# Patient Record
Sex: Female | Born: 1945 | Race: White | Hispanic: No | Marital: Married | Smoking: Former smoker
Health system: Southern US, Community
[De-identification: ages and names within clinical notes are randomized; demographics above are authoritative.]

## PROBLEM LIST (undated history)

## (undated) DIAGNOSIS — J189 Pneumonia, unspecified organism: Secondary | ICD-10-CM

## (undated) DIAGNOSIS — M1990 Unspecified osteoarthritis, unspecified site: Secondary | ICD-10-CM

## (undated) DIAGNOSIS — B029 Zoster without complications: Secondary | ICD-10-CM

## (undated) DIAGNOSIS — K219 Gastro-esophageal reflux disease without esophagitis: Secondary | ICD-10-CM

## (undated) DIAGNOSIS — Z9981 Dependence on supplemental oxygen: Secondary | ICD-10-CM

## (undated) DIAGNOSIS — J45909 Unspecified asthma, uncomplicated: Secondary | ICD-10-CM

## (undated) DIAGNOSIS — B0229 Other postherpetic nervous system involvement: Secondary | ICD-10-CM

## (undated) DIAGNOSIS — J449 Chronic obstructive pulmonary disease, unspecified: Secondary | ICD-10-CM

## (undated) DIAGNOSIS — G8929 Other chronic pain: Secondary | ICD-10-CM

## (undated) DIAGNOSIS — I10 Essential (primary) hypertension: Secondary | ICD-10-CM

## (undated) DIAGNOSIS — M545 Low back pain, unspecified: Secondary | ICD-10-CM

## (undated) DIAGNOSIS — E049 Nontoxic goiter, unspecified: Secondary | ICD-10-CM

## (undated) DIAGNOSIS — F419 Anxiety disorder, unspecified: Secondary | ICD-10-CM

## (undated) HISTORY — PX: CATARACT EXTRACTION W/ INTRAOCULAR LENS  IMPLANT, BILATERAL: SHX1307

## (undated) HISTORY — PX: TUBAL LIGATION: SHX77

## (undated) HISTORY — PX: WISDOM TOOTH EXTRACTION: SHX21

## (undated) HISTORY — DX: Chronic obstructive pulmonary disease, unspecified: J44.9

## (undated) HISTORY — PX: TONSILLECTOMY: SUR1361

## (undated) HISTORY — DX: Nontoxic goiter, unspecified: E04.9

## (undated) HISTORY — DX: Anxiety disorder, unspecified: F41.9

## (undated) HISTORY — DX: Essential (primary) hypertension: I10

---

## 2004-08-31 ENCOUNTER — Ambulatory Visit: Payer: Self-pay | Admitting: Internal Medicine

## 2006-10-11 ENCOUNTER — Ambulatory Visit: Payer: Self-pay | Admitting: Endocrinology

## 2006-10-14 ENCOUNTER — Ambulatory Visit: Payer: Self-pay | Admitting: Endocrinology

## 2006-10-29 ENCOUNTER — Ambulatory Visit: Payer: Self-pay | Admitting: Endocrinology

## 2006-11-13 ENCOUNTER — Encounter: Admission: RE | Admit: 2006-11-13 | Discharge: 2006-11-13 | Payer: Self-pay | Admitting: Endocrinology

## 2006-11-14 ENCOUNTER — Ambulatory Visit: Payer: Self-pay | Admitting: Endocrinology

## 2006-11-14 LAB — CONVERTED CEMR LAB
BUN: 16 mg/dL (ref 6–23)
CO2: 28 meq/L (ref 19–32)
Calcium: 9.7 mg/dL (ref 8.4–10.5)
Chloride: 100 meq/L (ref 96–112)
Glucose, Bld: 97 mg/dL (ref 70–99)

## 2006-11-20 ENCOUNTER — Ambulatory Visit: Payer: Self-pay | Admitting: Endocrinology

## 2006-11-25 ENCOUNTER — Other Ambulatory Visit: Admission: RE | Admit: 2006-11-25 | Discharge: 2006-11-25 | Payer: Self-pay | Admitting: Interventional Radiology

## 2006-11-25 ENCOUNTER — Encounter (INDEPENDENT_AMBULATORY_CARE_PROVIDER_SITE_OTHER): Payer: Self-pay | Admitting: Specialist

## 2006-11-25 ENCOUNTER — Encounter: Admission: RE | Admit: 2006-11-25 | Discharge: 2006-11-25 | Payer: Self-pay | Admitting: Endocrinology

## 2006-11-27 ENCOUNTER — Ambulatory Visit: Payer: Self-pay | Admitting: Endocrinology

## 2006-12-03 ENCOUNTER — Ambulatory Visit: Payer: Self-pay | Admitting: Internal Medicine

## 2006-12-05 ENCOUNTER — Ambulatory Visit: Payer: Self-pay | Admitting: Internal Medicine

## 2006-12-17 ENCOUNTER — Ambulatory Visit: Payer: Self-pay | Admitting: Internal Medicine

## 2007-05-16 ENCOUNTER — Ambulatory Visit: Payer: Self-pay | Admitting: Internal Medicine

## 2007-06-10 ENCOUNTER — Ambulatory Visit: Payer: Self-pay | Admitting: Internal Medicine

## 2007-07-04 ENCOUNTER — Ambulatory Visit: Payer: Self-pay | Admitting: Endocrinology

## 2007-07-04 LAB — CONVERTED CEMR LAB
Basophils Absolute: 0 10*3/uL (ref 0.0–0.1)
Chloride: 100 meq/L (ref 96–112)
Creatinine, Ser: 0.9 mg/dL (ref 0.4–1.2)
Eosinophils Absolute: 0.2 10*3/uL (ref 0.0–0.6)
Eosinophils Relative: 2.2 % (ref 0.0–5.0)
Glucose, Bld: 107 mg/dL — ABNORMAL HIGH (ref 70–99)
HCT: 36.6 % (ref 36.0–46.0)
Hemoglobin: 12.8 g/dL (ref 12.0–15.0)
MCHC: 35 g/dL (ref 30.0–36.0)
MCV: 92.4 fL (ref 78.0–100.0)
Monocytes Absolute: 0.9 10*3/uL — ABNORMAL HIGH (ref 0.2–0.7)
Neutrophils Relative %: 73 % (ref 43.0–77.0)
Potassium: 3.5 meq/L (ref 3.5–5.1)
RBC: 3.96 M/uL (ref 3.87–5.11)
RDW: 11.8 % (ref 11.5–14.6)
Rhuematoid fact SerPl-aCnc: <20 intl units/mL — ABNORMAL LOW (ref 0.0–20.0)
Sodium: 137 meq/L (ref 135–145)
Total CK: 171 units/L (ref 7–177)
WBC: 7.7 10*3/uL (ref 4.5–10.5)

## 2007-07-07 ENCOUNTER — Encounter: Payer: Self-pay | Admitting: Internal Medicine

## 2007-07-07 ENCOUNTER — Ambulatory Visit: Payer: Self-pay | Admitting: Internal Medicine

## 2007-07-07 ENCOUNTER — Observation Stay (HOSPITAL_COMMUNITY): Admission: AD | Admit: 2007-07-07 | Discharge: 2007-07-08 | Payer: Self-pay | Admitting: Internal Medicine

## 2007-07-07 DIAGNOSIS — I10 Essential (primary) hypertension: Secondary | ICD-10-CM | POA: Insufficient documentation

## 2007-07-07 DIAGNOSIS — J449 Chronic obstructive pulmonary disease, unspecified: Secondary | ICD-10-CM

## 2007-07-11 ENCOUNTER — Ambulatory Visit: Payer: Self-pay | Admitting: Internal Medicine

## 2007-07-11 ENCOUNTER — Encounter: Payer: Self-pay | Admitting: Internal Medicine

## 2007-07-11 DIAGNOSIS — R0989 Other specified symptoms and signs involving the circulatory and respiratory systems: Secondary | ICD-10-CM

## 2007-07-11 DIAGNOSIS — R0609 Other forms of dyspnea: Secondary | ICD-10-CM | POA: Insufficient documentation

## 2007-07-11 DIAGNOSIS — M62838 Other muscle spasm: Secondary | ICD-10-CM

## 2007-07-24 ENCOUNTER — Ambulatory Visit: Payer: Self-pay | Admitting: Internal Medicine

## 2007-07-24 ENCOUNTER — Encounter: Payer: Self-pay | Admitting: Internal Medicine

## 2007-07-24 DIAGNOSIS — L501 Idiopathic urticaria: Secondary | ICD-10-CM

## 2007-08-05 ENCOUNTER — Ambulatory Visit: Payer: Self-pay | Admitting: Internal Medicine

## 2007-08-05 LAB — CONVERTED CEMR LAB
BUN: 11 mg/dL (ref 6–23)
CO2: 27 meq/L (ref 19–32)
Chloride: 103 meq/L (ref 96–112)
Creatinine, Ser: 0.8 mg/dL (ref 0.4–1.2)
TSH: 0.4 microintl units/mL (ref 0.35–5.50)

## 2007-08-15 ENCOUNTER — Encounter: Payer: Self-pay | Admitting: Internal Medicine

## 2007-08-15 ENCOUNTER — Ambulatory Visit: Payer: Self-pay | Admitting: Internal Medicine

## 2007-11-27 ENCOUNTER — Ambulatory Visit: Payer: Self-pay | Admitting: Internal Medicine

## 2007-11-27 DIAGNOSIS — J069 Acute upper respiratory infection, unspecified: Secondary | ICD-10-CM | POA: Insufficient documentation

## 2008-01-20 ENCOUNTER — Telehealth: Payer: Self-pay | Admitting: Internal Medicine

## 2008-01-26 ENCOUNTER — Telehealth: Payer: Self-pay | Admitting: Internal Medicine

## 2008-02-18 ENCOUNTER — Ambulatory Visit: Payer: Self-pay | Admitting: Internal Medicine

## 2008-02-18 LAB — CONVERTED CEMR LAB
CO2: 27 meq/L (ref 19–32)
Chloride: 110 meq/L (ref 96–112)
Glucose, Bld: 88 mg/dL (ref 70–99)
Potassium: 3.9 meq/L (ref 3.5–5.1)
Sodium: 143 meq/L (ref 135–145)
TSH: 0.69 microintl units/mL (ref 0.35–5.50)

## 2008-02-20 ENCOUNTER — Encounter: Payer: Self-pay | Admitting: Internal Medicine

## 2008-02-23 LAB — CONVERTED CEMR LAB: Vit D, 1,25-Dihydroxy: 9 — ABNORMAL LOW (ref 30–89)

## 2008-02-26 ENCOUNTER — Ambulatory Visit: Payer: Self-pay | Admitting: Internal Medicine

## 2008-02-26 DIAGNOSIS — J309 Allergic rhinitis, unspecified: Secondary | ICD-10-CM | POA: Insufficient documentation

## 2008-05-12 ENCOUNTER — Ambulatory Visit: Payer: Self-pay | Admitting: Internal Medicine

## 2008-05-12 DIAGNOSIS — R209 Unspecified disturbances of skin sensation: Secondary | ICD-10-CM | POA: Insufficient documentation

## 2008-06-10 ENCOUNTER — Ambulatory Visit: Payer: Self-pay | Admitting: Internal Medicine

## 2008-06-10 DIAGNOSIS — E559 Vitamin D deficiency, unspecified: Secondary | ICD-10-CM

## 2008-06-14 ENCOUNTER — Telehealth: Payer: Self-pay | Admitting: Internal Medicine

## 2008-08-24 ENCOUNTER — Encounter: Payer: Self-pay | Admitting: Internal Medicine

## 2008-08-31 ENCOUNTER — Ambulatory Visit: Payer: Self-pay | Admitting: Internal Medicine

## 2008-08-31 LAB — CONVERTED CEMR LAB
CO2: 26 meq/L (ref 19–32)
Chloride: 103 meq/L (ref 96–112)
Creatinine, Ser: 1.4 mg/dL — ABNORMAL HIGH (ref 0.4–1.2)
GFR calc non Af Amer: 41 mL/min
Potassium: 4.1 meq/L (ref 3.5–5.1)
Sodium: 137 meq/L (ref 135–145)
Vit D, 1,25-Dihydroxy: 44 (ref 30–89)

## 2008-09-07 ENCOUNTER — Ambulatory Visit: Payer: Self-pay | Admitting: Internal Medicine

## 2008-09-07 DIAGNOSIS — J42 Unspecified chronic bronchitis: Secondary | ICD-10-CM | POA: Insufficient documentation

## 2008-09-07 DIAGNOSIS — B370 Candidal stomatitis: Secondary | ICD-10-CM

## 2008-09-21 ENCOUNTER — Ambulatory Visit: Payer: Self-pay | Admitting: Internal Medicine

## 2008-09-21 ENCOUNTER — Telehealth: Payer: Self-pay | Admitting: Internal Medicine

## 2008-09-29 ENCOUNTER — Telehealth: Payer: Self-pay | Admitting: Internal Medicine

## 2008-09-30 ENCOUNTER — Encounter (INDEPENDENT_AMBULATORY_CARE_PROVIDER_SITE_OTHER): Payer: Self-pay | Admitting: *Deleted

## 2008-10-04 ENCOUNTER — Encounter: Payer: Self-pay | Admitting: Internal Medicine

## 2008-10-04 ENCOUNTER — Ambulatory Visit: Payer: Self-pay | Admitting: Internal Medicine

## 2008-10-04 ENCOUNTER — Telehealth (INDEPENDENT_AMBULATORY_CARE_PROVIDER_SITE_OTHER): Payer: Self-pay | Admitting: *Deleted

## 2008-10-04 DIAGNOSIS — R05 Cough: Secondary | ICD-10-CM

## 2008-10-04 DIAGNOSIS — J209 Acute bronchitis, unspecified: Secondary | ICD-10-CM

## 2008-10-05 ENCOUNTER — Encounter: Payer: Self-pay | Admitting: Internal Medicine

## 2008-10-20 ENCOUNTER — Ambulatory Visit: Payer: Self-pay | Admitting: Internal Medicine

## 2008-10-20 DIAGNOSIS — E042 Nontoxic multinodular goiter: Secondary | ICD-10-CM

## 2008-11-08 ENCOUNTER — Encounter: Admission: RE | Admit: 2008-11-08 | Discharge: 2008-11-08 | Payer: Self-pay | Admitting: Internal Medicine

## 2008-11-11 ENCOUNTER — Telehealth: Payer: Self-pay | Admitting: Internal Medicine

## 2008-11-15 ENCOUNTER — Encounter (INDEPENDENT_AMBULATORY_CARE_PROVIDER_SITE_OTHER): Payer: Self-pay | Admitting: *Deleted

## 2008-11-22 ENCOUNTER — Ambulatory Visit: Payer: Self-pay | Admitting: Internal Medicine

## 2008-11-25 ENCOUNTER — Encounter: Payer: Self-pay | Admitting: Internal Medicine

## 2008-11-26 ENCOUNTER — Telehealth: Payer: Self-pay | Admitting: Internal Medicine

## 2008-12-02 ENCOUNTER — Ambulatory Visit: Payer: Self-pay | Admitting: Endocrinology

## 2008-12-02 DIAGNOSIS — R0602 Shortness of breath: Secondary | ICD-10-CM

## 2008-12-06 ENCOUNTER — Encounter: Payer: Self-pay | Admitting: Internal Medicine

## 2008-12-07 ENCOUNTER — Ambulatory Visit: Payer: Self-pay | Admitting: Internal Medicine

## 2008-12-10 ENCOUNTER — Telehealth: Payer: Self-pay | Admitting: Internal Medicine

## 2008-12-13 ENCOUNTER — Telehealth: Payer: Self-pay | Admitting: Internal Medicine

## 2008-12-13 ENCOUNTER — Ambulatory Visit: Payer: Self-pay | Admitting: Internal Medicine

## 2008-12-13 LAB — CONVERTED CEMR LAB
Chloride: 103 meq/L (ref 96–112)
Creatinine, Ser: 0.9 mg/dL (ref 0.4–1.2)
GFR calc non Af Amer: 68 mL/min
Sodium: 139 meq/L (ref 135–145)
TSH: 1.93 microintl units/mL (ref 0.35–5.50)

## 2008-12-17 ENCOUNTER — Encounter: Admission: RE | Admit: 2008-12-17 | Discharge: 2008-12-17 | Payer: Self-pay | Admitting: Otolaryngology

## 2008-12-22 ENCOUNTER — Encounter: Payer: Self-pay | Admitting: Internal Medicine

## 2008-12-29 ENCOUNTER — Ambulatory Visit (HOSPITAL_COMMUNITY): Admission: RE | Admit: 2008-12-29 | Discharge: 2008-12-30 | Payer: Self-pay | Admitting: Otolaryngology

## 2008-12-29 ENCOUNTER — Encounter (INDEPENDENT_AMBULATORY_CARE_PROVIDER_SITE_OTHER): Payer: Self-pay | Admitting: Otolaryngology

## 2008-12-29 HISTORY — PX: THYROIDECTOMY: SHX17

## 2009-01-12 ENCOUNTER — Ambulatory Visit: Payer: Self-pay | Admitting: Internal Medicine

## 2009-01-12 LAB — CONVERTED CEMR LAB: TSH: 1.27 microintl units/mL (ref 0.35–5.50)

## 2009-01-31 ENCOUNTER — Ambulatory Visit: Payer: Self-pay | Admitting: Cardiology

## 2009-01-31 ENCOUNTER — Inpatient Hospital Stay (HOSPITAL_COMMUNITY): Admission: EM | Admit: 2009-01-31 | Discharge: 2009-02-04 | Payer: Self-pay | Admitting: *Deleted

## 2009-01-31 ENCOUNTER — Ambulatory Visit: Payer: Self-pay | Admitting: Internal Medicine

## 2009-02-02 ENCOUNTER — Encounter: Payer: Self-pay | Admitting: Internal Medicine

## 2009-02-03 ENCOUNTER — Encounter: Payer: Self-pay | Admitting: Internal Medicine

## 2009-02-07 ENCOUNTER — Encounter: Payer: Self-pay | Admitting: Internal Medicine

## 2009-02-15 ENCOUNTER — Ambulatory Visit: Payer: Self-pay | Admitting: Internal Medicine

## 2009-02-15 ENCOUNTER — Encounter: Payer: Self-pay | Admitting: Internal Medicine

## 2009-02-17 ENCOUNTER — Telehealth (INDEPENDENT_AMBULATORY_CARE_PROVIDER_SITE_OTHER): Payer: Self-pay | Admitting: *Deleted

## 2009-02-18 ENCOUNTER — Encounter: Payer: Self-pay | Admitting: Internal Medicine

## 2009-02-21 ENCOUNTER — Telehealth (INDEPENDENT_AMBULATORY_CARE_PROVIDER_SITE_OTHER): Payer: Self-pay | Admitting: *Deleted

## 2009-03-04 ENCOUNTER — Ambulatory Visit: Payer: Self-pay | Admitting: Internal Medicine

## 2009-03-28 ENCOUNTER — Encounter: Payer: Self-pay | Admitting: Adult Health

## 2009-03-28 ENCOUNTER — Ambulatory Visit: Payer: Self-pay | Admitting: Internal Medicine

## 2009-04-25 ENCOUNTER — Ambulatory Visit: Payer: Self-pay | Admitting: Internal Medicine

## 2009-04-26 LAB — CONVERTED CEMR LAB
BUN: 14 mg/dL (ref 6–23)
Creatinine, Ser: 0.8 mg/dL (ref 0.4–1.2)
Eosinophils Relative: 8.5 % — ABNORMAL HIGH (ref 0.0–5.0)
GFR calc non Af Amer: 77.31 mL/min (ref >60)
HCT: 42.2 % (ref 36.0–46.0)
Monocytes Relative: 7.6 % (ref 3.0–12.0)
Neutrophils Relative %: 70 % (ref 43.0–77.0)
Platelets: 314 10*3/uL (ref 150.0–400.0)
Potassium: 3.5 meq/L (ref 3.5–5.1)
Pro B Natriuretic peptide (BNP): 27 pg/mL (ref 0.0–100.0)
RBC: 4.52 M/uL (ref 3.87–5.11)
TSH: 0.87 microintl units/mL (ref 0.35–5.50)
WBC: 8.6 10*3/uL (ref 4.5–10.5)

## 2009-05-10 ENCOUNTER — Ambulatory Visit: Payer: Self-pay | Admitting: Internal Medicine

## 2009-05-10 ENCOUNTER — Encounter: Payer: Self-pay | Admitting: Adult Health

## 2009-06-13 ENCOUNTER — Encounter (HOSPITAL_COMMUNITY): Admission: RE | Admit: 2009-06-13 | Discharge: 2009-09-11 | Payer: Self-pay | Admitting: Internal Medicine

## 2009-06-27 ENCOUNTER — Ambulatory Visit: Payer: Self-pay | Admitting: Internal Medicine

## 2009-09-05 ENCOUNTER — Telehealth (INDEPENDENT_AMBULATORY_CARE_PROVIDER_SITE_OTHER): Payer: Self-pay | Admitting: *Deleted

## 2009-09-10 ENCOUNTER — Ambulatory Visit: Payer: Self-pay | Admitting: Family Medicine

## 2009-09-12 ENCOUNTER — Encounter (HOSPITAL_COMMUNITY): Admission: RE | Admit: 2009-09-12 | Discharge: 2009-10-11 | Payer: Self-pay | Admitting: Internal Medicine

## 2009-09-15 ENCOUNTER — Telehealth: Payer: Self-pay | Admitting: Internal Medicine

## 2009-09-15 ENCOUNTER — Ambulatory Visit: Payer: Self-pay | Admitting: Internal Medicine

## 2009-09-15 LAB — CONVERTED CEMR LAB
Eosinophils Relative: 0.1 % (ref 0.0–5.0)
GFR calc non Af Amer: 59.68 mL/min (ref >60)
HCT: 44.4 % (ref 36.0–46.0)
Hemoglobin: 15.1 g/dL — ABNORMAL HIGH (ref 12.0–15.0)
Lymphs Abs: 0.8 10*3/uL (ref 0.7–4.0)
Monocytes Relative: 1.3 % — ABNORMAL LOW (ref 3.0–12.0)
Neutro Abs: 12.7 10*3/uL — ABNORMAL HIGH (ref 1.4–7.7)
Potassium: 4.3 meq/L (ref 3.5–5.1)
RBC: 4.56 M/uL (ref 3.87–5.11)
Sodium: 140 meq/L (ref 135–145)
TSH: 0.28 microintl units/mL — ABNORMAL LOW (ref 0.35–5.50)
WBC: 13.7 10*3/uL — ABNORMAL HIGH (ref 4.5–10.5)

## 2009-10-26 ENCOUNTER — Telehealth: Payer: Self-pay | Admitting: Family Medicine

## 2009-12-28 ENCOUNTER — Ambulatory Visit: Payer: Self-pay | Admitting: Internal Medicine

## 2009-12-28 LAB — CONVERTED CEMR LAB
Free T4: 0.7 ng/dL (ref 0.6–1.6)
T3, Free: 2.7 pg/mL (ref 2.3–4.2)

## 2010-02-08 ENCOUNTER — Ambulatory Visit: Payer: Self-pay | Admitting: Internal Medicine

## 2010-03-02 ENCOUNTER — Encounter: Payer: Self-pay | Admitting: Internal Medicine

## 2010-04-20 ENCOUNTER — Telehealth (INDEPENDENT_AMBULATORY_CARE_PROVIDER_SITE_OTHER): Payer: Self-pay | Admitting: *Deleted

## 2010-05-09 ENCOUNTER — Ambulatory Visit: Payer: Self-pay | Admitting: Internal Medicine

## 2010-05-16 ENCOUNTER — Ambulatory Visit: Payer: Self-pay | Admitting: Internal Medicine

## 2010-05-16 LAB — CONVERTED CEMR LAB
ALT: 29 units/L (ref 0–35)
AST: 22 units/L (ref 0–37)
Alkaline Phosphatase: 59 units/L (ref 39–117)
BUN: 30 mg/dL — ABNORMAL HIGH (ref 6–23)
Bilirubin, Direct: 0.1 mg/dL (ref 0.0–0.3)
Direct LDL: 108 mg/dL
Eosinophils Relative: 1.9 % (ref 0.0–5.0)
GFR calc non Af Amer: 30.22 mL/min (ref >60)
HCT: 35.8 % — ABNORMAL LOW (ref 36.0–46.0)
Leukocytes, UA: NEGATIVE
Lymphs Abs: 1.6 10*3/uL (ref 0.7–4.0)
Monocytes Relative: 8.2 % (ref 3.0–12.0)
Neutrophils Relative %: 71.3 % (ref 43.0–77.0)
Platelets: 350 10*3/uL (ref 150.0–400.0)
Potassium: 3.9 meq/L (ref 3.5–5.1)
Sodium: 137 meq/L (ref 135–145)
Specific Gravity, Urine: 1.01 (ref 1.000–1.030)
Total Bilirubin: 0.4 mg/dL (ref 0.3–1.2)
Total CHOL/HDL Ratio: 3
Urobilinogen, UA: 0.2 (ref 0.0–1.0)
VLDL: 57.2 mg/dL — ABNORMAL HIGH (ref 0.0–40.0)
WBC: 8.9 10*3/uL (ref 4.5–10.5)

## 2010-05-23 ENCOUNTER — Encounter: Payer: Self-pay | Admitting: Internal Medicine

## 2010-05-23 ENCOUNTER — Ambulatory Visit: Payer: Self-pay | Admitting: Internal Medicine

## 2010-07-07 ENCOUNTER — Ambulatory Visit: Payer: Self-pay | Admitting: Internal Medicine

## 2010-07-20 ENCOUNTER — Telehealth: Payer: Self-pay | Admitting: Internal Medicine

## 2010-08-08 ENCOUNTER — Telehealth (INDEPENDENT_AMBULATORY_CARE_PROVIDER_SITE_OTHER): Payer: Self-pay | Admitting: *Deleted

## 2010-08-25 ENCOUNTER — Telehealth (INDEPENDENT_AMBULATORY_CARE_PROVIDER_SITE_OTHER): Payer: Self-pay | Admitting: *Deleted

## 2010-10-05 ENCOUNTER — Ambulatory Visit: Payer: Self-pay | Admitting: Internal Medicine

## 2010-10-05 DIAGNOSIS — H269 Unspecified cataract: Secondary | ICD-10-CM | POA: Insufficient documentation

## 2010-10-05 DIAGNOSIS — J9611 Chronic respiratory failure with hypoxia: Secondary | ICD-10-CM

## 2010-10-30 ENCOUNTER — Encounter: Payer: Self-pay | Admitting: Internal Medicine

## 2010-10-30 ENCOUNTER — Ambulatory Visit
Admission: RE | Admit: 2010-10-30 | Discharge: 2010-10-30 | Payer: Self-pay | Source: Home / Self Care | Attending: Internal Medicine | Admitting: Internal Medicine

## 2010-11-03 IMAGING — CR DG CHEST 1V PORT
1 series · 1 of 1 positions shown · non-contrast
Comparison: 11/22/2008

CLINICAL DATA: Postop for thyroid mass.

PORTABLE CHEST - 1 VIEW

[AP]
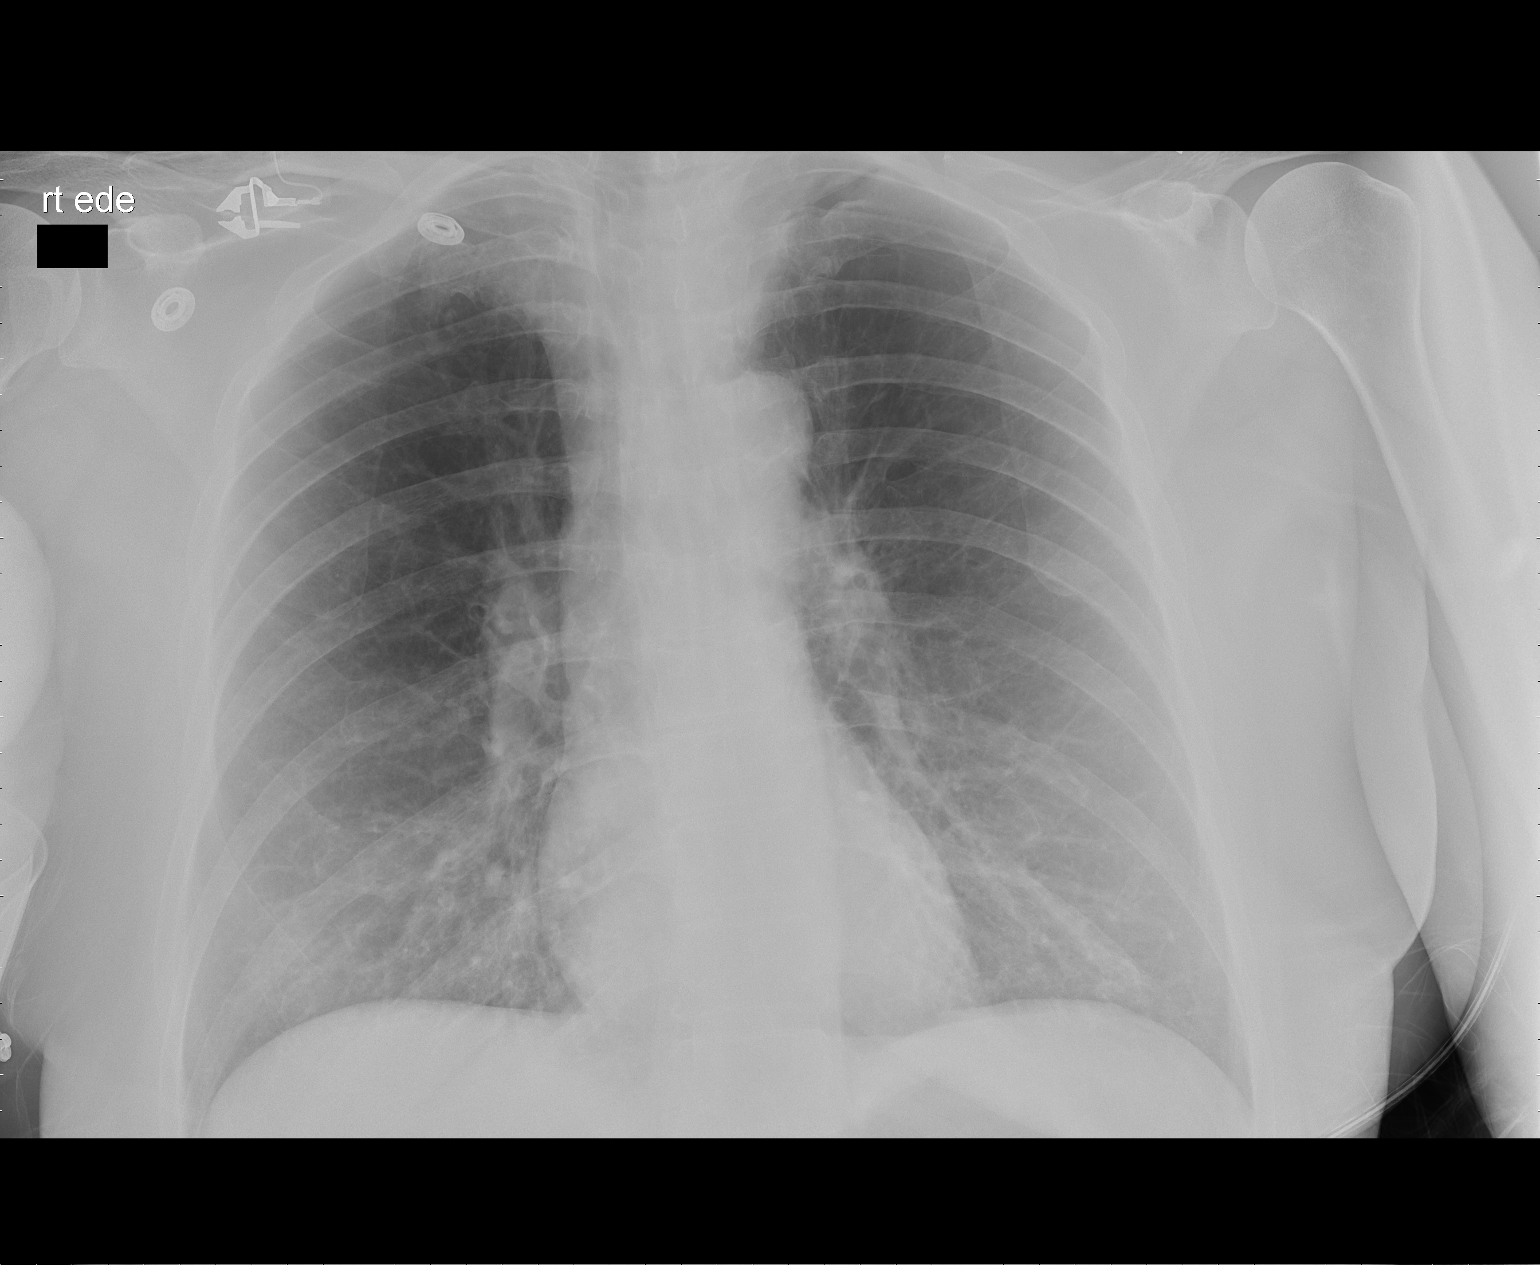

[1 of 1 positions shown; findings below may reference images not displayed]

FINDINGS: Left paratracheal fullness is less prominent than noted
on prior exam.  Less tracheal deviation to the right.
COPD/emphysema.  No acute cardiopulmonary findings.
IMPRESSION: Decrease in left paratracheal mass effect.  COPD/emphysema.  No
acute findings.

## 2010-11-09 ENCOUNTER — Ambulatory Visit
Admission: RE | Admit: 2010-11-09 | Discharge: 2010-11-09 | Payer: Self-pay | Source: Home / Self Care | Attending: Internal Medicine | Admitting: Internal Medicine

## 2010-11-14 NOTE — Progress Notes (Signed)
Summary: refill clotrimazole  Phone Note Refill Request Message from:  [facility] on August 25, 2010 3:50 PM  Refills Requested: Medication #1:  CLOTRIMAZOLE 10MG  TROCHE   Last Refilled: 06/28/2010 Pt requeted rx refill-please advise if okay to refill   Method Requested: Electronic Initial call taken by: Reynaldo Minium CMA,  August 25, 2010 3:52 PM  Follow-up for Phone Call        ok Follow-up by: Nyoka Cowden MD,  August 25, 2010 4:13 PM    New/Updated Medications: CLOTRIMAZOLE 10 MG TROC (CLOTRIMAZOLE) 1 by mouth 5 times per day x 7 days Prescriptions: CLOTRIMAZOLE 10 MG TROC (CLOTRIMAZOLE) 1 by mouth 5 times per day x 7 days  #35 x 0   Entered by:   Vernie Murders   Authorized by:   Nyoka Cowden MD   Signed by:   Vernie Murders on 08/25/2010   Method used:   [facility]        [facility]  [address] [id number]       [address]       [place], [place]  [address]       Ph: [phone] or [phone]       Fax: [phone]   RxID:   [id number]

## 2010-11-14 NOTE — Assessment & Plan Note (Signed)
Summary: Pulmonary/ ext ov with teaching add qvar 80 and pepcid at hs   Copy to:  DR. Posey Rea Primary Provider/Referring Provider:  Tresa Garter MD  CC:  Acute visit.  Pt c/o increased SOB x 2 days.  She states that she gets out of breath walking from room to room at home and just making her bed.  She also c/o dry cough getting worse this past wk..  History of Present Illness: 8  yowf  who quit smoking in December 2007, with copd with minimum asthmatic component   baseline = grocery store ok, not mall or superstore not needing handicap parking.  Feb 15, 2009 ov c/o sob onset Feb 8th ? goiter > goiter surgery  12/29/08 no better then ended back in the hosp 4/19 - 02/11/2009    better for the last week prior to ov in terms of doe on symbicort and prednsione taper, and   dry raspy cough and hoarseness come and go without pattern. Symbicort 2 puffs first thing  in am and 2 puffs again in pm about 12 hours later chase the am dose with spiriva if short of cough or coughing ok to use xopenex or neb every 4 hours Taper off prednsione as previously instructed.  May 10, 2009- Still on prednisone 20mg  . she has a ceiling of 20mg  and floor of 5 mg.   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 Had been able to participate in rehab and maintain on prednisone 10 onehalf daily and rare need for rescue.  Since worse,  increased prednsione 10 2 daily x one week but no better and therefore increased to 4 daily 11/28 and now yellow nasal drainage. rec doxy and work on [employer].  December 28, 2009 Acute visit.  Pt c/o increased SOB x 2 days.  She states that she gets out of breath walking from room to room at home and just making her bed.  She also c/o dry cough getting worse this past wk. Has found that pred taper to 10 mg per day tend to flare and now back up to 20 mg x 2 days. no purulent sputum.  Pt denies any significant sore  throat, dysphagia, itching, sneezing,  nasal congestion or excess secretions,  fever, chills, sweats, unintended wt loss, pleuritic or exertional cp, hempoptysis, change in activity tolerance  orthopnea pnd or leg swelling.  Pt also denies any obvious fluctuation in symptoms with weather or environmental change or other alleviating or aggravating factors.            Current Medications (verified): 1)  Spiriva Handihaler 18 Mcg  Caps (Tiotropium Bromide Monohydrate) .... Inhale Contents of 1 Capsule Once A Day 2)  Symbicort 160-4.5 Mcg/act Aero (Budesonide-Formoterol Fumarate) .... 2 Puffs First Thing  in Am and 2 Puffs Again in Pm About 12 Hours Later 3)  Vitamin D3 1000 Unit  Tabs (Cholecalciferol) .Marland Kitchen.. 1 By Mouth Daily 4)  Calcium Carbonate-Vitamin D 600-400 Mg-Unit  Tabs (Calcium Carbonate-Vitamin D) .... Take 1 Tablet By Mouth Two Times A Day 5)  Azor 5-40 Mg  Tabs (Amlodipine-Olmesartan) .Marland Kitchen.. 1 By Mouth Daily 6)  Prednisone 10 Mg  Tabs (Prednisone) .... Take As Directed or See Below For Tapering Instructions 7)  Omeprazole 40 Mg Cpdr (Omeprazole) .... Take One 30-60 Min Before First and Last Meals of The Day 8)  Albuterol Sulfate (2.5 Mg/35ml) 0.083%  Nebu (Albuterol Sulfate) .... One Every 4-6 Hours If Needed For An Emergency 9)  Proair Hfa 108 [phone] Base) Mcg/act  Aers (Albuterol Sulfate) .... 2 Puffs Every 4-6 Hours As Needed 10)  Oxygen .... 1.5 Liters As Needed 11)  Prednisone 20 Mg Tabs (Prednisone) .... 2 Tabs By Mouth in Am. 12)  Mucus Relief 400 Mg Tabs (Guaifenesin) .Marland Kitchen.. 1 Two Times A Day 13)  Hydrochlorothiazide 50 Mg Tabs (Hydrochlorothiazide) .... One Daily  Allergies (verified): No Known Drug Allergies  Past History:  Past Medical History: COPD    - PFT's 12/07/08  FEV1 1.26(61%) ratio 42,  5% resp to B2    - PFT's 02/03/09  FEV1 .88  (38%) ratio 40     - HFA technique 75% Mar 04, 2009 >90% April 25, 2009 > 90% December 28, 2009     - Prednsone maint April 25, 2009     -  Add on Qva 80 2 puffs first thing  in am and 2 puffs again in pm about 12 hours later  December 28, 2009  Hypertension Hives 07-2007 Allergic rhinitis    - Sinus ct 01/2009 wnl Goiter 2009 > resected 12/29/08 Health Maintenance......................................[facility] [place]     - Pneumovax September 15, 2009   Vital Signs:  Patient profile:   65 year old female Weight:      162 pounds O2 Sat:      95 % on Room air Temp:     97.9 degrees F oral Pulse rate:   99 / minute BP sitting:   118 / 64  (left arm)  Vitals Entered By: Vernie Murders (December 28, 2009 10:24 AM)  O2 Flow:  Room air  Physical Exam  Additional Exam:   ambulatory wf nad mininmally cushingnoid  wt 144 April 25, 2009 > 149 June 27, 2009  149 September 15, 2009 > 162 December 28, 2009  HEENT:edentullous, nl turbinates, Post pharynx w/ scattered white patches.  Nl external ear canals without cough reflex NECK :  without JVD/Nodes/TM/ nl carotid upstrokes bilaterally LUNGS: barrel chest, marked increased t exp CV:  RRR  no s3 or murmur or increase in P2, no edema   ABD:  soft and nontender with nl excursion in the supine position. No bruits or organomegaly, bowel sounds nl MS:  warm without deformities, calf tenderness, cyanosis or clubbing         Impression & Recommendations:  Problem # 1:  COPD (ICD-496)  Marked variability of fev1 documented previously indicating asthmatic component with variable Insp Capacity from air trapping translating to difficulty with hfa technique.  May eventually need to consider change to Brovana and Budesonide, the equivalent of Symbicort, per neb.    I spent extra time with the patient today explaining optimal mdi  technique.  This improved from  50-75% so tvar80 as add onry q  Appears prednisone dep for now as well.  The goal with a chronic steroid dependent illness is always arriving at the lowest effective dose that controls the disease/symptoms and not accepting a set  "formula" which is based on statistics that don't take into accound individual variability or the natural hx of the dz in every individual patient, which may well vary over time. For now ceiling is 20 and floor is 5mg  per day   Each maintenance medication was reviewed in detail including most importantly the difference between maintenance and as needed and under what circumstances the prns are to be used.  This was  done in the context of a medication calendar review which provided the patient with a user-friendly unambiguous mechanism for medication administration and reconciliation and provides an action plan for all active problems. It is critical that this be shown to every doctor  for modification during the office visit if necessary so the patient can use it as a working document.  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 2 puffs first thing  in am and 2 puffs again in pm about 12 hours later 2)  Pepcid Ac Maximum Strength 20 Mg Tabs (Famotidine) .... One at bedtime  Other Orders: TLB-TSH (Thyroid Stimulating Hormone) (84443-TSH) TLB-T3, Free (Triiodothyronine) (84481-T3FREE) TLB-T4 (Thyrox), Free [phone]) TLB-T3 Uptake (84479-T3UP) Est. Patient Level IV (66440)  Patient Instructions: 1)  Add pepcid 20 mg one at bedtime 2)  Work on perfecting inhaler technique:  relax and blow all the  way out then take a nice smooth deep breath back in, triggering the inhaler at same time you start breathing in  and hold for few secs 3)  Qvar 80 2 puffs first thing  in am and 2 puffs again in pm about 12 hours later  4)  Please schedule a follow-up appointment in 4- 6 weeks with pfts

## 2010-11-14 NOTE — Miscellaneous (Signed)
Summary: Orders Update pft charges  Clinical Lists Changes  Orders: Added new Service order of Carbon Monoxide diffusing w/capacity (94720) - Signed Added new Service order of Lung Volumes (94240) - Signed Added new Service order of Spirometry (Pre & Post) (94060) - Signed 

## 2010-11-14 NOTE — Progress Notes (Signed)
Summary: RX Proair  Phone Note Refill Request Call back at [phone] Message [facility]:  [facility] [address] on October 26, 2009 10:39 AM  Refills Requested: Medication #1:  PROAIR HFA 108 (90 BASE) MCG/ACT  AERS 2 puffs every 4-6 hours as needed   Last Refilled: 09/10/2009 Received faxed refill request.   Method Requested: Electronic Initial call taken by: Sydell Axon LPN,  October 26, 2009 10:40 AM  Follow-up for Phone Call        chart says this is plotnikov pt ? Follow-up by: Judith Part MD,  October 26, 2009 10:53 AM  Additional Follow-up for Phone Call Additional follow up Details #1::        Advised pharmacy. Additional Follow-up by: Lowella Petties CMA,  October 26, 2009 11:38 AM

## 2010-11-14 NOTE — Progress Notes (Signed)
Summary: Needs ov by end of month  Phone Note Call from Patient   Caller: Patient Call For: wert Summary of Call: pt's prescript are still under dr plotnikov would like dr wert to fill spiriva and azor seeing she no longer see dr Posey Rea [place] Initial call taken by: Rickard Patience,  April 20, 2010 3:18 PM  Follow-up for Phone Call        Spoke with pt.  Advised that we can refill her spiriva but her blood pressure med needs to be filled by her PCP.  She states that Dr Perley Jain is wanting her to come in for cpx for refills, looks like she last saw him Feb 2010.  She feels that she does not need to have appt with her PCP for refills "with all I have been through with Dr Sherene Sires".  I advised that MW is her pulm dr and does not follow her BP.  She then argued that we check her BP every time she has appt here and does not understand why we can't refill for her.  Please advise thanks! Follow-up by: Vernie Murders,  April 20, 2010 3:33 PM  Additional Follow-up for Phone Call Additional follow up Details #1::        best option is schedule ov with Tammy or me and give her enough rx to last until then and we can discuss it then but I can't refill this type med without addressing more than just cuff pressure (that wouldn't be quality care) - My notes say she was due to see Tammy by now anyway so that would be another option Additional Follow-up by: Nyoka Cowden MD,  April 20, 2010 4:16 PM    Additional Follow-up for Phone Call Additional follow up Details #2::    Pt already has appt with TP for 7/26. I advised that she keep this appt and this issue can be discussed at that time.  Pt verbalized understanding and states that Dr Posey Rea refilled med today so she will have enough to last until she is seen here. Rx refill for spiriva was sent. Follow-up by: Vernie Murders,  April 20, 2010 4:55 PM  Prescriptions: SPIRIVA HANDIHALER 18 MCG  CAPS (TIOTROPIUM BROMIDE MONOHYDRATE) Inhale contents of 1  capsule once a day  #30 x 5   Entered by:   Vernie Murders   Authorized by:   Nyoka Cowden MD   Signed by:   Vernie Murders on 04/20/2010   Method used:   [facility]        [facility] [id number]       [address]       [place], [place]  [address]       Ph: [phone] or [phone]       Fax: [phone]   RxID:   [id number]

## 2010-11-14 NOTE — Progress Notes (Signed)
Summary: increased SOB and cough- requests work excuse  Phone Note Call from Patient   Caller: Patient Call For: wert Summary of Call: need work note Initial call taken by: Rickard Patience,  August 08, 2010 8:14 AM  Follow-up for Phone Call        Spoke with pt.  She statest that she had to call out of work today due to having increased SOB, cough and fatigue.  Cough is dry- sometimes prod with clear sputum.  She states that her symptoms started over the w/e and so she has increased her pred to 20 mg daily and has been using her her 24/7.  She is requesting note to excuse her from work from today until 08/14/10.  I advised will need ov- she refused, stating that she is "too tired".  Would like to just have the note.  Pls advise thanks! Follow-up by: Vernie Murders,  August 08, 2010 9:22 AM  Additional Follow-up for Phone Call Additional follow up Details #1::        she has all the instructions she needs to handle a flare of her symptoms (her calendar has the action plan at the bottom which should serve as ruby slippers if she remembers my analogy)  if not effective needs ov with calendar and all active meds in hand.  ok for work note today but if not able to resume work tomorrow needs to come to office and let me troubleshoot the problem Additional Follow-up by: Nyoka Cowden MD,  August 08, 2010 9:47 AM    Additional Follow-up for Phone Call Additional follow up Details #2::    Spoke with pt and notified of the above recs per MW.  Pt does remember ruby slippers analogy and plans to follow her med cal.  She states that she does not need a work note for just being out 1 day.  She is due back to work on [employer].  I advised that if not improved by then, needs to call first thing in the am so we can get her in for appt.  Pt verbalized understanding. Follow-up by: Vernie Murders,  August 08, 2010 9:54 AM

## 2010-11-14 NOTE — Assessment & Plan Note (Signed)
Summary: Pulmomary/ ext ov with review of PFT's   Copy to:  DR. Posey Rea Primary Provider/Referring Provider:  Tresa Garter MD  CC:  4 wk followup with PFT's.  Pt states breathing is about the same- maybe some better with qvar.  She states that she is "getting over cold"- has prod cough with clear mucus.  She states that she needs to use her proair at least bid.  She is currently taking 5 mg prednisone daily.Marland Kitchen  History of Present Illness: 21  yowf  who quit smoking in December 2007, with copd with minimum asthmatic component   baseline = grocery store ok, not mall or superstore not needing handicap parking.  Feb 15, 2009 ov c/o sob onset Feb 8th ? goiter > goiter surgery  12/29/08 no better then ended back in the hosp 4/19 - 02/11/2009    better for the last week prior to ov in terms of doe on symbicort and prednsione taper, and   dry raspy cough and hoarseness come and go without pattern. Symbicort 2 puffs first thing  in am and 2 puffs again in pm about 12 hours later chase the am dose with spiriva if short of cough or coughing ok to use xopenex or neb every 4 hours Taper off prednsione as previously instructed.  May 10, 2009- Still on prednisone 20mg  . she has a ceiling of 20mg  and floor of 5 mg.   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 Had been able to participate in rehab and maintain on prednisone 10 onehalf daily and rare need for rescue.  Since worse,  increased prednsione 10 2 daily x one week but no better and therefore increased to 4 daily 11/28 and now yellow nasal drainage. rec doxy and work on [employer].  December 28, 2009 Acute visit.  Pt c/o increased SOB x 2 days.  She states that she gets out of breath walking from room to room at home and just making her bed.  She also c/o dry cough getting worse this past wk. Has found that pred taper to 10 mg per day tend to flare and now back up to 20 mg x 2  days. no purulent sputum.   February 08, 2010 4 wk followup with PFT's.  Pt states breathing is about the same- maybe some better with qvar.  She states that she is "getting over cold"- has prod cough with clear mucus.  She states that she needs to use her proair at least bid but that's better than before.  She is currently taking 5 mg prednisone daily. Pt denies any significant sore throat, dysphagia, itching, sneezing,  nasal congestion or excess secretions,  fever, chills, sweats, unintended wt los     Current Medications (verified): 1)  Spiriva Handihaler 18 Mcg  Caps (Tiotropium Bromide Monohydrate) .... Inhale Contents of 1 Capsule Once A Day 2)  Symbicort 160-4.5 Mcg/act Aero (Budesonide-Formoterol Fumarate) .... 2 Puffs First Thing  in Am and 2 Puffs Again in Pm About 12 Hours Later 3)  Vitamin D3 1000 Unit  Tabs (Cholecalciferol) .Marland Kitchen.. 1 By Mouth Daily 4)  Calcium Carbonate-Vitamin D 600-400 Mg-Unit  Tabs (Calcium Carbonate-Vitamin D) .... Take 1 Tablet By Mouth Two Times A Day 5)  Azor 5-40 Mg  Tabs (Amlodipine-Olmesartan) .Marland Kitchen.. 1 By Mouth Daily 6)  Prednisone 10 Mg  Tabs (Prednisone) .... Take As Directed or See Below For  Tapering Instructions 7)  Omeprazole 40 Mg Cpdr (Omeprazole) .... Take One 30-60 Min Before First and Last Meals of The Day 8)  Albuterol Sulfate (2.5 Mg/79ml) 0.083%  Nebu (Albuterol Sulfate) .... One Every 4-6 Hours If Needed For An Emergency 9)  Proair Hfa 108 [phone] Base) Mcg/act  Aers (Albuterol Sulfate) .... 2 Puffs Every 4-6 Hours As Needed 10)  Oxygen .... 1.5 Liters As Needed 11)  Prednisone 20 Mg Tabs (Prednisone) .... 2 Tabs By Mouth in Am. 12)  Mucus Relief 400 Mg Tabs (Guaifenesin) .Marland Kitchen.. 1 Two Times A Day 13)  Hydrochlorothiazide 50 Mg Tabs (Hydrochlorothiazide) .... One Daily 14)  Qvar 80 Mcg/act  Aers (Beclomethasone Dipropionate) .... 2 Puffs First Thing  in Am and 2 Puffs Again in Pm About 12 Hours Later 15)  Pepcid Ac Maximum Strength 20 Mg Tabs (Famotidine)  .... One At Bedtime  Allergies (verified): No Known Drug Allergies  Past History:  Past Medical History: COPD    - PFT's 12/07/08  FEV1 1.26(61%) ratio 42,  5% resp to B2    - PFT's 02/03/09  FEV1 .88  (38%) ratio 40     - PFT's 02/08/10 FEV1 1.30 (64%) ratio 45, 19% resp to B2 and DLC046% corrects to 63%    - HFA technique 75% Mar 04, 2009 >90% April 25, 2009 > 90% December 28, 2009     - 02 dep at hs since April 2010    - Prednsone maint April 25, 2009     - Add on Qva 80 2 puffs first thing  in am and 2 puffs again in pm about 12 hours later  December 28, 2009  Hypertension Hives 07-2007 Allergic rhinitis    - Sinus ct 01/2009 wnl Goiter 2009 > resected 12/29/08 Health Maintenance......................................[facility] [place]     - Pneumovax September 15, 2009   Vital Signs:  Patient profile:   65 year old female Weight:      159 pounds O2 Sat:      95 % on Room air Temp:     98.0 degrees F oral Pulse rate:   96 / minute BP sitting:   118 / 72  (left arm)  Vitals Entered By: Vernie Murders (February 08, 2010 11:40 AM)  O2 Flow:  Room air  Physical Exam  Additional Exam:   ambulatory wf nad mininmally cushingnoid  wt 144 April 25, 2009 > 149 June 27, 2009  149 September 15, 2009 > 162 December 28, 2009 > 159 February 08, 2010  HEENT:edentullous, nl turbinates, Post pharynx w/ scattered white patches.  Nl external ear canals without cough reflex NECK :  without JVD/Nodes/TM/ nl carotid upstrokes bilaterally LUNGS: barrel chest, marked increased t exp CV:  RRR  no s3 or murmur or increase in P2, no edema   ABD:  soft and nontender with nl excursion in the supine position. No bruits or organomegaly, bowel sounds nl MS:  warm without deformities, calf tenderness, cyanosis or clubbing         Impression & Recommendations:  Problem # 1:  COPD (ICD-496) Marked variability of fev1 documented previously indicating asthmatic component with variable Insp Capacity from air  trapping translating to difficulty with hfa technique> better after starting qvar 80 2 puffs first thing  in am and 2 puffs again in pm about 12 hours later   Appears prednisone dep for now as well.  The goal with a chronic steroid dependent illness is always arriving at the lowest  effective dose that controls the disease/symptoms and not accepting a set "formula" which is based on statistics that don't take into accound individual variability or the natural hx of the dz in every individual patient, which may well vary over time. For now ceiling is 20 and floor is 5mg  every other day.   Each maintenance medication was reviewed in detail including most importantly the difference between maintenance prns and under what circumstances the prns are to be used.  In addition, these two groups (for which the patient should keep up with refills) were distinguished from a third group :  meds that are used only short term with the intent to complete a course of therapy and then not refill them.  The med list was then fully reconciled and reorganized to reflect this important distinction.  Will need new med calendar to match our mar next ov  Other Orders: Est. Patient Level IV ([address])  Patient Instructions: 1)  See calendar for specific medication instructions and bring it back for each and every office visit for every healthcare provider you see.  Without it,  you may not receive the best quality medical care that we feel you deserve.  2)  Prednisone in one half every other day (even days) = floor  3)  See Tammy NP w/in 3 monthswith all your medications, even over the counter meds, separated in two separate bags, the ones you take no matter what vs the ones you stop once you feel better and take only as needed.  She will generate for you a new user friendly medication calendar that will put [place] all on the same page re: your medication use.  Prescriptions: PROAIR HFA 108 (90 BASE) MCG/ACT  AERS (ALBUTEROL SULFATE)  2 puffs every 4-6 hours as needed  #1 x 11   Entered and Authorized by:   Nyoka Cowden MD   Signed by:   Nyoka Cowden MD on 02/08/2010   Method used:   [facility]        [id number]       [address]       [place], [place]  [address]       Ph: [phone] or [phone]       Fax: [phone]   RxID:   [id number] QVAR 80 MCG/ACT  AERS (BECLOMETHASONE DIPROPIONATE) 2 puffs first thing  in am and 2 puffs again in pm about 12 hours later  #1 x 11   Entered and Authorized by:   Nyoka Cowden MD   Signed by:   Nyoka Cowden MD on 02/08/2010   Method used:   [facility]ically [facility]        [facility] [id number]       [address]       [place], [place]  [address]       Ph: [phone] or [phone]       Fax: [phone]   RxID:   [id number]

## 2010-11-14 NOTE — Assessment & Plan Note (Signed)
Summary: Pulmonary/ ext f/u with hfa teaching 50% effecive   Copy to:  DR. Posey Rea Primary Provider/Referring Provider:  Tresa Garter MD  CC:  Followup.  Pt c/o increased SOB and dry cough x 2 days.  She states that she got out of breath yesterday while making the bed and when she carried groceries inside. She is currently taking prednisone 5 mg daily.Marland Kitchen  History of Present Illness: 40  yowf  who quit smoking in December 2007, with copd with minimum asthmatic component   baseline = grocery store ok, not mall or superstore not needing handicap parking.  Feb 15, 2009 ov c/o sob onset Feb 8th ? goiter > goiter surgery  12/29/08 no better then ended back in the hosp 4/19 - 02/11/2009    better for the last week prior to ov in terms of doe on symbicort and prednsione taper, and   dry raspy cough and hoarseness come and go without pattern. Symbicort 2 puffs first thing  in am and 2 puffs again in pm about 12 hours later chase the am dose with spiriva if short of cough or coughing ok to use xopenex or neb every 4 hours Taper off prednsione as previously instructed.  May 10, 2009- Still on prednisone 20mg  . she has a ceiling of 20mg  and floor of 5 mg.   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 Had been able to participate in rehab and maintain on prednisone 10 onehalf daily and rare need for rescue.  Since worse,  increased prednsione 10 2 daily x one week but no better and therefore increased to 4 daily 11/28 and now yellow nasal drainage. rec doxy and work on [employer].     July 26, 2011ov/ med review.  She remains on prednisone 5mg  once daily flares with taper lower.  July 07, 2010  Followup.  Pt c/o increased SOB and dry cough x 2 days.  She states that she got out of breath yesterday while making the bed and when she carried groceries inside. She is currently taking prednisone 5 mg daily. Has not started the  prednisone ceiling yet as per med calendar, still getting relief with saba.  Pt denies any significant sore throat, dysphagia, itching, sneezing,  nasal congestion or excess secretions,  fever, chills, sweats, unintended wt loss, pleuritic or exertional cp, hempoptysis,    orthopnea pnd or leg swelling   Current Medications (verified): 1)  Spiriva Handihaler 18 Mcg  Caps (Tiotropium Bromide Monohydrate) .... Inhale Contents of 1 Capsule Once A Day 2)  Symbicort 160-4.5 Mcg/act Aero (Budesonide-Formoterol Fumarate) .... 2 Puffs First Thing  in Am and 2 Puffs Again in Pm About 12 Hours Later 3)  Hydrochlorothiazide 50 Mg Tabs (Hydrochlorothiazide) .... One Daily 4)  Vitamin D3 1000 Unit  Tabs (Cholecalciferol) .Marland Kitchen.. 1 By Mouth Daily 5)  Calcium Carbonate-Vitamin D 600-400 Mg-Unit  Tabs (Calcium Carbonate-Vitamin D) .... Take 1 Tablet By Mouth Two Times A Day 6)  Azor 5-40 Mg  Tabs (Amlodipine-Olmesartan) .Marland Kitchen.. 1 By Mouth Daily 7)  Prednisone 10 Mg  Tabs (Prednisone) .... Take As Directed or See Below For Tapering Instructions 8)  Omeprazole 40 Mg Cpdr (Omeprazole) .... Take One 30-60 Min Before First and Last Meals of The Day 9)  Oxygen .... 1.5 Liters As Needed 10)  Pepcid Ac Maximum Strength 20 Mg Tabs (Famotidine) .... One At Bedtime 11)  Qvar  80 Mcg/act  Aers (Beclomethasone Dipropionate) .... 2 Puffs First Thing  in Am and 2 Puffs Again in Pm About 12 Hours Later 12)  Albuterol Sulfate (2.5 Mg/84ml) 0.083%  Nebu (Albuterol Sulfate) .... One Every 4-6 Hours If Needed For An Emergency 13)  Proair Hfa 108 [phone] Base) Mcg/act  Aers (Albuterol Sulfate) .... 2 Puffs Every 4-6 Hours As Needed 14)  Mucus Relief 400 Mg Tabs (Guaifenesin) .Marland Kitchen.. 1 Two Times A Day 15)  Zyrtec Allergy 10 Mg Caps (Cetirizine Hcl) .Marland Kitchen.. 1 At Bedtime As Needed  Allergies (verified): No Known Drug Allergies  Past History:  Past Medical History: COPD    - PFT's 12/07/08  FEV1 1.26(61%) ratio 42,  5% resp to B2    - PFT's 02/03/09   FEV1 .88  (38%) ratio 40     - PFT's 02/08/10 FEV1 1.30 (64%) ratio 45, 19% resp to B2 and DLC046% corrects to 63%    - HFA technique 75% Mar 04, 2009 > 50% p coaching July 07, 2010     - 02 dep at hs since April 2010    - Prednsone maint April 25, 2009     - Add on Qva 80 2 puffs first thing  in am and 2 puffs again in pm about 12 hours later  December 28, 2009  Hypertension Hives 07-2007 Allergic rhinitis    - Sinus ct 01/2009 wnl Goiter 2009 > resected 12/29/08 Health Maintenance......................................[facility] [place]     - Pneumovax September 15, 2009  Complex med regimen--Meds reviewed with pt education and computerized med calendar May 09, 2010  Vital Signs:  Patient profile:   65 year old female Weight:      154.50 pounds O2 Sat:      96 % on Room air Temp:     98.1 degrees F oral Pulse rate:   102 / minute BP sitting:   122 / 80  (left arm)  Vitals Entered By: Vernie Murders (July 07, 2010 9:17 AM)  O2 Flow:  Room air  Physical Exam  Additional Exam:   ambulatory wf nad mininmally cushingnoid  wt 144 April 25, 2009 >  162 December 28, 2009 > 159 February 08, 2010 >>156 May 09, 2010 > 154 July 07, 2010  HEENT:edentullous, nl turbinates, Post pharynx w/ scattered white patches.  Nl external ear canals without cough reflex NECK :  without JVD/Nodes/TM/ nl carotid upstrokes bilaterally LUNGS: barrel chest, marked increased t exp CV:  RRR  no s3 or murmur or increase in P2, no edema   ABD:  soft and nontender with nl excursion in the supine position. No bruits or organomegaly, bowel sounds nl MS:  warm without deformities, calf tenderness, cyanosis or clubbing         Impression & Recommendations:  Problem # 1:  COPD (ICD-496)    DDX of  difficult airways managment all start with A and  include Adherence, Ace Inhibitors, Acid Reflux, Active Sinus Disease, Alpha 1 Antitripsin deficiency, Anxiety masquerading as Airways dz,  ABPA,  allergy(esp in  young), Aspiration (esp in elderly), Adverse effects of DPI,  Active smokers, plus one B  = Beta blocker use..   Adherence seems better but still not understanding the action plans at the bottom of the calendar.  I emphasized to the patient that just like Dorothy in [employer], she is already wearing "ruby slippers" she can click anytime she wants:  the answer to all her recurrent symptoms  is  already  literally at her fingertips:   all she has to do is refer to the medicine calendar we provided her  and her problems  are each addressed in a user-friendly format, reading from left to right for each symptoms she's likely to develop between office visits.    .I spent extra time with the patient today explaining optimal mdi  technique.  This improved from  25-50% but no better, needs work  Orders: Est. Patient Level IV (16109)  Patient Instructions: 1)  Work on inhaler technique:  relax and blow all the way out then take a nice smooth deep breath back in, triggering the inhaler at same time you start breathing in hold a few seconds then rinse and gargle 2)  New floor for Prednisone is 10 mg one half daily 3)  See calendar for specific medication instructions and bring it back for each and every office visit for every healthcare provider you see.  Without it,  you may not receive the best quality medical care that we feel you deserve. (remember Dorothy and the [employer]) 4)  Return to office in 3 months, sooner if needed  Prescriptions: SYMBICORT 160-4.5 MCG/ACT AERO (BUDESONIDE-FORMOTEROL FUMARATE) 2 puffs first thing  in am and 2 puffs again in pm about 12 hours later  #1 x 11   Entered and Authorized by:   Nyoka Cowden MD   Signed by:   Nyoka Cowden MD on 07/07/2010   Method used:   [id number]       [address]       [place], [place]  [address]       Ph: [phone] or [phone]       Fax: [phone]   RxID:    [id number]

## 2010-11-14 NOTE — Assessment & Plan Note (Signed)
Summary: CPX/ NWS  #   Vital Signs:  Patient profile:   65 year old female Height:      62 inches Weight:      153 pounds BMI:     28.09 O2 Sat:      90 % on Room air Temp:     98.4 degrees F oral Pulse rate:   99 / minute Pulse rhythm:   regular Resp:     20 per minute BP sitting:   120 / 70  (left arm) Cuff size:   regular  Vitals Entered By: Lanier Prude, CMA(AAMA) (May 23, 2010 10:30 AM)  O2 Flow:  Room air  Primary Care Provider:  Tresa Garter MD   History of Present Illness: The patient presents for a preventive health examination  C/o wt gain on Prednisone - 20 lbs C/o SOB She is on 5 mg of Prednisone now  Current Medications (verified): 1)  Spiriva Handihaler 18 Mcg  Caps (Tiotropium Bromide Monohydrate) .... Inhale Contents of 1 Capsule Once A Day 2)  Symbicort 160-4.5 Mcg/act Aero (Budesonide-Formoterol Fumarate) .... 2 Puffs First Thing  in Am and 2 Puffs Again in Pm About 12 Hours Later 3)  Hydrochlorothiazide 50 Mg Tabs (Hydrochlorothiazide) .... One Daily 4)  Vitamin D3 1000 Unit  Tabs (Cholecalciferol) .Marland Kitchen.. 1 By Mouth Daily 5)  Calcium Carbonate-Vitamin D 600-400 Mg-Unit  Tabs (Calcium Carbonate-Vitamin D) .... Take 1 Tablet By Mouth Two Times A Day 6)  Azor 5-40 Mg  Tabs (Amlodipine-Olmesartan) .Marland Kitchen.. 1 By Mouth Daily 7)  Prednisone 10 Mg  Tabs (Prednisone) .... Take As Directed or See Below For Tapering Instructions 8)  Omeprazole 40 Mg Cpdr (Omeprazole) .... Take One 30-60 Min Before First and Last Meals of The Day 9)  Oxygen .... 1.5 Liters As Needed 10)  Pepcid Ac Maximum Strength 20 Mg Tabs (Famotidine) .... One At Bedtime 11)  Qvar 80 Mcg/act  Aers (Beclomethasone Dipropionate) .... 2 Puffs First Thing  in Am and 2 Puffs Again in Pm About 12 Hours Later 12)  Albuterol Sulfate (2.5 Mg/80ml) 0.083%  Nebu (Albuterol Sulfate) .... One Every 4-6 Hours If Needed For An Emergency 13)  Proair Hfa 108 [phone] Base) Mcg/act  Aers (Albuterol Sulfate) .... 2  Puffs Every 4-6 Hours As Needed 14)  Mucus Relief 400 Mg Tabs (Guaifenesin) .Marland Kitchen.. 1 Two Times A Day 15)  Zyrtec Allergy 10 Mg Caps (Cetirizine Hcl) .Marland Kitchen.. 1 At Bedtime As Needed  Allergies (verified): No Known Drug Allergies  Past History:  Past Medical History: Last updated: 05/09/2010 COPD    - PFT's 12/07/08  FEV1 1.26(61%) ratio 42,  5% resp to B2    - PFT's 02/03/09  FEV1 .88  (38%) ratio 40     - PFT's 02/08/10 FEV1 1.30 (64%) ratio 45, 19% resp to B2 and DLC046% corrects to 63%    - HFA technique 75% Mar 04, 2009 >90% April 25, 2009 > 90% December 28, 2009     - 02 dep at hs since April 2010    - Prednsone maint April 25, 2009     - Add on Qva 80 2 puffs first thing  in am and 2 puffs again in pm about 12 hours later  December 28, 2009  Hypertension Hives 07-2007 Allergic rhinitis    - Sinus ct 01/2009 wnl Goiter 2009 > resected 12/29/08 Health Maintenance......................................[facility] [place]     - Pneumovax September 15, 2009  Complex med regimen--Meds reviewed with  pt education and computerized med calendar May 09, 2010  Past Surgical History: Last updated: 02/15/2009 Tubal ligation Tonsillectomy wisdom teeth extracted Thyroidectomy  12/29/08  Family History: Last updated: 02/15/2009 Family History Hypertension neg resp dz  Social History: Married x 25 years. One child - son, 28. 2 grandchildren works at [employer] as a Conservation officer, nature 3 d/wk Former Smoker 2007, husband is smoking in the house  Review of Systems       The patient complains of weight gain, dyspnea on exertion, and prolonged cough.  The patient denies anorexia, fever, weight loss, vision loss, decreased hearing, hoarseness, chest pain, syncope, peripheral edema, headaches, hemoptysis, abdominal pain, melena, hematochezia, severe indigestion/heartburn, hematuria, incontinence, genital sores, muscle weakness, suspicious skin lesions, transient blindness, difficulty walking, depression, unusual weight  change, abnormal bleeding, enlarged lymph nodes, angioedema, and breast masses.    Physical Exam  General:  Well-developed,well-nourished,in no acute distress; alert,appropriate and cooperative throughout examination, mildly hoarse, pulse ov 98% WITH NO EXERTION.overweight-appearing.   Head:  Normocephalic and atraumatic without cushingoid face. No apparent alopecia or balding. Eyes:  No corneal or conjunctival inflammation noted. EOMI. Perrla. Funduscopic exam benign, without hemorrhages, exudates or papilledema. Vision grossly normal. Ears:  External ear exam shows no significant lesions or deformities.  Otoscopic examination reveals clear canals, tympanic membranes are intact bilaterally without bulging, retraction, inflammation or discharge. Hearing is grossly normal bilaterally. Nose:  External nasal examination shows no deformity or inflammation. Nasal mucosa are pink and moist without lesions or exudates. Mouth:  Oral mucosa and oropharynx without lesions or exudates.  Teeth in good repair. Neck:  No deformities, masses, or tenderness noted. Chest Wall:  No deformities, masses, or tenderness noted. Lungs:  Normal respiratory effort, chest expands symmetrically. Lungs are clear to auscultation, no crackles or wheezes. Heart:  Normal rate and regular rhythm. S1 and S2 normal without gallop, murmur, click, rub or other extra sounds. Abdomen:  Bowel sounds positive,abdomen soft and non-tender without masses, organomegaly or hernias noted. Msk:  No deformity or scoliosis noted of thoracic or lumbar spine.   Pulses:  R and L carotid,radial,femoral,dorsalis pedis and posterior tibial pulses are full and equal bilaterally Extremities:  No clubbing, cyanosis, edema, or deformity noted with normal full range of motion of all joints.   Neurologic:  No cranial nerve deficits noted. Station and gait are normal. Plantar reflexes are down-going bilaterally. DTRs are symmetrical throughout. Sensory, motor  and coordinative functions appear intact. Skin:  Intact without suspicious lesions or rashes Cervical Nodes:  No lymphadenopathy noted Inguinal Nodes:  No significant adenopathy Psych:  Cognition and judgment appear intact. Alert and cooperative with normal attention span and concentration. No apparent delusions, illusions, hallucinations   Impression & Recommendations:  Problem # 1:  Preventive Health Care (ICD-V70.0) Assessment New The labs were reviewed with the patient.  Health and age related issues were discussed. Available screening tests and vaccinations were discussed as well. Healthy life style including good diet and exercise was discussed.  GYN q 12 months  Mammo, colon, BDS discussed  Problem # 2:  COPD (ICD-496) Assessment: Improved  Her updated medication list for this problem includes:    Spiriva Handihaler 18 Mcg Caps (Tiotropium bromide monohydrate) ..... Inhale contents of 1 capsule once a day    Symbicort 160-4.5 Mcg/act Aero (Budesonide-formoterol fumarate) .Marland Kitchen... 2 puffs first thing  in am and 2 puffs again in pm about 12 hours later    Qvar 80 Mcg/act Aers (Beclomethasone dipropionate) .Marland Kitchen... 2 puffs first thing  in am and 2 puffs again in pm about 12 hours later    Albuterol Sulfate (2.5 Mg/80ml) 0.083% Nebu (Albuterol sulfate) ..... One every 4-6 hours if needed for an emergency    Proair Hfa 108 (90 Base) Mcg/act Aers (Albuterol sulfate) .Marland Kitchen... 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 Inhale contents of 1 capsule once a day 2)  Symbicort 160-4.5 Mcg/act Aero (Budesonide-formoterol fumarate) .... 2 puffs first thing  in am and 2 puffs again in pm about 12 hours later 3)  Hydrochlorothiazide 50 Mg Tabs (Hydrochlorothiazide) .... One daily 4)  Vitamin D3 1000 Unit Tabs (Cholecalciferol) .Marland Kitchen.. 1 by mouth  daily 5)  Calcium Carbonate-vitamin D 600-400 Mg-unit Tabs (Calcium carbonate-vitamin d) .... Take 1 tablet by mouth two times a day 6)  Azor 5-40 Mg Tabs (Amlodipine-olmesartan) .Marland Kitchen.. 1 by mouth daily 7)  Prednisone 10 Mg Tabs (Prednisone) .... Take as directed or see below for tapering instructions 8)  Omeprazole 40 Mg Cpdr (Omeprazole) .... Take one 30-60 min before first and last meals of the day 9)  Oxygen  .... 1.5 liters as needed 10)  Pepcid Ac Maximum Strength 20 Mg Tabs (Famotidine) .... One at bedtime 11)  Qvar 80 Mcg/act Aers (Beclomethasone dipropionate) .... 2 puffs first thing  in am and 2 puffs again in pm about 12 hours later 12)  Albuterol Sulfate (2.5 Mg/25ml) 0.083% Nebu (Albuterol sulfate) .... One every 4-6 hours if needed for an emergency 13)  Proair Hfa 108 (90 Base) Mcg/act Aers (Albuterol sulfate) .... 2 puffs every 4-6 hours as needed 14)  Mucus Relief 400 Mg Tabs (Guaifenesin) .Marland Kitchen.. 1 two times a day 15)  Zyrtec Allergy 10 Mg Caps (Cetirizine hcl) .Marland Kitchen.. 1 at bedtime as needed  Other Orders: EKG w/ Interpretation (93000)  Patient Instructions: 1)  Please schedule a follow-up appointment in 1 year well w/labs. Prescriptions: AZOR 5-40 MG  TABS (AMLODIPINE-OLMESARTAN) 1 by mouth daily  #90 x 3   Entered and Authorized by:   Tresa Garter MD   Signed by:   Tresa Garter MD on 05/23/2010   Method used:   Print then Give to Patient   RxID:   [id number] AZOR 5-40 MG  TABS (AMLODIPINE-OLMESARTAN) 1 by mouth daily  #30 Tablet x 12   Entered and Authorized by:   Tresa Garter MD   Signed by:   Tresa Garter MD on 05/23/2010   Method used:   [facility]        [id number]       [address]       [place], [place]  [address]       Ph: [phone] or [phone]       Fax: [phone]   RxID:   [id number]

## 2010-11-14 NOTE — Progress Notes (Signed)
Summary: Needs appt  ---- Converted from flag ---- ---- 04/20/2010 3:03 PM, Lanier Prude, CMA(AAMA) wrote:   ---- 04/20/2010 3:03 PM, Ivar Bury wrote: Per pt:  Do not desire appt at this time.  She sees Dr Sherene Sires and Rubye Oaks.--phone  ---- 04/20/2010 11:40 AM, Lanier Prude, CMA(AAMA) wrote: Please schedule pt for OV.   Thanks ------------------------------

## 2010-11-14 NOTE — Letter (Signed)
Summary: Prescriber Response Form/[facility]  Prescriber Response Form/[facility]   Imported By: Sherian Rein 03/06/2010 14:14:50  _____________________________________________________________________  External Attachment:    Type:   Image     Comment:   External Document

## 2010-11-14 NOTE — Assessment & Plan Note (Signed)
Summary: NP follow up - med calendar   Copy to:  DR. Posey Rea Primary Provider/Referring Provider:  Tresa Garter MD  CC:  est med calendar - pt brought all meds with her today.  states having increased SOB w/ activity d/t the heat and humidity..  History of Present Illness: 65  yowf  who quit smoking in December 2007, with copd with minimum asthmatic component   baseline = grocery store ok, not mall or superstore not needing handicap parking.  Feb 15, 2009 ov c/o sob onset Feb 8th ? goiter > goiter surgery  12/29/08 no better then ended back in the hosp 4/19 - 02/11/2009    better for the last week prior to ov in terms of doe on symbicort and prednsione taper, and   dry raspy cough and hoarseness come and go without pattern. Symbicort 2 puffs first thing  in am and 2 puffs again in pm about 12 hours later chase the am dose with spiriva if short of cough or coughing ok to use xopenex or neb every 4 hours Taper off prednsione as previously instructed.  May 10, 2009- Still on prednisone 20mg  . she has a ceiling of 20mg  and floor of 5 mg.   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 Had been able to participate in rehab and maintain on prednisone 10 onehalf daily and rare need for rescue.  Since worse,  increased prednsione 10 2 daily x one week but no better and therefore increased to 4 daily 11/28 and now yellow nasal drainage. rec doxy and work on [employer].  December 28, 2009 Acute visit.  Pt c/o increased SOB x 2 days.  She states that she gets out of breath walking from room to room at home and just making her bed.  She also c/o dry cough getting worse this past wk. Has found that pred taper to 10 mg per day tend to flare and now back up to 20 mg x 2 days. no purulent sputum.  pril 27, 2011 4 wk followup with PFT's.  Pt states breathing is about the same- maybe some better with qvar.  She states that she is  "getting over cold"- has prod cough with clear mucus.  She states that she needs to use her proair at least bid but that's better than before.  She is currently taking 5 mg prednisone daily.   May 09, 2010--Presents for follow up and med review.  Can not tolerate the high heat and humidity. Has to stay in the air conditioning. No flare of symptoms unless she gets overheated. We reviewed her meds and updated her med calendar. She remains on prednisone 5mg  once daily. Denies chest pain,  orthopnea, hemoptysis, fever, n/v/d, edema, headache.   Medications Prior to Update: 1)  Spiriva Handihaler 18 Mcg  Caps (Tiotropium Bromide Monohydrate) .... Inhale Contents of 1 Capsule Once A Day 2)  Symbicort 160-4.5 Mcg/act Aero (Budesonide-Formoterol Fumarate) .... 2 Puffs First Thing  in Am and 2 Puffs Again in Pm About 12 Hours Later 3)  Vitamin D3 1000 Unit  Tabs (Cholecalciferol) .Marland Kitchen.. 1 By Mouth Daily 4)  Calcium Carbonate-Vitamin D 600-400 Mg-Unit  Tabs (Calcium Carbonate-Vitamin D) .... Take 1 Tablet By Mouth Two Times A Day 5)  Azor 5-40 Mg  Tabs (Amlodipine-Olmesartan) .Marland Kitchen.. 1 By Mouth Daily 6)  Prednisone 10 Mg  Tabs (Prednisone) .... Take As Directed  or See Below For Tapering Instructions 7)  Omeprazole 40 Mg Cpdr (Omeprazole) .... Take One 30-60 Min Before First and Last Meals of The Day 8)  Oxygen .... 1.5 Liters As Needed 9)  Hydrochlorothiazide 50 Mg Tabs (Hydrochlorothiazide) .... One Daily 10)  Pepcid Ac Maximum Strength 20 Mg Tabs (Famotidine) .... One At Bedtime 11)  Albuterol Sulfate (2.5 Mg/46ml) 0.083%  Nebu (Albuterol Sulfate) .... One Every 4-6 Hours If Needed For An Emergency 12)  Qvar 80 Mcg/act  Aers (Beclomethasone Dipropionate) .... 2 Puffs First Thing  in Am and 2 Puffs Again in Pm About 12 Hours Later 13)  Proair Hfa 108 (90 Base) Mcg/act  Aers (Albuterol Sulfate) .... 2 Puffs Every 4-6 Hours As Needed 14)  Mucus Relief 400 Mg Tabs (Guaifenesin) .Marland Kitchen.. 1 Two Times A Day  Allergies  (verified): No Known Drug Allergies  Past History:  Past Surgical History: Last updated: 02/15/2009 Tubal ligation Tonsillectomy wisdom teeth extracted Thyroidectomy  12/29/08  Family History: Last updated: 02/15/2009 Family History Hypertension neg resp dz  Social History: Last updated: 05/12/2008 Married x 25 years. One child - son, 49. 2 grandchildren works at [employer] as a Conservation officer, nature Former Smoker 2007, husband is smoking in the house  Risk Factors: Alcohol Use: <1 (07/07/2007) Exercise: no (10/04/2008)  Risk Factors: Smoking Status: quit (05/12/2008) Passive Smoke Exposure: no (10/04/2008)  Past Medical History: COPD    - PFT's 12/07/08  FEV1 1.26(61%) ratio 42,  5% resp to B2    - PFT's 02/03/09  FEV1 .88  (38%) ratio 40     - PFT's 02/08/10 FEV1 1.30 (64%) ratio 45, 19% resp to B2 and DLC046% corrects to 63%    - HFA technique 75% Mar 04, 2009 >90% April 25, 2009 > 90% December 28, 2009     - 02 dep at hs since April 2010    - Prednsone maint April 25, 2009     - Add on Qva 80 2 puffs first thing  in am and 2 puffs again in pm about 12 hours later  December 28, 2009  Hypertension Hives 07-2007 Allergic rhinitis    - Sinus ct 01/2009 wnl Goiter 2009 > resected 12/29/08 Health Maintenance......................................[facility] [place]     - Pneumovax September 15, 2009  Complex med regimen--Meds reviewed with pt education and computerized med calendar May 09, 2010  Vital Signs:  Patient profile:   65 year old female Height:      62 inches Weight:      156 pounds BMI:     28.64 O2 Sat:      96 % on Room air Temp:     99.7 degrees F oral Pulse rate:   90 / minute BP sitting:   122 / 70  (right arm) Cuff size:   regular  Vitals Entered By: Boone Master CNA/MA (May 09, 2010 9:58 AM)  O2 Flow:  Room air CC: est med calendar - pt brought all meds with her today.  states having increased SOB w/ activity d/t the heat and humidity. Is Patient Diabetic?  No Comments Medications reviewed with patient Daytime contact number verified with patient. Boone Master CNA/MA  May 09, 2010 9:58 AM    Physical Exam  Additional Exam:   ambulatory wf nad mininmally cushingnoid  wt 144 April 25, 2009 > 149 June 27, 2009  149 September 15, 2009 > 162 December 28, 2009 > 159 February 08, 2010 >>156 May 09, 2010  HEENT:edentullous, nl turbinates, Post  pharynx w/ scattered white patches.  Nl external ear canals without cough reflex NECK :  without JVD/Nodes/TM/ nl carotid upstrokes bilaterally LUNGS: barrel chest, marked increased t exp CV:  RRR  no s3 or murmur or increase in P2, no edema   ABD:  soft and nontender with nl excursion in the supine position. No bruits or organomegaly, bowel sounds nl MS:  warm without deformities, calf tenderness, cyanosis or clubbing         Impression & Recommendations:  Problem # 1:  COPD (ICD-496)  compensated on present regimen.  she remains stable w/ additon of QVAR, have been able to decrease steroids to 5mg  once daily  will aim to further titrate down w/ hopes to taper off at some point if stable on return will evaluate.  REC:  Follow med calendar closely and birng to each visit.  Taper prednisone as recommended on calendar.  follow up with Dr. Valerie Salts in few weeks.  follow up Dr. Sherene Sires in 2-3 months   Orders: Est. Patient Level IV (54270)  Problem # 2:  CANDIDIASIS, ORAL (ICD-112.0)  Mycelex troches 5 x day for 7 days Brush/rinse and gargle after inhaler use.   Orders: Est. Patient Level IV (62376)  Medications Added to Medication List This Visit: 1)  Mycelex 10 Mg Troc (Clotrimazole) .Marland Kitchen.. 1 fives times a day for 7 days for thrush  Complete Medication List: 1)  Spiriva Handihaler 18 Mcg Caps (Tiotropium bromide monohydrate) .... Inhale contents of 1 capsule once a day 2)  Symbicort 160-4.5 Mcg/act Aero (Budesonide-formoterol fumarate) .... 2 puffs first thing  in am and 2 puffs again in pm about  12 hours later 3)  Vitamin D3 1000 Unit Tabs (Cholecalciferol) .Marland Kitchen.. 1 by mouth daily 4)  Calcium Carbonate-vitamin D 600-400 Mg-unit Tabs (Calcium carbonate-vitamin d) .... Take 1 tablet by mouth two times a day 5)  Azor 5-40 Mg Tabs (Amlodipine-olmesartan) .Marland Kitchen.. 1 by mouth daily 6)  Prednisone 10 Mg Tabs (Prednisone) .... Take as directed or see below for tapering instructions 7)  Omeprazole 40 Mg Cpdr (Omeprazole) .... Take one 30-60 min before first and last meals of the day 8)  Oxygen  .... 1.5 liters as needed 9)  Hydrochlorothiazide 50 Mg Tabs (Hydrochlorothiazide) .... One daily 10)  Pepcid Ac Maximum Strength 20 Mg Tabs (Famotidine) .... One at bedtime 11)  Albuterol Sulfate (2.5 Mg/53ml) 0.083% Nebu (Albuterol sulfate) .... One every 4-6 hours if needed for an emergency 12)  Qvar 80 Mcg/act Aers (Beclomethasone dipropionate) .... 2 puffs first thing  in am and 2 puffs again in pm about 12 hours later 13)  Proair Hfa 108 (90 Base) Mcg/act Aers (Albuterol sulfate) .... 2 puffs every 4-6 hours as needed 14)  Mucus Relief 400 Mg Tabs (Guaifenesin) .Marland Kitchen.. 1 two times a day 15)  Mycelex 10 Mg Troc (Clotrimazole) .Marland Kitchen.. 1 fives times a day for 7 days for thrush  Patient Instructions: 1)  Follow med calendar closely and birng to each visit.  2)  Taper prednisone as recommended on calendar.  3)  follow up with Dr. Valerie Salts in few weeks.  4)  follow up Dr. Sherene Sires in 2-3 months  5)  Mycelex troches 5 x day for 7 days 6)  Brush/rinse and gargle after inhaler use.  Prescriptions: MYCELEX 10 MG TROC (CLOTRIMAZOLE) 1 fives times a day for 7 days for thrush  #35 x 0   Entered and Authorized by:   Rubye Oaks NP   Signed by:  Nishtha Raider NP on 05/09/2010   Method used:   Electronically to        [facility]  [address] [id number] (ret[facility])       [address]       [place], [place]  [address]       Ph: [phone] or [phone]       Fax: [phone]   RxID:   [id number] AZOR  5-40 MG  TABS (AMLODIPINE-OLMESARTAN) 1 by mouth daily  #30 Tablet x 1   Entered and Authorized by:   Rubye Oaks NP   Signed by:   Cariann Kinnamon NP on 05/09/2010   Method used:   [facility]ronically to        [facility]  [id number] ([facility]       [address]       [place], [place]  [address]       Ph: [phone] or [phone]       Fax: [phone]   RxID:   [id number]    Immunization History:  Influenza Immunization History:    Influenza:  historical (07/15/2009)   Appended Document: med calendar updated Medications Added ZYRTEC ALLERGY 10 MG CAPS (CETIRIZINE HCL) 1 at bedtime as needed          Clinical Lists Changes  Medications: Removed medication of MYCELEX 10 MG TROC (CLOTRIMAZOLE) 1 fives times a day for 7 days for thrush Added new medication of ZYRTEC ALLERGY 10 MG CAPS (CETIRIZINE HCL) 1 at bedtime as needed

## 2010-11-16 NOTE — Miscellaneous (Signed)
Summary: BONE DENSITY  Clinical Lists Changes  Orders: Added new Test order of T-Bone Densitometry (77080) - Signed Added new Test order of T-Lumbar Vertebral Assessment (77082) - Signed 

## 2010-11-16 NOTE — Assessment & Plan Note (Signed)
Summary: Pulmonary/ ext ov with walking sats ok x 2 laps, try valium   Copy to:  DR. Posey Rea Primary Provider/Referring Provider:  Tresa Garter MD  CC:  Dyspnea- worse.  History of Present Illness: 65  yowf  who quit smoking in December 2007, with copd with minimum asthmatic component   baseline = grocery store ok, not mall or superstore not needing handicap parking.  Feb 15, 2009 ov c/o sob onset Feb 8th ? goiter > goiter surgery  12/29/08 no better then ended back in the hosp 4/19 - 02/11/2009    better for the last week prior to ov in terms of doe on symbicort and prednsione taper, and   dry raspy cough and hoarseness come and go without pattern. Symbicort 2 puffs first thing  in am and 2 puffs again in pm about 12 hours later chase the am dose with spiriva if short of cough or coughing ok to use xopenex or neb every 4 hours Taper off prednsione as previously instructed.  May 10, 2009- Still on prednisone 20mg  . she has a ceiling of 20mg  and floor of 5 mg.   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 Had been able to participate in rehab and maintain on prednisone 10 onehalf daily and rare need for rescue.  Since worse,  increased prednsione 10 2 daily x one week but no better and therefore increased to 4 daily 11/28 and now yellow nasal drainage. rec doxy and work on [employer].     July 26, 2011ov/ med review.  She remains on prednisone 5mg  once daily flares with taper lower.  July 07, 2010  ov cc  SOB and dry cough x 2 days.  She states that she got out of breath yesterday while making the bed and when she carried groceries inside on prednisone 5 mg daily rec ceiling of 20 mg per day and work on technique with hfa  October 05, 2010 ov co worse doe despite pred 20 and no longer benefit from saba, no assoc cough or noct/early am symptoms. Pt denies any significant sore throat, dysphagia, itching,  sneezing,  nasal congestion or excess secretions,  fever, chills, sweats, unintended wt loss, pleuritic or exertional cp, hempoptysis, change in activity tolerance  orthopnea pnd or leg swelling,  obvious fluctuation in symptoms with weather or environment/activity     Current Medications (verified): 1)  Spiriva Handihaler 18 Mcg  Caps (Tiotropium Bromide Monohydrate) .... Inhale Contents of 1 Capsule Once A Day 2)  Symbicort 160-4.5 Mcg/act Aero (Budesonide-Formoterol Fumarate) .... 2 Puffs First Thing  in Am and 2 Puffs Again in Pm About 12 Hours Later 3)  Hydrochlorothiazide 50 Mg Tabs (Hydrochlorothiazide) .... One Daily 4)  Vitamin D3 1000 Unit  Tabs (Cholecalciferol) .Marland Kitchen.. 1 By Mouth Daily 5)  Calcium Carbonate-Vitamin D 600-400 Mg-Unit  Tabs (Calcium Carbonate-Vitamin D) .... Take 1 Tablet By Mouth Two Times A Day 6)  Azor 5-40 Mg  Tabs (Amlodipine-Olmesartan) .Marland Kitchen.. 1 By Mouth Daily 7)  Prednisone 10 Mg  Tabs (Prednisone) .... Take As Directed or See Below For Tapering Instructions 8)  Omeprazole 40 Mg Cpdr (Omeprazole) .... Take One 30-60 Min Before First and Last Meals of The Day 9)  Oxygen .... 1.5 Liters At Bedtime 10)  Pepcid Ac Maximum Strength 20 Mg Tabs (Famotidine) .... One At Bedtime 11)  Qvar 80 Mcg/act  Aers (Beclomethasone Dipropionate) .Marland KitchenMarland KitchenMarland Kitchen  2 Puffs First Thing  in Am and 2 Puffs Again in Pm About 12 Hours Later 12)  Albuterol Sulfate (2.5 Mg/19ml) 0.083%  Nebu (Albuterol Sulfate) .... One Every 4-6 Hours If Needed For An Emergency 13)  Proair Hfa 108 [phone] Base) Mcg/act  Aers (Albuterol Sulfate) .... 2 Puffs Every 4-6 Hours As Needed 14)  Mucus Relief 400 Mg Tabs (Guaifenesin) .Marland Kitchen.. 1 Two Times A Day 15)  Zyrtec Allergy 10 Mg Caps (Cetirizine Hcl) .Marland Kitchen.. 1 At Bedtime As Needed  Allergies (verified): No Known Drug Allergies  Past History:  Past Medical History: COPD    - PFT's 12/07/08  FEV1 1.26(61%) ratio 42,  5% resp to B2    - PFT's 02/03/09  FEV1 .88  (38%) ratio 40     -  PFT's 02/08/10 FEV1 1.30 (64%) ratio 45, 19% resp to B2 and DLC046% corrects to 63%    - HFA technique 75% Mar 04, 2009 > 50% p coaching July 07, 2010 >  75% October 05, 2010     - 02 dep at hs since April 2010, no desat walking October 05, 2010     - Prednsone maint April 25, 2009     - Add on Qva 80 2 puffs first thing  in am and 2 puffs again in pm about 12 hours later  December 28, 2009  Hypertension Hives 07-2007 Allergic rhinitis    - Sinus ct 01/2009 wnl Goiter 2009 > resected 12/29/08 Health Maintenance......................................[facility] [place]     - Pneumovax September 15, 2009      - DEXA ordered October 05, 2010  Complex med regimen--Meds reviewed with pt education and computerized med calendar May 09, 2010  Vital Signs:  Patient profile:   65 year old female Weight:      153 pounds O2 Sat:      94 % on Room air Temp:     98.6 degrees F oral Pulse rate:   103 / minute BP sitting:   150 / 86  (left arm)  Vitals Entered By: Vernie Murders (October 05, 2010 9:11 AM)  O2 Flow:  Room air  Serial Vital Signs/Assessments:  Comments: Ambulatory Pulse Oximetry  Resting; HR__83___    02 Sat__97%RA___  Lap1 (185 feet)   HR__110___   02 Sat__93%RA___ Lap2 (185 feet)   HR__114___   02 Sat__90%RA___    Lap3 (185 feet)   HR_____   02 Sat_____  ___Test Completed without Difficulty _x__Test Stopped due to: pt c/o SOB. Zackery Barefoot CMA  October 05, 2010 9:55 AM    By: Zackery Barefoot CMA    Physical Exam  Additional Exam:   ambulatory wf nad moderatly cushingnoid  wt 144 April 25, 2009 >  162 December 28, 2009 > 159 February 08, 2010 >>156 May 09, 2010 > 154 July 07, 2010 > 153 October 05, 2010  HEENT:edentullous, nl turbinates, Post pharynx w/ scattered white patches.  Nl external ear canals without cough reflex NECK :  without JVD/Nodes/TM/ nl carotid upstrokes bilaterally LUNGS: barrel chest, marked increased t exp CV:  RRR  no s3 or murmur or  increase in P2, no edema   ABD:  soft and nontender with nl excursion in the supine position. No bruits or organomegaly, bowel sounds nl MS:  warm without deformities, calf tenderness, cyanosis or clubbing         Impression & Recommendations:  Problem # 1:  COPD (ICD-496) DDX of  difficult airways managment all start with A  and  include Adherence, Ace Inhibitors, Acid Reflux, Active Sinus Disease, Alpha 1 Antitripsin deficiency, Anxiety masquerading as Airways dz,  ABPA,  allergy(esp in young), Aspiration (esp in elderly), Adverse effects of DPI,  Active smokers, plus one B  = Beta blocker use..   Adherence seems better  and now l  understanding the action plans at the bottom of the calendar.  I  .I spent extra time with the patient today explaining optimal mdi  technique.  This improved from  50-75% p coaching  ? Anxiety:  suggested by no better on prednisone, no better on SABA > try valium    Problem # 2:  RESPIRATORY FAILURE, CHRONIC (ICD-518.83)  ok sats walking RA ok so this is not the main problem  Orders: Est. Patient Level IV (16109)  Problem # 3:  THRUSH (ICD-112.0) Clotrimazole troch as needed  Problem # 4:  VITAMIN D DEFICIENCY (ICD-268.9) She is at high risk osteo > needs bone scan and biphosphonates ? reclast since gerd such a major problem in this type of pt  Medications Added to Medication List This Visit: 1)  Oxygen  .... 1.5 liters at bedtime 2)  Valium 5 Mg Tabs (Diazepam) .... One every 6 hours if needed 3)  Clotrimazole 10 Mg Lozg (Clotrimazole)  Other Orders: Misc. Referral (Misc. Ref)  Patient Instructions: 1)  For stress try Valium 5 mg every 6 hours if needed 2)  See calendar for specific medication instructions and bring it back for each and every office visit for every healthcare provider you see.  Without it,  you may not receive the best quality medical care that we feel you deserve.  3)  See Patient Care Coordinator before leaving for bone  density 4)  Please schedule a follow-up appointment in 6 weeks, sooner if needed  Prescriptions: CLOTRIMAZOLE 10 MG LOZG (CLOTRIMAZOLE)   #16 x 11   Entered and Authorized by:   Nyoka Cowden MD   Signed by:   Nyoka Cowden MD on 10/05/2010   Method used:   Print then Give to Patient   RxID:   [id number] VALIUM 5 MG TABS (DIAZEPAM) one every 6 hours if needed  #90 x 0   Entered and Authorized by:   Nyoka Cowden MD   Signed by:   Nyoka Cowden MD on 10/05/2010   Method used:   Print then Give to Patient   RxID:   [id number]

## 2010-11-16 NOTE — Assessment & Plan Note (Signed)
Summary: Pulmonary/ ext ov with hfa 90% try change to dulera   Copy to:  DR. Posey Rea Primary Provider/Referring Provider:  Tresa Garter MD  CC:  Dyspnea- the same.  History of Present Illness: 65  yowf  who quit smoking in December 2007, with copd with minimum asthmatic component   baseline = grocery store ok, not mall or superstore not needing handicap parking.  Feb 15, 2009 ov c/o sob onset Feb 8th ? goiter > goiter surgery  12/29/08 no better then ended back in the hosp 4/19 - 02/11/2009    better for the last week prior to ov in terms of doe on symbicort and prednsione taper, and   dry raspy cough and hoarseness come and go without pattern. Symbicort 2 puffs first thing  in am and 2 puffs again in pm about 12 hours later chase the am dose with spiriva if short of cough or coughing ok to use xopenex or neb every 4 hours Taper off prednsione as previously instructed.  May 10, 2009- Still on prednisone 20mg  . she has a ceiling of 20mg  and floor of 5 mg.   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 Had been able to participate in rehab and maintain on prednisone 10 onehalf daily and rare need for rescue.  Since worse,  increased prednsione 10 2 daily x one week but no better and therefore increased to 4 daily 11/28 and now yellow nasal drainage. rec doxy and work on [employer].   July 26, 2011ov/ med review.  She remains on prednisone 5mg  once daily flares with taper lower.  July 07, 2010  ov cc  SOB and dry cough x 2 days.  She states that she got out of breath yesterday while making the bed and when she carried groceries inside on prednisone 5 mg daily rec ceiling of 20 mg per day and work on technique with hfa  October 05, 2010 ov co worse doe despite pred 20 and no longer benefit from saba, no assoc cough or noct/early am symptoms.  .rec For stress try Valium 5 mg every 6 hours if needed See  calendar/ get bone density since can't get off prednsione  see page 2 November 09, 2010 ov sob no better, no cough.  Pt denies any significant sore throat, dysphagia, itching, sneezing,  nasal congestion or excess secretions,  fever, chills, sweats, unintended wt loss, pleuritic or exertional cp, hempoptysis, change in activity tolerance  orthopnea pnd or leg swelling Pt also denies any obvious fluctuation in symptoms with weather or environmental change or other alleviating or aggravating factors.       Current Medications (verified): 1)  Spiriva Handihaler 18 Mcg  Caps (Tiotropium Bromide Monohydrate) .... Inhale Contents of 1 Capsule Once A Day 2)  Symbicort 160-4.5 Mcg/act Aero (Budesonide-Formoterol Fumarate) .... 2 Puffs First Thing  in Am and 2 Puffs Again in Pm About 12 Hours Later 3)  Hydrochlorothiazide 50 Mg Tabs (Hydrochlorothiazide) .... One Daily 4)  Vitamin D3 1000 Unit  Tabs (Cholecalciferol) .Marland Kitchen.. 1 By Mouth Daily 5)  Calcium Carbonate-Vitamin D 600-400 Mg-Unit  Tabs (Calcium Carbonate-Vitamin D) .... Take 1 Tablet By Mouth Two Times A Day 6)  Azor 5-40 Mg  Tabs (Amlodipine-Olmesartan) .Marland Kitchen.. 1 By Mouth Daily 7)  Prednisone 10 Mg  Tabs (Prednisone) .... Take As Directed or See Below For Tapering Instructions 8)  Omeprazole 40 Mg  Cpdr (Omeprazole) .... Take One 30-60 Min Before First and Last Meals of The Day 9)  Oxygen .... 1.5 Liters At Bedtime 10)  Pepcid Ac Maximum Strength 20 Mg Tabs (Famotidine) .... One At Bedtime 11)  Qvar 80 Mcg/act  Aers (Beclomethasone Dipropionate) .... 2 Puffs First Thing  in Am and 2 Puffs Again in Pm About 12 Hours Later 12)  Albuterol Sulfate (2.5 Mg/87ml) 0.083%  Nebu (Albuterol Sulfate) .... One Every 4-6 Hours If Needed For An Emergency 13)  Proair Hfa 108 [phone] Base) Mcg/act  Aers (Albuterol Sulfate) .... 2 Puffs Every 4-6 Hours As Needed 14)  Mucus Relief 400 Mg Tabs (Guaifenesin) .Marland Kitchen.. 1 Two Times A Day 15)  Zyrtec Allergy 10 Mg Caps (Cetirizine  Hcl) .Marland Kitchen.. 1 At Bedtime As Needed 16)  Valium 5 Mg Tabs (Diazepam) .... One Every 6 Hours If Needed  Allergies (verified): No Known Drug Allergies  Past History:  Past Medical History: COPD    - PFT's 12/07/08  FEV1 1.26(61%) ratio 42,  5% resp to B2    - PFT's 02/03/09  FEV1 .88  (38%) ratio 40     - PFT's 02/08/10 FEV1 1.30 (64%) ratio 45, 19% resp to B2 and DLC046% corrects to 63%    - HFA technique 75% Mar 04, 2009 >  75% October 05, 2010> 90% November 11, 2010      - 02 dep at hs since April 2010, no desat walking October 05, 2010     - Prednsone maint April 25, 2009     - Add on Qva 80   December 28, 2009 > d/c November 09, 2010 as not effective Hypertension Hives 07-2007 Allergic rhinitis    - Sinus ct 01/2009 wnl Goiter 2009 > resected 12/29/08 Health Maintenance......................................[facility] [place]     - Pneumovax September 15, 2009      - DEXA ordered October 05, 2010  Complex med regimen--Meds reviewed with pt education and computerized med calendar May 09, 2010  Vital Signs:  Patient profile:   65 year old female Weight:      154.25 pounds O2 Sat:      96 % on Room air Temp:     97.8 degrees F oral Pulse rate:   108 / minute BP sitting:   104 / 60  (left arm)  Vitals Entered By: Vernie Murders (November 09, 2010 10:25 AM)  O2 Flow:  Room air  Physical Exam  Additional Exam:   ambulatory wf nad moderatly cushingnoid  wt 144 April 25, 2009 >  162 December 28, 2009 > 159 February 08, 2010 >>156 May 09, 2010 > 154 November 09, 2010   HEENT:edentullous, nl turbinates, Post pharynx w/ minimal white patches.  Nl external ear canals without cough reflex NECK :  without JVD/Nodes/TM/ nl carotid upstrokes bilaterally LUNGS: barrel chest, marked increased t exp CV:  RRR  no s3 or murmur or increase in P2, no edema   ABD:  soft and nontender with nl excursion in the supine position. No bruits or organomegaly, bowel sounds nl MS:  warm without deformities, calf  tenderness, cyanosis or clubbing         Impression & Recommendations:  Problem # 1:  COPD (ICD-496)   DDX of  difficult airways managment all start with A and  include Adherence, Ace Inhibitors, Acid Reflux, Active Sinus Disease, Alpha 1 Antitripsin deficiency, Anxiety masquerading as Airways dz,  ABPA,  allergy(esp in young), Aspiration (esp in elderly), Adverse effects of  DPI,  Active smokers, plus two Bs  = Bronchiectasis and Beta blocker use..and one C= CHF   Adherence better I spent extra time with the patient today explaining optimal mdi  technique.  This improved from  75-90%   ? adverse effect of inhalers > try off symbicort and qvar and replace with trial of dulera then return for pft's    Medications Added to Medication List This Visit: 1)  Dulera 200-5 Mcg/act Aero (Mometasone furo-formoterol fum) .... 2 puffs first thing  in am and 2 puffs again in pm about 12 hours later  Other Orders: Est. Patient Level III (45409)  Patient Instructions: 1)  Please schedule a follow-up appointment in 6 weeks, sooner if needed with PFT's

## 2010-12-21 ENCOUNTER — Ambulatory Visit (INDEPENDENT_AMBULATORY_CARE_PROVIDER_SITE_OTHER): Payer: BC Managed Care – PPO | Admitting: Internal Medicine

## 2010-12-21 ENCOUNTER — Encounter: Payer: Self-pay | Admitting: Internal Medicine

## 2010-12-21 DIAGNOSIS — J449 Chronic obstructive pulmonary disease, unspecified: Secondary | ICD-10-CM

## 2010-12-26 NOTE — Assessment & Plan Note (Signed)
Summary: Pulmonary/ f/u copd better on dulera 200, try pred down to 5mg    Copy to:  DR. Posey Rea Primary Provider/Referring Provider:  Tresa Garter MD  CC:  Dyspnea- much improved.  History of Present Illness: 65  yowf  who quit smoking in December 2007, with copd with minimum asthmatic component   baseline = grocery store ok,  tends to avoid  mall or superstores and does use [facility] parking  Feb 15, 2009 ov c/o sob onset Feb 8th ? goiter > goiter surgery  12/29/08 no better then ended back in the hosp 4/19 - 02/11/2009    better for the last week prior to ov in terms of doe on symbicort and prednsione taper, and   dry raspy cough and hoarseness come and go without pattern. Symbicort 2 puffs first thing  in am and 2 puffs again in pm about 12 hours later chase the am dose with spiriva if short of cough or coughing ok to use xopenex or neb every 4 hours Taper off prednsione as previously instructed.  May 10, 2009- Still on prednisone 20mg  . she has a ceiling of 20mg  and floor of 5 mg.   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 Had been able to participate in rehab and maintain on prednisone 10 onehalf daily and rare need for rescue.  Since worse,  increased prednsione 10 2 daily x one week but no better and therefore increased to 4 daily 11/28 and now yellow nasal drainage. rec doxy and work on [employer].   July 26, 2011ov/ med review.  She remains on prednisone 5mg  once daily flares with taper lower.  July 07, 2010  ov cc  SOB and dry cough x 2 days.  She states that she got out of breath yesterday while making the bed and when she carried groceries inside on prednisone 5 mg daily rec ceiling of 20 mg per day and work on technique with hfa  October 05, 2010 ov co worse doe despite pred 20 and no longer benefit from saba, no assoc cough or noct/early am symptoms.  .rec For stress try Valium 5 mg every 6 hours  if needed See calendar/ get bone density since can't get off prednsione  see page 2 November 09, 2010 ov sob no better, no cough.  rec start dulera and taper prednisone to a floor of 5 mg and as needed valium  December 21, 2010 ov better , tapered to 10 mg per day. no cough, breathing better with less mouth irritation on dulera. using valium no more than one daily.    Pt denies any significant sore throat, dysphagia, itching, sneezing,  nasal congestion or excess secretions,  fever, chills, sweats, unintended wt loss, pleuritic or exertional cp, hempoptysis, change in activity tolerance  orthopnea pnd or leg swelling Pt also denies any obvious fluctuation in symptoms with weather or environmental change or other alleviating or aggravating factors.       Current Medications (verified): 1)  Spiriva Handihaler 18 Mcg  Caps (Tiotropium Bromide Monohydrate) .... Inhale Contents of 1 Capsule Once A Day 2)  Hydrochlorothiazide 50 Mg Tabs (Hydrochlorothiazide) .... One Daily 3)  Vitamin D3 1000 Unit  Tabs (Cholecalciferol) .Marland Kitchen.. 1 By Mouth Daily 4)  Calcium Carbonate-Vitamin D 600-400 Mg-Unit  Tabs (Calcium Carbonate-Vitamin D) .... Take 1 Tablet By Mouth Two Times A Day 5)  Azor 5-40 Mg  Tabs (Amlodipine-Olmesartan) .Marland Kitchen.. 1 By Mouth Daily 6)  Prednisone 10 Mg  Tabs (Prednisone) .... Take As Directed or See Below For Tapering Instructions 7)  Omeprazole 40 Mg Cpdr (Omeprazole) .... Take One 30-60 Min Before First and Last Meals of The Day 8)  Oxygen .... 1.5 Liters At Bedtime 9)  Pepcid Ac Maximum Strength 20 Mg Tabs (Famotidine) .... One At Bedtime 10)  Dulera 200-5 Mcg/act Aero (Mometasone Furo-Formoterol Fum) .... 2 Puffs First Thing  in Am and 2 Puffs Again in Pm About 12 Hours Later 11)  Albuterol Sulfate (2.5 Mg/93ml) 0.083%  Nebu (Albuterol Sulfate) .... One Every 4-6 Hours If Needed For An Emergency 12)  Proair Hfa 108 [phone] Base) Mcg/act  Aers (Albuterol Sulfate) .... 2 Puffs Every 4-6 Hours As  Needed 13)  Mucus Relief 400 Mg Tabs (Guaifenesin) .Marland Kitchen.. 1 Two Times A Day 14)  Zyrtec Allergy 10 Mg Caps (Cetirizine Hcl) .Marland Kitchen.. 1 At Bedtime As Needed 15)  Valium 5 Mg Tabs (Diazepam) .... One Every 6 Hours If Needed  Allergies (verified): No Known Drug Allergies  Past History:  Past Medical History: COPD    - PFT's 12/07/08  FEV1 1.26(61%) ratio 42,  5% resp to B2    - PFT's 02/03/09  FEV1 .88  (38%) ratio 40     - PFT's 02/08/10 FEV1 1.30 (64%) ratio 45, 19% resp to B2 and DLC046% corrects to 63%    - HFA technique 75% Mar 04, 2009 >  75% October 05, 2010> 90% November 09, 2010      - 02 dep at hs since April 2010, no desat walking October 05, 2010     - Prednsone maint April 25, 2009     - Add on Qvar  80   December 28, 2009 > d/c November 09, 2010 as not effective    - Dulera 200 started 11/09/10 >> much better December 21, 2010  Hypertension Hives 07-2007 Allergic rhinitis    - Sinus ct 01/2009 wnl Goiter 2009 > resected 12/29/08 Health Maintenance......................................[facility] [place]     - Pneumovax September 15, 2009      - DEXA 10/30/2009  T spine  -1.7,  L Fem -1.9,  R -2.1 cw ostopenia Complex med regimen--Meds reviewed with pt education and computerized med calendar May 09, 2010  Vital Signs:  Patient profile:   65 year old female Weight:      153.25 pounds O2 Sat:      93 % on Room air Temp:     98.8 degrees F oral Pulse rate:   96 / minute BP sitting:   104 / 56  (left arm)  Vitals Entered By: Vernie Murders (December 21, 2010 9:16 AM)  O2 Flow:  Room air  Physical Exam  Additional Exam:   ambulatory wf nad moderatly cushingnoid  wt 144 April 25, 2009 >  156 May 09, 2010 > 154 November 09, 2010  > 153 December 21, 2010  HEENT:edentullous, nl turbinates, Post pharynx w/ minimal white patches.  Nl external ear canals without cough reflex NECK :  without JVD/Nodes/TM/ nl carotid upstrokes bilaterally LUNGS: barrel chest, marked increased t exp CV:  RRR  no s3  or murmur or increase in P2, no edema   ABD:  soft and nontender with nl excursion in the supine position. No bruits or organomegaly, bowel sounds nl MS:  warm without deformities, calf tenderness, cyanosis or clubbing  Impression & Recommendations:  Problem # 1:  COPD (ICD-496) DDX of  difficult airways managment all start with A and  include Adherence, Ace Inhibitors, Acid Reflux, Active Sinus Disease, Alpha 1 Antitripsin deficiency, Anxiety masquerading as Airways dz,  ABPA,  allergy(esp in young), Aspiration (esp in elderly), Adverse effects of DPI,  Active smokers, plus two Bs  = Bronchiectasis and Beta blocker use..and one C= CHF   Adherence better with use of med calendar   ? adverse effect of inhalers > off symbicort and qvar and replace with trial of dulera >> better so no change rx, return for pfts  I had an extended discussion with the patient today lasting 15 to 20 minutes of a 25 minute visit on the following issues:  The goal with a chronic steroid dependent illness is always arriving at the lowest effective dose that controls the disease/symptoms and not accepting a set "formula" which is based on statistics that don't take into accound individual variability or the natural hx of the dz in every individual patient, which may well vary over time.   For now try floor of 5 mg per day  Other Orders: Est. Patient Level IV (16109)  Patient Instructions: 1)  Try to reduce prednisone to 10 mg one half daily as your floor with option to back to 20 mg per day if worsen 2)  See calendar for specific medication instructions and bring it back for each and every office visit for every healthcare provider you see.  Without it,  you may not receive the best quality medical care that we feel you deserve.  3)  Return to office in 3 months, sooner if needed with pfts Prescriptions: VALIUM 5 MG TABS (DIAZEPAM) one every 6 hours if needed  #120 x 0   Entered and Authorized by:   Nyoka Cowden MD   Signed by:   Nyoka Cowden MD on 12/21/2010   Method used:   Print then Give to Patient   RxID:   [id number] 200-5 MCG/ACT AERO (MOMETASONE FURO-FORMOTEROL FUM) 2 puffs first thing  in am and 2 puffs again in pm about 12 hours later  #1 x 11   Entered and Authorized by:   Nyoka Cowden MD   Signed by:   Nyoka Cowden MD on 12/21/2010   Method used:   Print then Give to Patient   RxID:   [id number]

## 2011-01-24 LAB — CBC
MCHC: 34.8 g/dL (ref 30.0–36.0)
MCHC: 34.8 g/dL (ref 30.0–36.0)
MCV: 94.9 fL (ref 78.0–100.0)
Platelets: 267 10*3/uL (ref 150–400)
Platelets: 280 10*3/uL (ref 150–400)
RBC: 4.12 MIL/uL (ref 3.87–5.11)
WBC: 12.1 10*3/uL — ABNORMAL HIGH (ref 4.0–10.5)
WBC: 15.6 10*3/uL — ABNORMAL HIGH (ref 4.0–10.5)

## 2011-01-24 LAB — DIFFERENTIAL
Basophils Relative: 0 % (ref 0–1)
Lymphs Abs: 0.9 10*3/uL (ref 0.7–4.0)
Monocytes Relative: 8 % (ref 3–12)
Neutro Abs: 9.9 10*3/uL — ABNORMAL HIGH (ref 1.7–7.7)
Neutrophils Relative %: 81 % — ABNORMAL HIGH (ref 43–77)

## 2011-01-24 LAB — BLOOD GAS, ARTERIAL
Bicarbonate: 18.5 mEq/L — ABNORMAL LOW (ref 20.0–24.0)
Drawn by: 308601
O2 Content: 4 L/min
O2 Saturation: 97.7 %
Patient temperature: 98.6
pH, Arterial: 7.35 (ref 7.350–7.400)

## 2011-01-24 LAB — URINE CULTURE: Colony Count: >=100000

## 2011-01-24 LAB — POCT CARDIAC MARKERS: Troponin i, poc: <0.05 ng/mL (ref 0.00–0.09)

## 2011-01-24 LAB — CARDIAC PANEL(CRET KIN+CKTOT+MB+TROPI)
CK, MB: 8.6 ng/mL — ABNORMAL HIGH (ref 0.3–4.0)
Total CK: 213 U/L — ABNORMAL HIGH (ref 7–177)
Total CK: 230 U/L — ABNORMAL HIGH (ref 7–177)
Total CK: 240 U/L — ABNORMAL HIGH (ref 7–177)
Troponin I: 0.02 ng/mL (ref 0.00–0.06)

## 2011-01-24 LAB — URINALYSIS, ROUTINE W REFLEX MICROSCOPIC
Nitrite: POSITIVE — AB
Protein, ur: 30 mg/dL — AB
Specific Gravity, Urine: 1.011 (ref 1.005–1.030)
Urobilinogen, UA: 0.2 mg/dL (ref 0.0–1.0)

## 2011-01-24 LAB — BASIC METABOLIC PANEL
BUN: 11 mg/dL (ref 6–23)
BUN: 9 mg/dL (ref 6–23)
Calcium: 8.8 mg/dL (ref 8.4–10.5)
Calcium: 9.2 mg/dL (ref 8.4–10.5)
Chloride: 111 mEq/L (ref 96–112)
Creatinine, Ser: 0.68 mg/dL (ref 0.4–1.2)
Creatinine, Ser: 0.73 mg/dL (ref 0.4–1.2)
GFR calc Af Amer: >60 mL/min (ref >60)

## 2011-01-24 LAB — D-DIMER, QUANTITATIVE: D-Dimer, Quant: 0.6 ug/mL-FEU — ABNORMAL HIGH (ref 0.00–0.48)

## 2011-01-24 LAB — BRAIN NATRIURETIC PEPTIDE
Pro B Natriuretic peptide (BNP): 128 pg/mL — ABNORMAL HIGH (ref 0.0–100.0)
Pro B Natriuretic peptide (BNP): 220 pg/mL — ABNORMAL HIGH (ref 0.0–100.0)

## 2011-01-24 LAB — URINE MICROSCOPIC-ADD ON

## 2011-01-25 LAB — CALCIUM
Calcium: 8.5 mg/dL (ref 8.4–10.5)
Calcium: 9.1 mg/dL (ref 8.4–10.5)

## 2011-01-25 LAB — BASIC METABOLIC PANEL
CO2: 26 mEq/L (ref 19–32)
Calcium: 9.9 mg/dL (ref 8.4–10.5)
Creatinine, Ser: 1.03 mg/dL (ref 0.4–1.2)
Glucose, Bld: 97 mg/dL (ref 70–99)

## 2011-01-25 LAB — CBC
MCHC: 35.3 g/dL (ref 30.0–36.0)
Platelets: 315 10*3/uL (ref 150–400)
RDW: 13.3 % (ref 11.5–15.5)

## 2011-02-27 NOTE — Op Note (Signed)
[name], [name]              ACCOUNT NO.:  [id number]   MEDICAL RECORD NO.:  [id number]          PATIENT TYPE:  INP   LOCATION:  5128                         FACILITY:  [facility]   PHYSICIAN:  Jefry H. Pollyann Kennedy, MD     DATE OF BIRTH:  1946/05/26   DATE OF PROCEDURE:  DATE OF DISCHARGE:                               OPERATIVE REPORT   PREOPERATIVE DIAGNOSES:  Substernal goiter with respiratory obstruction.   POSTOPERATIVE DIAGNOSIS:  Substernal goiter with respiratory  obstruction.   PROCEDURE:  Thyroidectomy.   SURGEON:  Jefry H. Pollyann Kennedy, MD   REFERRING PHYSICIAN:  Georgina Quint. Plotnikov, MD   COMPLICATIONS:  Nil.   FINDINGS:  Large substernal goiter on the left, appear to be separate  from the remainder of the thyroid gland, what appeared to be the left  lobe was removed separately, but sent as one specimen with the goiter.  There was no palpable gland on the right side.   HISTORY:  A 65 year old with a history of a slowly enlarging substernal  goiter on the left side.  She has been having some shortness of breath  since this has enlarged.  Risks, benefits, alternatives, and  complications of the procedure were explained to the patient who seemed  to understand and agreed for surgery.   OPERATIVE PROCEDURE:  The patient was taken to the operating room and  placed on the operating table in supine position.  Following induction  of general endotracheal anesthesia, the neck was prepped and draped in  the standard fashion.  An incidental large skin tag on the left side of  the neck was removed using electrocautery and sent separately for  pathologic evaluation.  There is no suturing necessary.  A low-collar  incision was outlined with a marking pen and created with  electrocautery.  Dissection continued through the platysmal level.  A  subplatysmal flap was developed superiorly to the thyroid notch and  inferiorly to the clavicle.  Self-retaining thyroid retractor was used  throughout the case.  The midline fascia was divided exposing what  appeared to be the left lobe of the thyroid.  The trachea was  significantly deviated to the right side.  The left lobe was dissected  out in its entirety, and did not appear to be attached to the deeper  mass or to the right side of the thyroid.  There was no parathyroid  identified.  The recurrent nerve was identified and preserved.  The  superior and inferior vasculature was ligated between clamps and  divided.  The specimen was delivered from the wound.  Further dissection  through deeper plane was then accomplished.  The goiter was exposed.  Careful dissection using blunt finger dissection predominantly was used  to deliver the substernal goiter.  It extended down to the level of  aortic arch laterally just medial to the carotid artery and medially  wrapped partially around the trachea from about 12 o'clock anteriorly to  about 4 o'clock posteriorly, looking from top to bottom.  The fiber  spans and vascular attachments were divided using electrocautery and  ligatures as necessary.  The entire gland was delivered and sent for  pathologic evaluation.  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  The wound was irrigated with  saline and suctioned.  Bipolar cautery was used for completion of  hemostasis.  The wound was then closed in layers using 3-0 chromic on  the midline fascia and the platysmal layer, and 5-0 nylon running on the  skin.  A 10-French round drain was left in the wound, exited through the  right side, secured in place with a silk suture.  The patient was awake  and extubated and transferred to recovery room in stable condition.      Jefry H. Pollyann Kennedy, MD  Electronically Signed     JHR/MEDQ  D:  12/29/2008  T:  12/30/2008  Job:  [id number]   cc:   Georgina Quint. Plotnikov, MD

## 2011-02-27 NOTE — Discharge Summary (Signed)
[name], [name]              ACCOUNT NO.:  [id number]   MEDICAL RECORD NO.:  [id number]          PATIENT TYPE:  INP   LOCATION:  1504                         FACILITY:  [facility]   PHYSICIAN:  Rosalyn Gess. Norins, MD  DATE OF BIRTH:  1946-07-21   DATE OF ADMISSION:  01/31/2009  DATE OF DISCHARGE:  02/04/2009                               DISCHARGE SUMMARY   ADMITTING DIAGNOSES:  Asthmatic exacerbation of chronic obstructive  pulmonary disease.   DISCHARGE DIAGNOSES:  1. Exacerbation of chronic obstructive pulmonary disease.  2. Chronic dyspnea on exertion of unknown etiology.   CONSULTANTS:  Kalman Shan, M.D. for pulmonary critical care.   PROCEDURES:  1. Chest x-ray on the day of admission, which showed no acute      findings.  2. Computerized tomographic angiogram performed the day of admission      to rule out pulmonary embolus, which showed chronic obstructive      pulmonary disease with hyperinflation, but no infiltrates, edema or      effusions.  Suboptimal examination of the pulmonary arteries due to      respiratory motion, but no gross central pulmonary emboli were      identified.  There was asymmetric enlargement of the  left lobe of      the thyroid consistent with goiter.  3. Chest x-ray, February 01, 2009, with chronic obstructive pulmonary      disease and with no acute abnormalities or changes since the      previous study.  4. Computerized tomography of the sinuses February 01, 2009, which was      negative for any signs of chronic sinusitis.  5. Pulmonary function tests performed February 03, 2009, which revealed      no extrinsic obstruction; however, which revealed the patient to      have airway obstruction; final report is pending from a pulmonary.      This was reviewed by Dr. Marchelle Gearing prior the patient being      discharged.   HISTORY OF THE PRESENT ILLNESS:  The patient is a 65 year old woman with  a history of COPD with an asthmatic component who  presented to the  emergency department for severe shortness of breath.  She reports she is  having a severe asthma attack and COPD exacerbation similar to an  episode in February.  The patient reports she is also been having a  problem with chronic shortness of breath and wheezing for the past  several months, particularly worse since a trip to [place] in January,  which has her limited her activities of daily living.   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  She  underwent resection in March 2010 in an effort to relieve any of  her  asthmatic extrinsic compression symptoms.  This failed to relieve her  symptoms, but she recovered nicely from surgery.  She actually was seen  during this hospitalization by Dr. Pollyann Kennedy.   Within the last 24 hours prior to admission the patient reports her  shortness of breath has become worse with associated orthopnea,  wheezing  and cough productive of white sputum.  She has been using her albuterol  inhaler around-the-clock without improvement.  In the emergency  department she was in extremis, but did not require intubation.  The  patient was given antibiotics in the emergency department along with  Solu-Medrol and nebulizer treatments; and, is subsequently admitted.   PAST MEDICAL HISTORY:  Please see the admission note for the past  medical history.   FAMILY HISTORY:  Please see the admission note for the family history.   SOCIAL HISTORY:  Please see the admission note for the social history.   PHYSICAL EXAMINATION:  Please see the admission note for the physical  examination.   HOSPITAL COURSE:  1. Pulmonary: The patient was treated with IV steroids as well as      being started on antibiotics for possible infection, although her      white count was essentially unremarkable; and, there was no      evidence of pneumonia or infection on chest x-ray.  Of note, the      patient had had pulmonary function studies  approximately a month      prior to this admission that were unchanged from 2008.  The patient      was to see Dr. Francella Solian for follow up.   The patient was continued on IV steroids and antibiotics.  She was seen  in consultation by the pulmonary service.  CT was performed to rule out  sinusitis as an exacerbating factor of her asthmatic attack, which was  negative.  Because of her problem with chronic dyspnea the patient did  have an evaluation for cardiac disease with cardiac enzymes that were  negative as well as a 2-D echo that showed normal cardiac function and  normal valve function with no wall motion abnormalities.  The patient  continued to improve and she was able to be changed to by mouth  antibiotics on February 03, 2009.  Also she had been changed to oral  prednisone on February 02, 2009.  The patient has had oxygen saturations  checked while she has been ambulating, which revealed a drop to 87% on  room air.  So that she would benefit from short-term oxygen therapy at  home this will be arranged.   With the patient being stable on oral medications, with no acute  respiratory distress and with no need for further diagnostic evaluation  she is felt to be ready for discharge home.  She is to continue oral  antibiotics, a prednisone taper and oxygen support until she sees Dr.  Sherene Sires in follow up.   1. UTI.  The patient did have a urinary tract infection on admission.      This was adequately covered by the antibiotics she received for her      respiratory function and will be covered with Ceftin.   Discharge Examination:  VITAL SIGNS:  Temperature 98.6, blood pressure 145/95, heart rate 80,  respirations 20, and oxygen saturation 95% on 1.5 meters per nasal  cannula.  GENERAL APPEARANCE:  This is a pleasant woman sitting upright in bed in  no acute distress.  HEENT:  The head, eyes, ears, nose, and throat exam is unremarkable.  NECK:  The neck is supple.  CHEST:  Patient is  moving air better than she has been, but she does  have end-expiratory wheezing throughout.  She does have a cough  associated with deep inspiration.  HEART:  Cardiovascular reveals 2+ radial  pulse.  Her precordium is  quiet.  She has a regular rate and rhythm.  ABDOMEN:  The abdomen is soft.  EXTREMITIES:  The extremities are without edema.  No further examination  conducted.   Final Laboratory Data:  Cardiac panel on April 21 and 22, 2010 revealed CK of 240 with a  troponin of 0.05; CK of 213 with troponin of 0.02; and, CK of 230 with a  troponin 0.02.  Beta-natriuretic peptide from February 02, 2009 was just  above the normal range at 128.  Urine culture came back at 100,000  colonies of E coli that was pansensitive.  Final BMET from February 01, 2009 was with a sodium of 140, potassium of 3.9, chloride of 111, CO2 of  23, BUN of 11, creatinine of 0.73, and glucose of 162 (steroid effect).  Final CBC on February 01, 2009 was with a white count of 15,600, hemoglobin  of 12.6 grams and platelet count of 280,000.  D-dimer from January 31, 2009 was 0.6.   DISCHARGE MEDICATIONS:  1. The patient will resume her home medications including albuterol      metered dose inhaler to be used as a rescue medication only.  She      was carefully instructed that if she needs to use her rescue      medication daily, specifically if she needs to use it more than      three times a day she will need to call for medical attention.  2. The patient will continue on Spiriva inhaler 18 mcg daily.  3. The patient will continue Azor 5/40 once daily.  4. Spironolactone 25 mg daily.  5. Vitamin D 1,000 units daily.   The medications added will be:  1. Symbicort 164.5 two puffs twice a day.  2. Ceftin 250 mg twice a day for 5 days.  3. Prednisone at 40 mg daily x5 days and then 20 mg daily until she      sees Dr. Sherene Sires.   DISPOSITION:  The patient is discharged home.  She will be on home O2.  She will see Dr. Sherene Sires  for pulmonary follow up Feb 15, 2009 at 1:45 P.M.  The patient was carefully instructed that should she have progressive or  deteriorating symptoms that she is to call for immediate attention.   DISCHARGE CONDITION:  The patient's condition at the time of discharge  dictation is improved and stable.      Rosalyn Gess Norins, MD  Electronically Signed     MEN/MEDQ  D:  02/04/2009  T:  02/04/2009  Job:  [id number]   cc:   Charlaine Dalton. Sherene Sires, MD, FCCP  [address]  [place] [address]   Georgina Quint. Plotnikov, MD  [address]  [place]  [place] [address]

## 2011-02-27 NOTE — Assessment & Plan Note (Signed)
[facility]                             PULMONARY OFFICE NOTE   [name], [name]                       MRN:          [id number]  DATE:06/10/2007                            DOB:          1945-11-15    HISTORY:  A 65 year old white female who quit smoking in December 2007,  with chronic obstructive pulmonary disease with minimum asthmatic  component apparent when I saw her on December 03, 2006, and explained to  her the importance of maintaining Spiriva and keeping track of the need  for albuterol, which might indicate an asthmatic component.  Although  she understood the instructions to continue to use Spiriva maintenance,  she misunderstood how to use albuterol and has been having an increasing  need for albuterol ever since the weather got hot, up to four times  daily and has actually seen our nurse practitioner for a refill.  (I had  given her two albuterol refills total, to last for one year, to try to  comply with the rule of beta two's, but also apparently forgot about  that issue.)   Her only other medication is Micardis 80/25 mg once daily.   She denies any exertional chest pain.  She does have an occasional  scratchy throat but no purulent secretions, orthopnea, PND, fevers,  chills, sweats or leg swelling.   Presently she is short of breath with anything more than very slow ADLs,  better with albuterol.   On physical examination, which was done within 2 hours of her last  albuterol dose, she had stable vital signs except for a blood pressure  164/100.  HEENT:  Reveals mild nonspecific turbinate edema. Oropharynx is clear.  NECK:  Supple without cervical adenopathy or tenderness.  Trachea is  midline.  LUNGS:  Fields reveal a trace end expiratory wheeze bilaterally.  There  is marked increased expiratory time with high resonant percussion.  HEART:  A regular rhythm without murmur, gallop or rub.  ABDOMEN:  Soft, benign.  EXTREMITIES:  Warm without calf tenderness, cyanosis or clubbing.   IMPRESSION:  1. Chronic obstructive pulmonary disease by previous pulmonary      function tests that did not show significant asthmatic component.      However, note that she has had marked increase in albuterol use in      the warm weather and most likely does now have an asthmatic      component that is not going to burn itself out, status post      smoking cessation six months ago.  I therefore recommended trying      her on Symbicort 160/4.5 two puffs b.i.d., but if she finds that      this is ineffective, then we need to look closer at the issue of      other mechanisms for dyspnea, such as occult reflux or sinus      disease.  If on the other hand, the Symbicort works to optimize her      daily function and minimize her need for albuterol, then we can      simply  refill that here yearly or through Dr. George Hugh office,      with the option of stepping down to 80/4.5 2 bid if doing great      after 3 months.  2. Poorly-controlled hypertension.  This sometimes does correlate with      poor control of asthma.  I doubt there is heart failure present;      note the absence of nocturnal exacerbations of any of her      respiratory symptoms.   A pulmonary followup can be again p.r.n. as long as the patient observes  the rule of 2's for albuterol use and is not limited in terms of daily  activities, whether in cold weather or in hot.     Charlaine Dalton. Sherene Sires, MD, [facility]  Electronically Signed    MBW/MedQ  DD: 06/10/2007  DT: 06/11/2007  Job #: [id number]   cc:   Gregary Signs A. Everardo All, MD

## 2011-02-27 NOTE — H&P (Signed)
[name], [name]              ACCOUNT NO.:  [id number]   MEDICAL RECORD NO.:  [id number]          PATIENT TYPE:  INP   LOCATION:  1232                         FACILITY:  [facility]   PHYSICIAN:  Darryl D. Prime, MD    DATE OF BIRTH:  05/15/1946   DATE OF ADMISSION:  01/31/2009  DATE OF DISCHARGE:                              HISTORY & PHYSICAL   PRIMARY CARE PHYSICIAN:  Georgina Quint. Plotnikov, M.D.   PULMONOLOGIST:  Charlaine Dalton. Sherene Sires, MD, FCCP.   CHIEF COMPLAINT:  Shortness of breath.   HISTORY OF PRESENT ILLNESS:  [name] is a 65 year old female with a  history of severe COPD with an asthmatic component who presents with  shortness of breath.  Patient notes a severe asthma attack, COPD  exacerbation, in February, and she notes she has not been the same  since.  She has a history of chronic shortness of breath and wheezing,  even before that, with significant limitations of her activities of  daily living.  She apparently has a history of a left thyroid mass that  is benign, which was removed in March, 2010 in an effort to possibly  relieve her asthma symptoms, per patient.  She notes these symptoms have  not been relieved.  Patient notes over the last week significant  shortness of breath that has begun being worse.  Within the last 24  hours, her shortness of breath worsened even further with associated  orthopnea, wheezing, cough with white productive sputum, which is  increased from her baseline in amount, to where she called EMS.  Patient  notes she has been taking nebulizers around the clock with no  improvement of symptoms.  She notes her symptoms particularly worsen  during the hot weather, and it is now warming up.   Patient denies any fever.  She notably keeps a low grade fever.  She is  up to date with her vaccines, including H1N1.  She denies any sick  contacts.   In the emergency room, she was in extremis, almost requiring an  intubation.  Patient was brought in by  EMS, who have given her Solu-  Medrol and nebulizers en route.  Patient was also given Ativan,  magnesium, albuterol, and erythromycin in the emergency department with  significant relief of her symptoms.  Admission was requested.   MEDICAL HISTORY/PAST SURGICAL HISTORY:  History as above.  She also has  a history of hypertension and a thyroid tumor, as above.  She notes it  was a goiter.   ALLERGIES:  No known drug allergies.   MEDICATIONS:  1. Spironolactone 25 mg daily.  2. Albuterol inhaler as needed, 2 puffs every 6 hours.  3. Albuterol/Atrovent nebulizers q.6h.  4. Asmanex daily.  5. Amlodipine/olmesartan combination.  6. Azor 5/40 mg daily.  7. Spiriva 18 mcg inhaler daily.  8. Vitamin D 1000 units daily.   SOCIAL HISTORY:  She quit tobacco in 2007.  Smoked for 40 years.  Prior  to that 1 pack per day.  No alcohol or illicit drug use.  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   FAMILY HISTORY:  Negative for asthma or atopy.   REVIEW OF SYSTEMS:  A 14-point review of systems is negative, as stated  above.   PHYSICAL EXAMINATION:  Blood pressure on admission was 205/85.  It did  decrease to 130 systolic, now back up to 173/62.  Temperature 98.7 with  a pulse of 117, respiratory rate of 14.  Sats are 94% on 4 liters nasal  cannula.  GENERAL:  Patient appears to be in mild respiratory distress.  HEENT:  Normocephalic and atraumatic.  Pupils are equal, round and  reactive to light.  Extraocular movements are intact.  Oropharynx is  dry.  NECK:  Supple with no lymphadenopathy.  LUNGS:  Significant wheezing with poor inspiratory expansion.  The  wheezing is diffuse.  She still has a prolonged expiratory phase.  CARDIOVASCULAR:  Regular rhythm with a fast rate with no murmurs.  ABDOMEN:  Soft, nontender, nondistended with no hepatosplenomegaly.  EXTREMITIES:  No clubbing, cyanosis or edema.  She is  sitting upright  with the use of her accessory muscles.  NEUROLOGIC:  She is alert and oriented x4 with cranial nerves II-XII  grossly intact.  Strength and sensation grossly intact.  SKIN:  No rashes.   Urinalysis reveals WBCs 21 to 50, RBCs 3 to 6, bacteria few, moderate  blood, positive nitrite, moderate leukocyte esterase.  ABG showed a pH  of 7.35, CO2 35, O2 of 98 on 4 liters nasal cannula.  Sats 97%.  Bicarb  was 18.   EKG showed sinus tachycardia.  Initially, it was 131 beats per minute  with artifact.   Cardiac markers at 0256 were negative.  Sodium was 137 with a potassium  of 3.3, chloride 107, bicarb 19, BUN 9, creatinine 0.68, glucose 133,  calcium 8.8.  White count 12.1 with a hemoglobin of 13.6, hematocrit  39.1, platelets 261, segs 81.   Chest x-ray is unremarkable.  There is no acute cardiopulmonary disease.  No change from March, 2010 except for decreased shadows in the area of  the neck.  There is improvement of the left-to-right tracheal deviation,  which she had in March, 2010.   ASSESSMENT/PLAN:  This is a patient with a history of severe obstructive  disease who now presents significantly decompensated.  At this time, we  will admit her and check a D-dimer to rule out associated or precipitant  pulmonary thromboembolism.  We will get a urine culture and treat her  with Rocephin for the urinary tract infection.  For her chronic  obstructive pulmonary disease exacerbation, in addition to the Rocephin,  she will receive azithromycin.  She has already received the first dose  in the ER here.  We will repeat her potassium, place her on nebulizers,  oxygen,  steroids, and get a sputum culture.  Will get pulmonary to see her, as  her symptoms are significant, and they have been following her.  GI and  DVT prophylaxis will be ordered.  We will continue her home  antihypertensive medications.      Darryl D. Prime, MD  Electronically Signed     DDP/MEDQ  D:   01/31/2009  T:  01/31/2009  Job:  [id number]

## 2011-02-27 NOTE — Assessment & Plan Note (Signed)
 [facility]                             PULMONARY OFFICE NOTE   [name], [name]                       MRN:          [id number]  DATE:05/16/2007                            DOB:          1945/12/09    HISTORY OF PRESENT ILLNESS:  The patient is a 65 year old white female  patient of Dr. Thurston Hole who has a known history of COPD who quit smoking  in December of 2007.  She presents today with a 1-week history of  increased minimally productive cough and wheezing and shortness of  breath with activity.  The patient denies any purulent sputum, fever,  chest pain, orthopnea, PND or leg swelling.   The patient is maintained is Spiriva daily.   PAST MEDICAL HISTORY:  Reviewed.   CURRENT MEDICATIONS:  Reviewed.   PHYSICAL EXAMINATION:  The patient is a pleasant female in no acute  distress.  She is afebrile with stable vital signs.  HEENT:  Unremarkable.  NECK:  Supple without cervical adenopathy, no JVD.  Lung sounds reveal a few expiratory wheezes bilaterally.  CARDIAC:  Regular rate.  ABDOMEN:  Soft and nontender.  EXTREMITIES:  Warm without any edema.   IMPRESSION AND PLAN:  Acute chronic obstructive pulmonary disease  exacerbation.  Patient is to get a prednisone pack over the next week,  add in Mucinex DM twice a day.  The patient will continue on Spiriva  daily, return back here in 2 weeks or sooner if needed with Dr. Sherene [name].      Rubye Oaks, NP  Electronically Signed      [name]. Laura Sires, MD, [facility]  Electronically Signed   TP/MedQ  DD: 05/16/2007  DT: 05/17/2007  Job #: [id number]

## 2011-02-27 NOTE — Discharge Summary (Signed)
[name], [name]              ACCOUNT NO.:  [id number]   MEDICAL RECORD NO.:  [id number]          PATIENT TYPE:  INP   LOCATION:  1341                         FACILITY:  [facility]   PHYSICIAN:  Rosalyn Gess. Norins, MD  DATE OF BIRTH:  [birth year]   DATE OF ADMISSION:  07/07/2007  DATE OF DISCHARGE:  07/08/2007                               DISCHARGE SUMMARY   TWENTY-FOUR HOUR EVALUATION:   ADMISSION DIAGNOSES:  1. Hypoxemia.  2. Possible bronchitis.  3. Chronic obstructive pulmonary disease.  4. Hypertension.   DISCHARGE DIAGNOSES:  1. Bronchitis with hypoxemia.  2. Hypertension, stable off medication.   CONSULTANTS:  None.   PROCEDURES:  PA and lateral chest x-ray at admission,m which showed no  active lung disease.  Superior mediastinal mass evaluated by ultrasound  of the head and neck showing a large left lobe of the thyroid.   HISTORY OF PRESENT ILLNESS:  The patient is a 65 year old woman I have  seen in the office because of cramps and back pain.  She attributed this  to Micardis when she saw Dr. Everardo All on September 19.  She had been on  medication for 6 months.  She was changed to Benicar 40/25 mg.  She was  seen back in follow-up on the day of admission.  She continued to have  back, shoulder and neck discomfort.  Of note, the patient reports that  her back pain and discomfort had been a short-term issue for  approximately a week or so.  Also during that time she had had a mild  cough, mild increased shortness of breath.   In the office the patient was noted to have an O2 saturation of 83% on  room air with her baseline being 94-96%..  She had a heart rate of 101.  Because of her symptoms of hypoxemia, she was admitted for oxygen  support.   HOSPITAL COURSE:  The patient had chest x-ray at admission, which showed  no pneumonia.  Question of possible bronchitis by clinical exam.  The  patient was started on Avelox p.o.  The patient was put on oxygen at 2 L  per  nasal cannula, on which she had an O2 saturation of 100%.  The  patient was ambulated off oxygen on the night of September 23 at  approximately midnight and had an oxygen saturation of 96% after  walking.  O2 saturation at rest was 97%.  The patient was noted on the  morning of discharge, 0500 hours, to have an O2 saturation of 86% off of  oxygen.   DISCHARGE EXAMINATION:  Temperature was 98, blood pressure 91/64, heart  rate 74, respirations 18, O2 saturation was 98% on 2 L.  GENERAL APPEARANCE:  A well-nourished woman.  She is in no acute  distress.  She has no increased work of breathing.  She is warm to the  touch.  CHEST:  The patient is moving air well with no rales, wheezes or  rhonchi.  CARDIOVASCULAR:  2+ radial pulse, no JVD or carotid bruits.   ASSESSMENT/PLAN:  1. Pulmonary:  Patient with chronic obstructive pulmonary disease  on      bronchodilators and inhaled steroids, who presented with hypoxemia,      possibly related to bronchitis.  Her oxygen saturation last p.m.      was satisfactory, slightly low this morning.  The patient is in no      distress with no increased work of breathing.  Plan:  The patient      to be discharged home.  She is to continue on Avelox.  She is to      follow up with Dr. Posey Rea on the 26th.  2. Hypertension:  The patient's blood pressure remains low.  I doubt      that she had a reaction to Micardis or Benicar.  At this time with      her blood pressure being low, she will be discharged without blood      pressure medication, and we will defer to follow-up with Dr.      Posey Rea on the 26th as noted.   In summary, the patient was admitted with hypoxemia and bronchitis, who  seems to be stable at the time of discharge.      Rosalyn Gess Norins, MD  Electronically Signed     MEN/MEDQ  D:  07/08/2007  T:  07/08/2007  Job:  [id number]   cc:   Georgina Quint. Plotnikov, MD  [address]  [place]  [place] [address]

## 2011-03-01 ENCOUNTER — Other Ambulatory Visit: Payer: Self-pay | Admitting: Internal Medicine

## 2011-03-02 NOTE — Assessment & Plan Note (Signed)
[name]                             PULMONARY OFFICE NOTE   [name], [name]                       MRN:          [id number]  DATE:12/03/2006                            DOB:          September 22, 1946    REASON FOR CONSULTATION:  Is asthma.   HISTORY:  This is a 65 year old white female, status post smoking  cessation the day after Christmas in 2007, who complains of chronic  dyspnea doing anything more than very slow ADLs, with really very little  variability for obvious triggering factors except it is worse when  she gets real hot, better when it is cold.  For instance, she has  trouble getting the trash to the street, doing heavy housework or  bringing in the groceries from the car.  She does feel a little bit  better after she uses her albuterol and also is better since she stopped  her cigarette smoking.  She has minimal cough productive of thick white  mucus that is better also since she quit smoking with no exertional  pleuritic chest pain, orthopnea, PND or leg swelling, exercise or reflux  symptoms.   PAST MEDICAL HISTORY:  Is significant for hypertension.  Significant for  thyroid tumors, with all of her biopsies negative to date, but needing  left thyroidectomy per records.   ALLERGIES:  None known.   MEDICATIONS:  Micardis 80/25 one daily.   SOCIAL HISTORY:  She quit smoking October 09, 2006.  Has worked as a  Conservation officer, [employer] with no unusual travel, pet or hobby exposure.   FAMILY HISTORY:  Is recorded in detail on the worksheet.  Significant  for the absence of atopy or asthma.   REVIEW OF SYSTEMS:  Taken in detail on the worksheet, significant for  the absence of additional complaints.   PHYSICAL EXAMINATION:  She is a pleasant, ambulatory, elderly, thin  white female in no acute distress.  Stable vital signs.  HEENT:  Is unremarkable.  Pharynx clear.  Dentition is intact.  Nasal  turbinates normal.  Ear canals clear bilaterally.  NECK:  Was supple without cervical adenopathy or tenderness.  Trachea is  midline, no thyromegaly.  LUNGS:  Fields reveal diminished breath sounds with positive mid  inspiratory Hoover's sign but there is no expiratory wheeze.  There is regular rhythm without murmur, gallop or rub.  ABDOMEN:  Soft, benign.  EXTREMITIES:  Warm without calf tenderness, cyanosis, clubbing or edema.   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  I believe it is  moderate in severity but would not preclude thyroid surgery.  I would  recommend, however, preoperative pulmonary function tests to be  scheduled at the next available setting and recommend that she start  Spiriva 1 capsule daily now, continue to use the albuterol on an as  needed basis.   I explained to her that maintaining smoking abstinence was more  important then anything that I or any inhaler could do for her and I  believe she is committed long-term to maintaining abstinence.     Casimiro Needle  Denice Paradise, MD, [facility]  Electronically Signed    MBW/MedQ  DD: 12/04/2006  DT: 12/04/2006  Job #: [id number]   cc:   Gregary Signs A. Everardo All, MD

## 2011-03-02 NOTE — Assessment & Plan Note (Signed)
[facility]                             PULMONARY OFFICE NOTE   ARZELLA, REHMANN                       MRN:          [id number]  DATE:12/17/2006                            DOB:          [birth year]    HISTORY:  A 65 year old white female seen on February 19 with complaints  of dyspnea just bringing the groceries in to the house, started  empirically on Spiriva one daily with marked improvement.  Note however  that she also quit smoking effective October 09, 2006, with an FE1 of  68% predicted documented on December 05, 2006 and no significant  improvement after bronchodilators.  She tells me that she is better  than ever with no significant limitation and needing albuterol not more  than once or twice a week.   PHYSICAL EXAMINATION:  On physical examination she is a pleasant  ambulatory white female in no acute distress, stable vital signs.  HEENT:  Unremarkable.  Pharynx clear.  LUNG FIELDS:  Clear is clear of wheezing but does have mild hyper  resonance percussion.  Hoover sign is positive at the end of  inspiration.  HEART:  Regular rate and rhythm without murmurs, rubs or gallops.  ABDOMEN:  Soft, benign.  EXTREMITIES:  Warm, without calf tenderness, cyanosis, clubbing or  edema.  HEME SATURATION:  96% on room air.   I reviewed the findings of PFTs with her in detail.  I also reviewed MDI  technique which improved from 25% baseline up to 75%.   IMPRESSION:  This patient has moderate COPD which probably has  benefitted from stopping smoking more than it has from Spiriva.  If she  has reached the level of activity that she desires at this point, she  can certainly try off the Spiriva but should not use it p.r.n.  For this  purpose, I have reserved the use of albuterol and asked her to observe  if she does decide to stop the Spiriva whether she notices an increase  in limiting symptoms of dyspnea and/or increased need for albuterol.  In  either case, she would need to start back on Spiriva maintenance.   The most important aspect of her care is maintaining smoking abstinence,  not choice of position or inhalers however.   Pulmonary followup in this setting can be p.r.n.     Charlaine Dalton. Sherene Sires, MD, [facility]  Electronically Signed    MBW/MedQ  DD: 12/17/2006  DT: 12/17/2006  Job #: [id number]   cc:   Georgina Quint. Plotnikov, MD

## 2011-03-16 ENCOUNTER — Telehealth: Payer: Self-pay | Admitting: Internal Medicine

## 2011-03-16 MED ORDER — ALBUTEROL SULFATE HFA 108 (90 BASE) MCG/ACT IN AERS: 2.0000 | INHALATION_SPRAY | Freq: Four times a day (QID) | RESPIRATORY_TRACT | Status: DC | PRN

## 2011-03-16 NOTE — Telephone Encounter (Signed)
proair has been sent to the pharmacy for the pt---with no refills--pt needs ov for further refills.  No answer at home number and not able to leave a message.

## 2011-03-19 ENCOUNTER — Telehealth: Payer: Self-pay | Admitting: Internal Medicine

## 2011-03-19 NOTE — Telephone Encounter (Signed)
Pt has appt as stated below. Nothing further needed.Carron Curie, CMA

## 2011-03-19 NOTE — Telephone Encounter (Signed)
Duplicate phone message taken.  Pls see phone message dated 03/16/11 for additional information.

## 2011-03-19 NOTE — Telephone Encounter (Signed)
[id number]

## 2011-03-19 NOTE — Telephone Encounter (Signed)
Patient called back stated that she was returning a call. She has an appointment for a PFT and ROV with Dr Sherene Sires 04/30/11. She has also decreased her prednisone for 1 a day to 1/2 a day every other day. She can be reached [phone]. [name]

## 2011-03-23 ENCOUNTER — Other Ambulatory Visit: Payer: Self-pay | Admitting: Internal Medicine

## 2011-03-23 MED ORDER — DIAZEPAM 5 MG PO TABS: 5.0000 mg | ORAL_TABLET | Freq: Four times a day (QID) | ORAL | Status: AC | PRN

## 2011-03-28 ENCOUNTER — Telehealth: Payer: Self-pay | Admitting: Internal Medicine

## 2011-03-28 NOTE — Telephone Encounter (Signed)
Called and spoke with pt.  Pt c/o n/v/d x 1 week.  MW is pt's pulmonary dr.  Davina Poke pt to call her PCP, Dr. Posey Rea for recs.

## 2011-04-02 ENCOUNTER — Encounter: Payer: Self-pay | Admitting: Internal Medicine

## 2011-04-02 ENCOUNTER — Ambulatory Visit (INDEPENDENT_AMBULATORY_CARE_PROVIDER_SITE_OTHER): Payer: BC Managed Care – PPO | Admitting: Internal Medicine

## 2011-04-02 ENCOUNTER — Telehealth: Payer: Self-pay

## 2011-04-02 VITALS — BP 90/60 | HR 88 | Temp 99.1°F | Resp 16 | Ht 61.5 in | Wt 136.0 lb

## 2011-04-02 DIAGNOSIS — J069 Acute upper respiratory infection, unspecified: Secondary | ICD-10-CM

## 2011-04-02 DIAGNOSIS — R197 Diarrhea, unspecified: Secondary | ICD-10-CM

## 2011-04-02 DIAGNOSIS — A09 Infectious gastroenteritis and colitis, unspecified: Secondary | ICD-10-CM

## 2011-04-02 DIAGNOSIS — R112 Nausea with vomiting, unspecified: Secondary | ICD-10-CM

## 2011-04-02 DIAGNOSIS — J449 Chronic obstructive pulmonary disease, unspecified: Secondary | ICD-10-CM

## 2011-04-02 MED ORDER — METHYLPREDNISOLONE ACETATE 80 MG/ML IJ SUSP
80.0000 mg | Freq: Once | INTRAMUSCULAR | Status: AC
  Administered 2011-04-02: 80 mg via INTRAMUSCULAR

## 2011-04-02 MED ORDER — DIPHENOXYLATE-ATROPINE 2.5-0.025 MG PO TABS: 1.0000 | ORAL_TABLET | Freq: Four times a day (QID) | ORAL | Status: DC | PRN

## 2011-04-02 MED ORDER — PROMETHAZINE HCL 12.5 MG PO TABS: 12.5000 mg | ORAL_TABLET | Freq: Four times a day (QID) | ORAL | Status: AC | PRN

## 2011-04-02 NOTE — Telephone Encounter (Signed)
[facility] Triage Call Report Triage Record Num: [id number] Operator: Martie Lee Long Patient Name: [name] Call Date & Time: 03/31/2011 8:16:42AM Patient Phone: [phone] PCP: Sonda Primes Patient Gender: Female PCP Fax : [phone] Patient DOB: 1945-12-11 Practice Name: [facility] Reason for Call: Reiko/Patient calling about vomiting diarrhea and a headache. Pt started with these sx's 1 week ago. Afebrile. Pt report that the diarrhea has slowed down, she has had none so far today. Pt c/o of being nauseated. All emergent sx r/o per Diarrhea Protocol. Homecare advice given. Protocol(s) Used: Diarrhea or Other Change in Bowel Habits Recommended Outcome per Protocol: Provide Home/Self Care Reason for Outcome: Sudden onset of diarrhea, usually with abdominal pain, nausea and sometimes vomiting, occurring within 36 hours after eating unpasteurized, raw or undercooked foods OR drinking unpurified or nonchlorinated water Care Advice: ~ HEALTH PROMOTION / MAINTENANCE ~ SYMPTOM / CONDITION MANAGEMENT Suspected Food-Borne Illness Care: Drink 2-3 quarts (2-3 liters) of low sugar content fluids, including nonprescription oral hydration solution, per day unless directed otherwise by provider. - If accompanied by vomiting, take the fluids in frequent small sips or suck on ice chips. - Do not eat solid food until vomiting subsides. - Eat easily digested foods (such as bananas, rice, applesauce, toast, cooked cereals, soup, crackers, baked or boiled potato, or baked chicken or [place] without skin). - Do not eat high fiber, high fat, high sugar content foods, or highly seasoned foods. - Do not drink caffeinated or alcoholic beverages. - Avoid milk and milk products while having symptoms. - As symptoms improve, gradually add back to diet. - Do not use antidiarrheal medications because they may slow elimination of bacteria causing the symptoms. - Application of A&D ointment or witch  hazel medicated pads may help anal irritation. - Keep activity at a minimum. - Consult your provider for advice regarding continuing prescription medication. ~ 03/31/2011 8:33:56AM Page 1 of 1 [email or web address]

## 2011-04-02 NOTE — Progress Notes (Signed)
  Subjective:    Patient ID: [name], female    DOB: 19-Dec-1945, 65 y.o.   MRN: [id number]  Emesis  Associated symptoms include diarrhea.  Diarrhea  Associated symptoms include vomiting.   C/o diarrhea x 1 week No abx recently   Review of Systems  HENT: Negative for sinus pressure.   Respiratory: Negative for choking.   Cardiovascular: Negative for leg swelling.  Gastrointestinal: Positive for nausea, vomiting and diarrhea. Negative for blood in stool, abdominal distention, anal bleeding and rectal pain.  Genitourinary: Negative for hematuria.  Musculoskeletal: Negative for joint swelling.  Psychiatric/Behavioral: The patient is nervous/anxious.        Objective:   Physical Exam  Constitutional: She appears well-developed and well-nourished. No distress.       Cushingoid face NAD  HENT:  Head: Normocephalic.  Right Ear: External ear normal.  Left Ear: External ear normal.  Nose: Nose normal.  Mouth/Throat: Oropharynx is clear and moist.  Eyes: Conjunctivae are normal. Pupils are equal, round, and reactive to light. Right eye exhibits no discharge. Left eye exhibits no discharge.  Neck: Normal range of motion. Neck supple. No JVD present. No tracheal deviation present. No thyromegaly present.  Cardiovascular: Normal rate, regular rhythm and normal heart sounds.   Pulmonary/Chest: No stridor. No respiratory distress. She has no wheezes.  Abdominal: Soft. Bowel sounds are normal. She exhibits no distension and no mass. There is no tenderness. There is no rebound and no guarding.  Musculoskeletal: She exhibits no edema and no tenderness.  Lymphadenopathy:    She has no cervical adenopathy.  Neurological: She displays normal reflexes. No cranial nerve deficit. She exhibits normal muscle tone. Coordination normal.  Skin: No rash noted. No erythema.  Psychiatric: She has a normal mood and affect. Her behavior is normal. Judgment and thought content normal.           Assessment & Plan:

## 2011-04-02 NOTE — Patient Instructions (Addendum)
Call if not better, please! Hold BP med x 2 d

## 2011-04-03 ENCOUNTER — Encounter: Payer: Self-pay | Admitting: Internal Medicine

## 2011-04-03 DIAGNOSIS — A09 Infectious gastroenteritis and colitis, unspecified: Secondary | ICD-10-CM | POA: Insufficient documentation

## 2011-04-03 DIAGNOSIS — R112 Nausea with vomiting, unspecified: Secondary | ICD-10-CM | POA: Insufficient documentation

## 2011-04-03 NOTE — Assessment & Plan Note (Signed)
See Meds Depo IM

## 2011-04-03 NOTE — Assessment & Plan Note (Signed)
See meds Stool for C diff

## 2011-04-03 NOTE — Assessment & Plan Note (Signed)
Prometh prn

## 2011-04-14 ENCOUNTER — Other Ambulatory Visit: Payer: Self-pay | Admitting: Internal Medicine

## 2011-04-19 ENCOUNTER — Other Ambulatory Visit: Payer: Self-pay | Admitting: Internal Medicine

## 2011-04-30 ENCOUNTER — Ambulatory Visit (INDEPENDENT_AMBULATORY_CARE_PROVIDER_SITE_OTHER): Payer: BC Managed Care – PPO | Admitting: Internal Medicine

## 2011-04-30 ENCOUNTER — Encounter: Payer: Self-pay | Admitting: Internal Medicine

## 2011-04-30 VITALS — BP 106/66 | HR 99 | Temp 98.8°F | Ht 62.0 in | Wt 135.0 lb

## 2011-04-30 DIAGNOSIS — J9610 Chronic respiratory failure, unspecified whether with hypoxia or hypercapnia: Secondary | ICD-10-CM

## 2011-04-30 DIAGNOSIS — J449 Chronic obstructive pulmonary disease, unspecified: Secondary | ICD-10-CM

## 2011-04-30 DIAGNOSIS — R05 Cough: Secondary | ICD-10-CM

## 2011-04-30 LAB — PULMONARY FUNCTION TEST

## 2011-04-30 MED ORDER — ALBUTEROL SULFATE HFA 108 (90 BASE) MCG/ACT IN AERS: 2.0000 | INHALATION_SPRAY | RESPIRATORY_TRACT | Status: DC | PRN

## 2011-04-30 NOTE — Progress Notes (Signed)
Subjective:     Patient ID: [name], female   DOB: 08-13-1946, 65 y.o.   MRN: [id number]  HPI  59 yowf who quit smoking in December 2007, with copd with minimum asthmatic component  baseline = grocery store ok, tends to avoid mall or superstores and does use [facility] parking   Feb 15, 2009 ov c/o sob onset Feb 8th ? goiter > goiter surgery 12/29/08 no better then ended back in the hosp 4/19 - 02/11/2009 better for the last week prior to ov in terms of doe on symbicort and prednsione taper, and dry raspy cough and hoarseness come and go without pattern. Symbicort 2 puffs first thing in am and 2 puffs again in pm about 12 hours later chase the am dose with spiriva  if short of cough or coughing ok to use xopenex or neb every 4 hours  Taper off prednsione as previously instructed.   May 10, 2009- Still on prednisone 20mg  . she has a ceiling of 20mg  and floor of 5 mg.   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 Had been able to participate in rehab and maintain on prednisone 10 onehalf daily and rare need for rescue. Since worse, increased prednsione 10 2 daily x one week but no better and therefore increased to 4 daily 11/28 and now yellow nasal drainage. rec doxy and work on [employer].   July 26, 2011ov/ med review. She remains on prednisone 5mg  once daily flares with taper lower.   July 07, 2010 ov cc SOB and dry cough x 2 days. She states that she got out of breath yesterday while making the bed and when she carried groceries inside on prednisone 5 mg daily rec ceiling of 20 mg per day and work on technique with hfa   October 05, 2010 ov co worse doe despite pred 20 and no longer benefit from saba, no assoc cough or noct/early am symptoms. .rec For stress try Valium 5 mg every 6 hours if needed  See calendar/ get bone density since can't get off prednsione    November 09, 2010 ov sob no better, no cough. rec start  dulera and taper prednisone to a floor of 5 mg and as needed valium   December 21, 2010 ov better , tapered to 10 mg per day. no cough, breathing better with less mouth irritation on dulera. using valium no more than one daily. 1) Try to reduce prednisone to 10 mg one half daily as your floor with option to back to 20 mg per day if worsen    04/30/2011 ov/Landis Dowdy cc sob better on dulera 200,  Able to maintain 19 mg qod.  Using proair avg of 5-6 x per day though, not at hs or early am.  No cough   . Pt denies any significant sore throat, dysphagia, itching, sneezing,  nasal congestion or excess/ purulent secretions,  fever, chills, sweats, unintended wt loss, pleuritic or exertional cp, hempoptysis, orthopnea pnd or leg swelling.    Also denies any obvious fluctuation of symptoms with weather or environmental changes or other aggravating or alleviating factors.       Past Medical History:  COPD  - PFT's 12/07/08 FEV1 1.26(61%) ratio 42, 5% resp to B2  - PFT's 02/03/09 FEV1 .88 (38%) ratio 40  - PFT's 02/08/10 FEV1 1.30 (64%) ratio 45, 19% resp to B2 and DLC046% corrects to 63%  -  PFT's 04/30/2011        1.28(64% ratio 46, no better b2  dlco  50%  - HFA technique 75% Mar 04, 2009 > 75% October 05, 2010> 90% November 09, 2010  - 02 dep at hs since April 2010, no desat walking October 05, 2010  - Prednsone maint April 25, 2009  - Add on Qvar 80 December 28, 2009 > d/c November 09, 2010 as not effective  - Dulera 200 started 11/09/10 >> much better December 21, 2010  Hypertension  Hives 07-2007  Allergic rhinitis  - Sinus ct 01/2009 wnl  Goiter 2009 > resected 12/29/08  Health Maintenance......................................[facility] [place]  - Pneumovax September 15, 2009  - 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, L Fem -1.9, 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 Nl external ear canals without cough reflex  NECK : without JVD/Nodes/TM/ nl carotid upstrokes bilaterally  LUNGS: barrel chest, marked increased t exp  CV: RRR no s3 or murmur or increase in P2, no edema  ABD: soft and nontender with nl excursion in the supine position. No bruits or organomegaly, bowel sounds nl  MS: warm without deformities, calf tenderness, cyanosis or clubbing    Assessment:         Plan:

## 2011-04-30 NOTE — Progress Notes (Signed)
PFT done today. 

## 2011-04-30 NOTE — Patient Instructions (Addendum)
See calendar for specific medication instructions and bring it back for each and every office visit for every healthcare provider you see.  Without it,  you may not receive the best quality medical care that we feel you deserve.   See Tammy NP w/in 3 months with all your medications, even over the counter meds, separated in two separate bags, the ones you take no matter what vs the ones you stop once you feel better and take only as needed when you feel you need them.   Tammy  will generate for you a new user friendly medication calendar that will put [place] all on the same page re: your medication use.     Without this process, it simply isn't possible to assure that we are providing  your outpatient care  with  the attention to detail we feel you deserve.   If we cannot assure that you're getting that kind of care,  then we cannot manage your problem effectively from this clinic.  Once you have seen Tammy and we are sure that we're all on the same page with your medication use she will arrange follow up with me.    Floor for prednisone is now  10 mg pill taken one half every other day  Tammy will also discuss bone density issues with you

## 2011-04-30 NOTE — Assessment & Plan Note (Signed)
The goal with a chronic steroid dependent illness is always arriving at the lowest effective dose that controls the disease/symptoms and not accepting a set "formula" which is based on statistics or guidelines that don't always take into account patient  variability or the natural hx of the dz in every individual patient, which may well vary over time.  For now therefore I recommend the patient maintain  Floor of 10 mg one half daily every other day    Each maintenance medication was reviewed in detail including most importantly the difference between maintenance and as needed and under what circumstances the prns are to be used.  Please see instructions for details which were reviewed in writing and the patient given a copy.

## 2011-05-01 ENCOUNTER — Encounter: Payer: Self-pay | Admitting: Internal Medicine

## 2011-05-07 ENCOUNTER — Other Ambulatory Visit: Payer: Self-pay | Admitting: Internal Medicine

## 2011-05-08 ENCOUNTER — Other Ambulatory Visit: Payer: Self-pay | Admitting: Internal Medicine

## 2011-05-28 ENCOUNTER — Other Ambulatory Visit: Payer: Self-pay | Admitting: Internal Medicine

## 2011-05-31 ENCOUNTER — Other Ambulatory Visit: Payer: Self-pay | Admitting: Internal Medicine

## 2011-06-07 ENCOUNTER — Telehealth: Payer: Self-pay | Admitting: *Deleted

## 2011-06-07 NOTE — Telephone Encounter (Signed)
Pt left VM req saving card for azor.

## 2011-06-14 MED ORDER — AMLODIPINE-OLMESARTAN 5-40 MG PO TABS: 1.0000 | ORAL_TABLET | Freq: Every day | ORAL | Status: DC

## 2011-06-14 NOTE — Telephone Encounter (Signed)
Samples & card avail now, Patient informed

## 2011-07-02 ENCOUNTER — Other Ambulatory Visit: Payer: Self-pay | Admitting: *Deleted

## 2011-07-02 MED ORDER — DIAZEPAM 5 MG PO TABS: 5.0000 mg | ORAL_TABLET | Freq: Four times a day (QID) | ORAL | Status: DC | PRN

## 2011-07-04 ENCOUNTER — Other Ambulatory Visit: Payer: Self-pay | Admitting: Internal Medicine

## 2011-07-06 ENCOUNTER — Other Ambulatory Visit: Payer: Self-pay | Admitting: Internal Medicine

## 2011-08-01 ENCOUNTER — Ambulatory Visit (INDEPENDENT_AMBULATORY_CARE_PROVIDER_SITE_OTHER): Payer: BC Managed Care – PPO | Admitting: Adult Health

## 2011-08-01 ENCOUNTER — Encounter: Payer: Self-pay | Admitting: Adult Health

## 2011-08-01 DIAGNOSIS — J449 Chronic obstructive pulmonary disease, unspecified: Secondary | ICD-10-CM

## 2011-08-01 NOTE — Assessment & Plan Note (Addendum)
Compensated on present regimen. Pt has slowly been tapering down on steroids Has been on chronic oral steroids since 2010. Currently on 5mg  every other day  She doing well on her present regimen. Will try to slowly taper to off over next month.  Patient's medications were reviewed today and patient education was given. Computerized medication calendar was adjusted/completed  Plan;  Follow med calendar closely and bring to each visit.  May try to taper off prednisone over next month, begin to take 5mg  1/2  Every other day for 2 weeks  Then 1/2 twice weekly for 2 weeks and then stop.  follow up Dr. Sherene Sires  In 2 months and As needed

## 2011-08-01 NOTE — Progress Notes (Signed)
Subjective:     Patient ID: [name], female   DOB: 04-Jan-1946, 65 y.o.   MRN: [id number]  HPI  69 yowf who quit smoking in December 2007, with copd with minimum asthmatic component  baseline = grocery store ok, tends to avoid mall or superstores and does use [facility] parking   Feb 15, 2009 ov c/o sob onset Feb 8th ? goiter > goiter surgery 12/29/08 no better then ended back in the hosp 4/19 - 02/11/2009 better for the last week prior to ov in terms of doe on symbicort and prednsione taper, and dry raspy cough and hoarseness come and go without pattern. Symbicort 2 puffs first thing in am and 2 puffs again in pm about 12 hours later chase the am dose with spiriva  if short of cough or coughing ok to use xopenex or neb every 4 hours  Taper off prednsione as previously instructed.   May 10, 2009- Still on prednisone 20mg  . she has a ceiling of 20mg  and floor of 5 mg.   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 Had been able to participate in rehab and maintain on prednisone 10 onehalf daily and rare need for rescue. Since worse, increased prednsione 10 2 daily x one week but no better and therefore increased to 4 daily 11/28 and now yellow nasal drainage. rec doxy and work on [employer].   July 26, 2011ov/ med review. She remains on prednisone 5mg  once daily flares with taper lower.   July 07, 2010 ov cc SOB and dry cough x 2 days. She states that she got out of breath yesterday while making the bed and when she carried groceries inside on prednisone 5 mg daily rec ceiling of 20 mg per day and work on technique with hfa   October 05, 2010 ov co worse doe despite pred 20 and no longer benefit from saba, no assoc cough or noct/early am symptoms. .rec For stress try Valium 5 mg every 6 hours if needed  See calendar/ get bone density since can't get off prednsione    November 09, 2010 ov sob no better, no cough. rec start  dulera and taper prednisone to a floor of 5 mg and as needed valium   December 21, 2010 ov better , tapered to 10 mg per day. no cough, breathing better with less mouth irritation on dulera. using valium no more than one daily. 1) Try to reduce prednisone to 10 mg one half daily as your floor with option to back to 20 mg per day if worsen    04/30/2011 ov/Wert cc sob better on dulera 200,  Able to maintain 10mg  qod.  Using proair avg of 5-6 x per day though, not at hs or early am.  No cough  08/01/2011 Follow up and med review.  [name]. She says she is doing well with no flare in cough or wheezing. Dyspnea is at baseline w/ no increased SABA use. Last OV with no sign. decline in FEV1.  We reviewed all her meds and updated her med calendar with pt education.  She has decreased her prednisone to 5mg   Every other day. Wants to see if she can taper off, feels so better on lower doses and has been able to loose weight on lower steroid doses.  No chest pain, vomitting or edema.  Past Medical History:  COPD  - PFT's 12/07/08 FEV1 1.26(61%) ratio 42, 5% resp to B2  - PFT's 02/03/09 FEV1 .88 (38%) ratio 40  - PFT's 02/08/10 FEV1 1.30 (64%) ratio 45, 19% resp to B2 and DLC046% corrects to 63%  - PFT's 04/30/2011        1.28(64% ratio 46, no better b2  dlco  50%  - HFA technique 75% Mar 04, 2009 > 75% October 05, 2010> 90% November 09, 2010  - 02 dep at hs since April 2010, no desat walking October 05, 2010  - Prednsone maint April 25, 2009  - Add on Qvar 80 December 28, 2009 > d/c November 09, 2010 as not effective  - Dulera 200 started 11/09/10 >> much better December 21, 2010  -trial of slow taper off steroids 08/01/2011 >> Hypertension  Hives 07-2007  Allergic rhinitis  - Sinus ct 01/2009 wnl  Goiter 2009 > resected 12/29/08  Health Maintenance......................................[facility] [place]  - Pneumovax September 15, 2009  - 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, L Fem  -1.9, R -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  HEENT:   No headaches,  Difficulty swallowing,  Tooth/dental problems, or  Sore throat,                No sneezing, itching, ear ache, nasal congestion, post nasal drip,   CV:  No chest pain,  Orthopnea, PND, swelling in lower extremities, anasarca, dizziness, palpitations, syncope.   GI  No heartburn, indigestion, abdominal pain, nausea, vomiting, diarrhea, change in bowel habits, loss of appetite, bloody stools.   Resp:  No coughing up of blood.  No change in color of mucus.  No wheezing.  No chest wall deformity  Skin: no rash or lesions.  GU: no dysuria, change in color of urine, no urgency or frequency.  No flank pain, no hematuria   MS:  No joint pain or swelling.  No decreased range of motion.  No back pain.  Psych:  No change in mood or affect. No depression or anxiety.  No memory loss.         Objective:   Physical Exam  ambulatory wf nad mild  cushingnoid  wt 144 April 25, 2009 > 156 May 09, 2010 > 154 November 09, 2010 > 153 December 21, 2010 > 135 04/30/2011 >>131 08/01/2011  HEENT:edentullous, nl turbinates, Post pharynx w/ minimal white patches. Nl external ear canals without cough reflex  NECK : without JVD/Nodes/TM/ nl carotid upstrokes bilaterally  LUNGS: barrel chest, marked increased t exp  CV: RRR no s3 or murmur or increase in P2, no edema  ABD: soft and nontender with nl excursion in the supine position. No bruits or organomegaly, bowel sounds nl  MS: warm without deformities, calf tenderness, cyanosis or clubbing    Assessment:         Plan:

## 2011-08-01 NOTE — Patient Instructions (Addendum)
Follow med calendar closely and bring to each visit.  May try to taper off prednisone over next month, begin to take 5mg  1/2  Every other day for 2 weeks  Then 1/2 twice weekly for 2 weeks and then stop.  follow up Dr. Sherene Sires  In 2 months and As needed

## 2011-08-14 ENCOUNTER — Telehealth: Payer: Self-pay | Admitting: Internal Medicine

## 2011-08-14 MED ORDER — CLOTRIMAZOLE 10 MG MT TROC: 10.0000 mg | Freq: Four times a day (QID) | OROMUCOSAL | Status: AC

## 2011-08-14 NOTE — Telephone Encounter (Signed)
Clotrimazole troche 10 mg one qid prn #12 and then ov 2 weeks to regroup with meds

## 2011-08-14 NOTE — Telephone Encounter (Signed)
Pt is c/o having white patches in her mouth, and sore throat and is requesting an rx for thrush. Please advise.Carron Curie, CMA

## 2011-08-14 NOTE — Telephone Encounter (Signed)
Spoke with patient-aware that MW approved for rx called to [facility] [address] and has appt scheduled on 08-29-11 at 1045 to see MW with all meds to regroup.

## 2011-08-22 NOTE — Progress Notes (Signed)
Addended by: Abigail Miyamoto D on: 08/22/2011 10:33 AM   Modules accepted: Orders

## 2011-08-29 ENCOUNTER — Ambulatory Visit: Payer: BC Managed Care – PPO | Admitting: Internal Medicine

## 2011-09-21 ENCOUNTER — Ambulatory Visit (INDEPENDENT_AMBULATORY_CARE_PROVIDER_SITE_OTHER): Payer: BC Managed Care – PPO | Admitting: Internal Medicine

## 2011-09-21 ENCOUNTER — Encounter: Payer: Self-pay | Admitting: Internal Medicine

## 2011-09-21 VITALS — BP 60/42 | HR 92 | Temp 99.1°F | Resp 16 | Wt 125.0 lb

## 2011-09-21 DIAGNOSIS — A09 Infectious gastroenteritis and colitis, unspecified: Secondary | ICD-10-CM

## 2011-09-21 DIAGNOSIS — J209 Acute bronchitis, unspecified: Secondary | ICD-10-CM

## 2011-09-21 DIAGNOSIS — I959 Hypotension, unspecified: Secondary | ICD-10-CM | POA: Insufficient documentation

## 2011-09-21 DIAGNOSIS — B370 Candidal stomatitis: Secondary | ICD-10-CM

## 2011-09-21 DIAGNOSIS — J449 Chronic obstructive pulmonary disease, unspecified: Secondary | ICD-10-CM

## 2011-09-21 DIAGNOSIS — J440 Chronic obstructive pulmonary disease with acute lower respiratory infection: Secondary | ICD-10-CM

## 2011-09-21 MED ORDER — TRAMADOL HCL 50 MG PO TABS: 50.0000 mg | ORAL_TABLET | Freq: Two times a day (BID) | ORAL | Status: AC | PRN

## 2011-09-21 MED ORDER — DIPHENOXYLATE-ATROPINE 2.5-0.025 MG PO TABS: 1.0000 | ORAL_TABLET | Freq: Four times a day (QID) | ORAL | Status: DC | PRN

## 2011-09-21 MED ORDER — FLUCONAZOLE 100 MG PO TABS: ORAL_TABLET | ORAL | Status: AC

## 2011-09-21 MED ORDER — PROMETHAZINE HCL 12.5 MG PO TABS: 12.5000 mg | ORAL_TABLET | Freq: Four times a day (QID) | ORAL | Status: AC | PRN

## 2011-09-21 NOTE — Assessment & Plan Note (Signed)
IM depo 120 mg PO rehydr at home

## 2011-09-21 NOTE — Assessment & Plan Note (Signed)
Diflucan

## 2011-09-21 NOTE — Assessment & Plan Note (Signed)
See Meds - viral illness

## 2011-09-21 NOTE — Patient Instructions (Signed)
Hold your BP meds x 5-7 days Gatorade sips Go to ER if ill

## 2011-09-21 NOTE — Progress Notes (Signed)
  Subjective:    Patient ID: [name], female    DOB: Dec 11, 1945, 65 y.o.   MRN: [id number]  HPI   HPI  C/o URI sx's x  3 days. C/o n/v/d and ST, cough, weakness. Not better with OTC medicines. Actually, the patient is getting worse. The patient is very weak  Review of Systems  Constitutional: Positive for fever, chills and fatigue.  HENT: Positive for congestion, rhinorrhea, sneezing and postnasal drip.   Eyes: Positive for photophobia and pain. Negative for discharge and visual disturbance.  Respiratory: Positive for cough and wheezing.   Positive for chest pain.  Gastrointestinal: Negative for vomiting, abdominal pain, diarrhea and abdominal distention.  Genitourinary: Negative for dysuria and difficulty urinating.  Skin: Negative for rash.  Neurological: Positive for dizziness, weakness and light-headedness.      Review of Systems  Gastrointestinal: Positive for nausea, vomiting, abdominal pain and diarrhea.       Objective:   Physical Exam  Constitutional: She appears well-developed and well-nourished. No distress.       weak  HENT:  Head: Normocephalic.  Right Ear: External ear normal.  Left Ear: External ear normal.  Nose: Nose normal.  Mouth/Throat: Oropharynx is clear and moist.  Eyes: Conjunctivae are normal. Pupils are equal, round, and reactive to light. Right eye exhibits no discharge. Left eye exhibits no discharge.  Neck: Normal range of motion. Neck supple. No JVD present. No tracheal deviation present. No thyromegaly present.  Cardiovascular: Normal rate, regular rhythm and normal heart sounds.   Pulmonary/Chest: No stridor. No respiratory distress. She has no wheezes.  Abdominal: Soft. Bowel sounds are normal. She exhibits no distension and no mass. There is tenderness. There is no rebound and no guarding.  Musculoskeletal: She exhibits no edema and no tenderness.  Lymphadenopathy:    She has no cervical adenopathy.  Neurological: She displays  normal reflexes. No cranial nerve deficit. She exhibits normal muscle tone. Coordination abnormal.  Skin: No rash noted. No erythema.  Psychiatric: She has a normal mood and affect. Her behavior is normal. Judgment and thought content normal.          Assessment & Plan:

## 2011-09-24 MED ORDER — METHYLPREDNISOLONE ACETATE PF 80 MG/ML IJ SUSP
120.0000 mg | Freq: Once | INTRAMUSCULAR | Status: AC
  Administered 2011-09-24: 120 mg via INTRAMUSCULAR

## 2011-10-01 ENCOUNTER — Telehealth: Payer: Self-pay | Admitting: Internal Medicine

## 2011-10-01 ENCOUNTER — Other Ambulatory Visit: Payer: Self-pay | Admitting: Internal Medicine

## 2011-10-01 ENCOUNTER — Encounter: Payer: Self-pay | Admitting: Internal Medicine

## 2011-10-01 ENCOUNTER — Ambulatory Visit: Payer: BC Managed Care – PPO

## 2011-10-01 ENCOUNTER — Other Ambulatory Visit (INDEPENDENT_AMBULATORY_CARE_PROVIDER_SITE_OTHER): Payer: BC Managed Care – PPO

## 2011-10-01 ENCOUNTER — Ambulatory Visit (INDEPENDENT_AMBULATORY_CARE_PROVIDER_SITE_OTHER): Payer: BC Managed Care – PPO | Admitting: Internal Medicine

## 2011-10-01 DIAGNOSIS — A09 Infectious gastroenteritis and colitis, unspecified: Secondary | ICD-10-CM

## 2011-10-01 DIAGNOSIS — I959 Hypotension, unspecified: Secondary | ICD-10-CM

## 2011-10-01 DIAGNOSIS — B370 Candidal stomatitis: Secondary | ICD-10-CM

## 2011-10-01 DIAGNOSIS — J449 Chronic obstructive pulmonary disease, unspecified: Secondary | ICD-10-CM

## 2011-10-01 DIAGNOSIS — J9610 Chronic respiratory failure, unspecified whether with hypoxia or hypercapnia: Secondary | ICD-10-CM

## 2011-10-01 DIAGNOSIS — N390 Urinary tract infection, site not specified: Secondary | ICD-10-CM

## 2011-10-01 LAB — HEPATIC FUNCTION PANEL
ALT: 15 U/L (ref 0–35)
Albumin: 3.6 g/dL (ref 3.5–5.2)
Total Protein: 6.7 g/dL (ref 6.0–8.3)

## 2011-10-01 LAB — URINALYSIS, ROUTINE W REFLEX MICROSCOPIC
Hgb urine dipstick: NEGATIVE
Nitrite: NEGATIVE
Specific Gravity, Urine: 1.02 (ref 1.000–1.030)
Urobilinogen, UA: 0.2 (ref 0.0–1.0)

## 2011-10-01 LAB — CBC WITH DIFFERENTIAL/PLATELET
Basophils Absolute: 0 10*3/uL (ref 0.0–0.1)
Basophils Relative: 0 % (ref 0.0–3.0)
HCT: 37.8 % (ref 36.0–46.0)
Hemoglobin: 13 g/dL (ref 12.0–15.0)
Lymphocytes Relative: 2.1 % — ABNORMAL LOW (ref 12.0–46.0)
Lymphs Abs: 0.5 10*3/uL — ABNORMAL LOW (ref 0.7–4.0)
MCHC: 34.3 g/dL (ref 30.0–36.0)
Monocytes Relative: 4.2 % (ref 3.0–12.0)
Neutro Abs: 21.1 10*3/uL — ABNORMAL HIGH (ref 1.4–7.7)
RBC: 3.82 Mil/uL — ABNORMAL LOW (ref 3.87–5.11)
RDW: 13.5 % (ref 11.5–14.6)

## 2011-10-01 LAB — TSH: TSH: 0.42 u[IU]/mL (ref 0.35–5.50)

## 2011-10-01 LAB — BASIC METABOLIC PANEL
BUN: 32 mg/dL — ABNORMAL HIGH (ref 6–23)
CO2: 20 mEq/L (ref 19–32)
Calcium: 9.7 mg/dL (ref 8.4–10.5)
Chloride: 108 mEq/L (ref 96–112)
Creatinine, Ser: 1.3 mg/dL — ABNORMAL HIGH (ref 0.4–1.2)

## 2011-10-01 NOTE — Assessment & Plan Note (Signed)
Adequate control on present rx, reviewed  

## 2011-10-01 NOTE — Progress Notes (Signed)
Subjective:     Patient ID: [name], female   DOB: [birth year], 65 y.o.   MRN: [id number]  HPI  27 yowf who quit smoking in December 2007, with copd with minimum asthmatic component  baseline = grocery store ok, tends to avoid mall or superstores and does use [facility] parking/ steroid dep since July 2010   July 26, 2011ov/ med review. She remains on prednisone 5mg  once daily flares with taper lower.   July 07, 2010 ov cc SOB and dry cough x 2 days. She states that she got out of breath yesterday while making the bed and when she carried groceries inside on prednisone 5 mg daily rec ceiling of 20 mg per day and work on technique with hfa   October 05, 2010 ov co worse doe despite pred 20 and no longer benefit from saba, no assoc cough or noct/early am symptoms. .rec For stress try Valium 5 mg every 6 hours if needed  See calendar/ get bone density since can't get off prednsione.  November 09, 2010 ov sob no better, no cough. rec start dulera and taper prednisone to a floor of 5 mg and as needed valium   December 21, 2010 ov better , tapered to 10 mg per day. no cough, breathing better with less mouth irritation on dulera. using valium no more than one daily. 1) Try to reduce prednisone to 10 mg one half daily as your floor with option to back to 20 mg per day if worsen   08/01/2011 Follow up and med review/NP.  [name]. She says she is doing well with no flare in cough or wheezing. Dyspnea is at baseline w/ no increased SABA use. Last OV with no sign. decline in FEV1.  We reviewed all her meds and updated her med calendar with pt education.  She has decreased her prednisone to 5mg   Every other day. Wants to see if she can taper off, feels so better on lower doses and has been able to loose weight on lower steroid doses rec Follow med calendar closely and bring to each visit.  May try to taper off prednisone over next month, begin to take 5mg  1/2  Every other  day for 2 weeks  Then 1/2 twice weekly for 2 weeks and then stop.   10/01/2011 f/u ov/Wert much worse off prednsione so now back  on prednisone 5 mg two each am breathing better and no need for saba. Generalized weakness/ anorexia also while off prednisone. No purulent sputum.  No doe over baseline. No purulent sputum or increased cough.  Sleeping ok without nocturnal  or early am exacerbation  of respiratory  c/o's or need for noct saba. Also denies any obvious fluctuation of symptoms with weather or environmental changes or other aggravating or alleviating factors except as outlined above   ROS  At present neg for  any significant sore throat, dysphagia, itching, sneezing,  nasal congestion or excess/ purulent secretions,  fever, chills, sweats, unintended wt loss, pleuritic or exertional cp, hempoptysis, orthopnea pnd or leg swelling.  Also denies presyncope, palpitations, heartburn, abdominal pain, nausea, vomiting, diarrhea  or change in bowel or urinary habits, dysuria,hematuria,  rash, arthralgias, visual complaints, headache, numbness weakness or ataxia.       Past Medical History:  COPD  - PFT's 12/07/08 FEV1 1.26(61%) ratio 42, 5% resp to B2  - PFT's 02/03/09 FEV1 .88 (38%) ratio 40  - PFT's 02/08/10 FEV1 1.30 (64%) ratio  45, 19% resp to B2 and DLC046% corrects to 63%  - PFT's 04/30/2011        1.28(64% ratio 46, no better b2  dlco  50%  - HFA technique 75% Mar 04, 2009 > 75% October 05, 2010> 90% November 09, 2010  - 02 dep at hs since April 2010, no desat walking October 05, 2010  - Prednsone maint April 25, 2009  - Add on Qvar 80 December 28, 2009 > d/c November 09, 2010 as not effective  - Dulera 200 started 11/09/10 >> much better December 21, 2010  Hypertension  Hives 07-2007  Allergic rhinitis  - Sinus ct 01/2009 wnl  Goiter 2009 > resected 12/29/08  Health Maintenance......................................[facility] [place]  - Pneumovax September 15, 2009  - DEXA 10/30/2009 T spine  -1.7, L Fem -1.9, R -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 Nl external ear canals without cough reflex  NECK : without JVD/Nodes/TM/ nl carotid upstrokes bilaterally  LUNGS: barrel chest, marked increased t exp  CV: RRR no s3 or murmur or increase in P2, no edema  ABD: soft and nontender with nl excursion in the supine position. No bruits or organomegaly, bowel sounds nl  MS: warm without deformities, calf tenderness, cyanosis or clubbing    Assessment:         Plan:

## 2011-10-01 NOTE — Patient Instructions (Addendum)
See calendar for specific medication instructions and bring it back for each and every office visit for every healthcare provider you see.  Without it,  you may not receive the best quality medical care that we feel you deserve.  You will note that the calendar groups together  your maintenance  medications that are timed at particular times of the day.  Think of this as your checklist for what your doctor has instructed you to do until your next evaluation to see what benefit  there is  to staying on a consistent group of medications intended to keep you well.  The other group at the bottom is entirely up to you to use as you see fit  for specific symptoms that may arise between visits that require you to treat them on an as needed basis.  Think of this as your action plan or "what if" list.   Separating the top medications from the bottom group is fundamental to providing you adequate care going forward.   Prednisone 5 mg new ceiling is 2 daily until better then maintain on 1 daily until return   Please schedule a follow up office visit in 6 weeks, call sooner if needed

## 2011-10-01 NOTE — Assessment & Plan Note (Addendum)
I had an extended discussion with the patient today lasting 15 to 20 minutes of a 25 minute visit on the following issues:   The goal with a chronic steroid dependent illness is always arriving at the lowest effective dose that controls the disease/symptoms and not accepting a set "formula" which is based on statistics or guidelines that don't always take into account patient  variability or the natural hx of the dz in every individual patient, which may well vary over time.  Clearly she worsened after recent atttempts to minimize the prednisone despite compliance with duler >  For now therefore I recommend the patient maintain a ceiling of 20 mg per day and a floor of 10 mg until regroup    Each maintenance medication was reviewed in detail including most importantly the difference between maintenance and as needed and under what circumstances the prns are to be used.  This was done in the context of a medication calendar review which provided the patient with a user-friendly unambiguous mechanism for medication administration and reconciliation and provides an action plan for all active problems. It is critical that this be shown to every doctor  for modification during the office visit if necessary so the patient can use it as a working document.

## 2011-10-01 NOTE — Telephone Encounter (Signed)
Urine cx added.

## 2011-10-01 NOTE — Telephone Encounter (Signed)
[name], please, get a urine cx thx

## 2011-10-03 ENCOUNTER — Other Ambulatory Visit: Payer: Self-pay | Admitting: Internal Medicine

## 2011-10-03 LAB — URINE CULTURE

## 2011-10-03 MED ORDER — DIAZEPAM 5 MG PO TABS: 5.0000 mg | ORAL_TABLET | Freq: Four times a day (QID) | ORAL | Status: DC | PRN

## 2011-10-05 ENCOUNTER — Encounter: Payer: Self-pay | Admitting: Internal Medicine

## 2011-10-05 ENCOUNTER — Ambulatory Visit (INDEPENDENT_AMBULATORY_CARE_PROVIDER_SITE_OTHER): Payer: BC Managed Care – PPO | Admitting: Internal Medicine

## 2011-10-05 VITALS — BP 90/62 | HR 84 | Temp 98.7°F | Resp 16 | Wt 116.0 lb

## 2011-10-05 DIAGNOSIS — T50904A Poisoning by unspecified drugs, medicaments and biological substances, undetermined, initial encounter: Secondary | ICD-10-CM

## 2011-10-05 DIAGNOSIS — B370 Candidal stomatitis: Secondary | ICD-10-CM

## 2011-10-05 DIAGNOSIS — G251 Drug-induced tremor: Secondary | ICD-10-CM | POA: Insufficient documentation

## 2011-10-05 DIAGNOSIS — I959 Hypotension, unspecified: Secondary | ICD-10-CM

## 2011-10-05 DIAGNOSIS — G250 Essential tremor: Secondary | ICD-10-CM

## 2011-10-05 DIAGNOSIS — J449 Chronic obstructive pulmonary disease, unspecified: Secondary | ICD-10-CM

## 2011-10-05 DIAGNOSIS — A09 Infectious gastroenteritis and colitis, unspecified: Secondary | ICD-10-CM

## 2011-10-05 MED ORDER — LEVALBUTEROL TARTRATE 45 MCG/ACT IN AERO: 1.0000 | INHALATION_SPRAY | RESPIRATORY_TRACT | Status: DC | PRN

## 2011-10-05 NOTE — Assessment & Plan Note (Signed)
Try Xopenex via [facility]N or [facility] in place of Albuterol prn

## 2011-10-05 NOTE — Patient Instructions (Addendum)
Take Azor 1/2 talblet a day Keep BP>120/70 You can try Pressair 1 inh twice a day in place of spiriva if you like it

## 2011-10-05 NOTE — Assessment & Plan Note (Signed)
Resolved w/Diflucan

## 2011-10-05 NOTE — Assessment & Plan Note (Signed)
Better - reduce BP med dose

## 2011-10-05 NOTE — Assessment & Plan Note (Signed)
Resolved

## 2011-10-05 NOTE — Assessment & Plan Note (Addendum)
Continue with current prescription therapy as reflected on the Med list. She can try Pressair BID in place of spiriva if she likes it.Marland KitchenMarland Kitchen

## 2011-10-05 NOTE — Progress Notes (Signed)
  Subjective:    Patient ID: [name], female    DOB: January 03, 1946, 65 y.o.   MRN: [id number]  HPI The patient presents for a follow-up of  chronic hypertension w/low BP lately, tremor, COPD, fatigue. C/o weakness, SOB. No diarrhea now.Marland KitchenMarland KitchenC/o Spiriva is hard to use....    Review of Systems  Constitutional: Positive for fatigue. Negative for chills, activity change, appetite change and unexpected weight change.  HENT: Negative for congestion, mouth sores and sinus pressure.   Eyes: Negative for visual disturbance.  Respiratory: Positive for cough, shortness of breath and wheezing. Negative for chest tightness.   Gastrointestinal: Negative for nausea and abdominal pain.  Genitourinary: Negative for frequency, difficulty urinating and vaginal pain.  Musculoskeletal: Negative for back pain and gait problem.  Skin: Negative for pallor and rash.  Neurological: Positive for tremors. Negative for dizziness, weakness, numbness and headaches.  Psychiatric/Behavioral: Negative for confusion and sleep disturbance. The patient is nervous/anxious.        Objective:   Physical Exam  Constitutional: She appears well-developed. No distress.  HENT:  Head: Normocephalic.  Right Ear: External ear normal.  Left Ear: External ear normal.  Nose: Nose normal.  Mouth/Throat: Oropharynx is clear and moist.       Hoarse, no thrush  Eyes: Conjunctivae are normal. Pupils are equal, round, and reactive to light. Right eye exhibits no discharge. Left eye exhibits no discharge.  Neck: Normal range of motion. Neck supple. No JVD present. No tracheal deviation present. No thyromegaly present.  Cardiovascular: Normal rate, regular rhythm and normal heart sounds.   Pulmonary/Chest: Effort normal. No stridor. No respiratory distress. She has no wheezes. She has no rales. She exhibits no tenderness.  Abdominal: Soft. Bowel sounds are normal. She exhibits no distension and no mass. There is no tenderness. There is  no rebound and no guarding.  Musculoskeletal: She exhibits no edema and no tenderness.  Lymphadenopathy:    She has no cervical adenopathy.  Neurological: She displays normal reflexes. No cranial nerve deficit. She exhibits normal muscle tone. Coordination normal.       Mild tremor  Skin: No rash noted. No erythema.  Psychiatric: She has a normal mood and affect. Her behavior is normal. Judgment and thought content normal.   Labs from 12/17 reviewed  I personally provided the Pressair inhaler use teaching. After the teaching patient was able to demonstrate it's use effectively. All questions were answered        Assessment & Plan:

## 2011-10-08 ENCOUNTER — Other Ambulatory Visit: Payer: Self-pay | Admitting: Allergy

## 2011-11-05 ENCOUNTER — Other Ambulatory Visit: Payer: Self-pay | Admitting: Internal Medicine

## 2011-11-19 ENCOUNTER — Ambulatory Visit (INDEPENDENT_AMBULATORY_CARE_PROVIDER_SITE_OTHER): Payer: BC Managed Care – PPO | Admitting: Internal Medicine

## 2011-11-19 ENCOUNTER — Encounter: Payer: Self-pay | Admitting: Internal Medicine

## 2011-11-19 DIAGNOSIS — J449 Chronic obstructive pulmonary disease, unspecified: Secondary | ICD-10-CM

## 2011-11-19 DIAGNOSIS — I10 Essential (primary) hypertension: Secondary | ICD-10-CM

## 2011-11-19 MED ORDER — AMLODIPINE-OLMESARTAN 5-40 MG PO TABS: 1.0000 | ORAL_TABLET | Freq: Every day | ORAL | Status: DC

## 2011-11-19 NOTE — Patient Instructions (Signed)
Increase Azor back to to one daily  Ok to taper prednisone 10 mg tablets as low as one half daily if breathing is good with a ceiling of 20 mg (two tablets daily) if breathing worsens as per calendar  Only use your albuterol as a rescue medication( Plan B = Proaire,  Plan C= nebulizer)  to be used if you can't catch your breath by resting or doing a relaxed purse lip breathing pattern. The less you use it, the better it will work when you need it.   See calendar for specific medication instructions and bring it back for each and every office visit for every healthcare provider you see.  Without it,  you may not receive the best quality medical care that we feel you deserve.  You will note that the calendar groups together  your maintenance  medications that are timed at particular times of the day.  Think of this as your checklist for what your doctor has instructed you to do until your next evaluation to see what benefit  there is  to staying on a consistent group of medications intended to keep you well.  The other group at the bottom is entirely up to you to use as you see fit  for specific symptoms that may arise between visits that require you to treat them on an as needed basis.  Think of this as your action plan or "what if" list.   Separating the top medications from the bottom group is fundamental to providing you adequate care going forward.    Please schedule a follow up visit in 3 months but call sooner if needed

## 2011-11-19 NOTE — Assessment & Plan Note (Signed)
The goal with a chronic steroid dependent illness is always arriving at the lowest effective dose that controls the disease/symptoms and not accepting a set "formula" which is based on statistics or guidelines that don't always take into account patient  variability or the natural hx of the dz in every individual patient, which may well vary over time.  For now therefore I recommend the patient maintain  A ceiling of 20 mg per day and a floor of 5 mg per day    Each maintenance medication was reviewed in detail including most importantly the difference between maintenance and as needed and under what circumstances the prns are to be used. This was done in the context of a medication calendar review which provided the patient with a user-friendly unambiguous mechanism for medication administration and reconciliation and provides an action plan for all active problems. It is critical that this be shown to every doctor  for modification during the office visit if necessary so the patient can use it as a working document.

## 2011-11-19 NOTE — Progress Notes (Signed)
Subjective:     Patient ID: [name], female   DOB: 05/12/1946    MRN: [id number]  HPI  9 yowf who quit smoking in December 2007, with copd with minimum asthmatic component  baseline = grocery store ok, tends to avoid mall or superstores and does use [facility] parking/ steroid dep since July 2010   October 05, 2010 ov co worse doe despite pred 20 and no longer benefit from saba, no assoc cough or noct/early am symptoms. .rec For stress try Valium 5 mg every 6 hours if needed  See calendar/ get bone density since can't get off prednsione.  November 09, 2010 ov sob no better, no cough.  rec start dulera and taper prednisone to a floor of 5 mg and as needed valium   December 21, 2010 ov better , tapered to 10 mg per day. no cough, breathing better with less mouth irritation on dulera. using valium no more than one daily.  rec 1) Try to reduce prednisone to 10 mg one half daily as your floor with option to back to 20 mg per day if worsen   08/01/2011 Follow up and med review/NP.  [name]. She says she is doing well with no flare in cough or wheezing. Dyspnea is at baseline w/ no increased SABA use. Last OV with no sign. decline in FEV1.  We reviewed all her meds and updated her med calendar with pt education.  She has decreased her prednisone to 5mg   Every other day. Wants to see if she can taper off, feels so better on lower doses and has been able to loose weight on lower steroid doses rec Follow med calendar closely and bring to each visit.  May try to taper off prednisone over next month, begin to take 5mg  1/2  Every other day for 2 weeks  Then 1/2 twice weekly for 2 weeks and then stop.   10/01/2011 f/u ov/Wert much worse off prednsione so now back  on prednisone 5 mg two each am breathing better and no need for saba. Generalized weakness/ anorexia also while off prednisone.   Imp steroid dep/ adrenal insuff. rec Prednisone 5 mg new ceiling is 2 daily until  better then maintain on 1 daily until return    11/19/2011 f/u ov/Wert cc Shortness of Breath Pt states breathing doing well- tapered herself to 5 mg prednisone daily -  denies cough at present time.  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 Also denies any obvious fluctuation of symptoms with weather or environmental changes or other aggravating or alleviating factors except as outlined above   ROS  At present neg for  any significant sore throat, dysphagia, itching, sneezing,  nasal congestion or excess/ purulent secretions,  fever, chills, sweats, unintended wt loss, pleuritic or exertional cp, hempoptysis, orthopnea pnd or leg swelling.  Also denies presyncope, palpitations, heartburn, abdominal pain, nausea, vomiting, diarrhea  or change in bowel or urinary habits, dysuria,hematuria,  rash, arthralgias, visual complaints, headache, numbness weakness or ataxia.       Past Medical History:  COPD  - PFT's 12/07/08 FEV1 1.26(61%) ratio 42, 5% resp to B2  - PFT's 02/03/09 FEV1 .88 (38%) ratio 40  - PFT's 02/08/10 FEV1 1.30 (64%) ratio 45, 19% resp to B2 and DLC046% corrects to 63%  -  PFT's 04/30/2011        1.28(64% ratio 46, no better b2  dlco  50%  - HFA technique 75% Mar 04, 2009 > 75% October 05, 2010> 90% November 09, 2010  - 02 dep at hs since April 2010, no desat walking October 05, 2010  - Prednsone maint April 25, 2009  - Add on Qvar 80 December 28, 2009 > d/c November 09, 2010 as not effective  - Dulera 200 started 11/09/10 >> much better December 21, 2010  Hypertension  Hives 07-2007  Allergic rhinitis  - Sinus ct 01/2009 wnl  Goiter 2009 > resected 12/29/08  Health Maintenance......................................[facility] [place]  - Pneumovax September 15, 2009  - 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 Nl external ear canals without cough reflex  NECK : without JVD/Nodes/TM/ nl carotid upstrokes bilaterally  LUNGS: barrel chest, marked increased t exp  But no wheeze CV: RRR no s3 or murmur or increase in P2, no edema  ABD: soft and nontender with nl excursion in the supine position. No bruits or organomegaly, bowel sounds nl  MS: warm without deformities, calf tenderness, cyanosis or clubbing    Assessment:         Plan:

## 2011-11-19 NOTE — Assessment & Plan Note (Signed)
Not Adequate control on present rx, reviewed options, for now increase azor back to one pill daily

## 2011-12-07 ENCOUNTER — Ambulatory Visit: Payer: BC Managed Care – PPO | Admitting: Internal Medicine

## 2011-12-09 ENCOUNTER — Other Ambulatory Visit: Payer: Self-pay | Admitting: Internal Medicine

## 2011-12-10 ENCOUNTER — Ambulatory Visit (INDEPENDENT_AMBULATORY_CARE_PROVIDER_SITE_OTHER): Payer: BC Managed Care – PPO | Admitting: Internal Medicine

## 2011-12-10 ENCOUNTER — Encounter: Payer: Self-pay | Admitting: Internal Medicine

## 2011-12-10 DIAGNOSIS — J449 Chronic obstructive pulmonary disease, unspecified: Secondary | ICD-10-CM

## 2011-12-10 DIAGNOSIS — I10 Essential (primary) hypertension: Secondary | ICD-10-CM

## 2011-12-10 DIAGNOSIS — J4489 Other specified chronic obstructive pulmonary disease: Secondary | ICD-10-CM

## 2011-12-10 DIAGNOSIS — I959 Hypotension, unspecified: Secondary | ICD-10-CM

## 2011-12-10 DIAGNOSIS — J209 Acute bronchitis, unspecified: Secondary | ICD-10-CM

## 2011-12-10 MED ORDER — METHYLPREDNISOLONE ACETATE PF 80 MG/ML IJ SUSP
120.0000 mg | Freq: Once | INTRAMUSCULAR | Status: AC
  Administered 2011-12-10: 120 mg via INTRAMUSCULAR

## 2011-12-10 MED ORDER — AZITHROMYCIN 250 MG PO TABS: ORAL_TABLET | ORAL | Status: DC

## 2011-12-10 NOTE — Assessment & Plan Note (Signed)
Nl BP now 

## 2011-12-10 NOTE — Assessment & Plan Note (Signed)
Continue with current prescription therapy as reflected on the Med list.  

## 2011-12-10 NOTE — Assessment & Plan Note (Signed)
Zpac 

## 2011-12-10 NOTE — Progress Notes (Signed)
Patient ID: [name], female   DOB: [birth year], 66 y.o.   MRN: [id number]  Subjective:    Patient ID: [name], female    DOB: 07/02/1946, 66 y.o.   MRN: [id number]  Cough Associated symptoms include shortness of breath and wheezing. Pertinent negatives include no chills, headaches or rash.   C/o URI sx's x 3 d  The patient presents for a follow-up of  chronic hypertension w/low BP lately, tremor, COPD, fatigue. C/o weakness, SOB. No diarrhea now.Marland KitchenMarland KitchenC/o Spiriva is hard to use....  Wt Readings from Last 3 Encounters:  12/10/11 125 lb (56.7 kg)  11/19/11 124 lb 3.2 oz (56.337 kg)  10/05/11 116 lb (52.617 kg)    BP Readings from Last 3 Encounters:  12/10/11 122/78  11/19/11 172/76  10/05/11 90/62      Review of Systems  Constitutional: Positive for fatigue. Negative for chills, activity change, appetite change and unexpected weight change.  HENT: Negative for congestion, mouth sores and sinus pressure.   Eyes: Negative for visual disturbance.  Respiratory: Positive for cough, shortness of breath and wheezing. Negative for chest tightness.   Gastrointestinal: Negative for nausea and abdominal pain.  Genitourinary: Negative for frequency, difficulty urinating and vaginal pain.  Musculoskeletal: Negative for back pain and gait problem.  Skin: Negative for pallor and rash.  Neurological: Positive for tremors. Negative for dizziness, weakness, numbness and headaches.  Psychiatric/Behavioral: Negative for confusion and sleep disturbance. The patient is nervous/anxious.        Objective:   Physical Exam  Constitutional: She appears well-developed. No distress.  HENT:  Head: Normocephalic.  Right Ear: External ear normal.  Left Ear: External ear normal.  Nose: Nose normal.  Mouth/Throat: Oropharynx is clear and moist.       Hoarse, no thrush  Eyes: Conjunctivae are normal. Pupils are equal, round, and reactive to light. Right eye exhibits no discharge. Left eye  exhibits no discharge.  Neck: Normal range of motion. Neck supple. No JVD present. No tracheal deviation present. No thyromegaly present.  Cardiovascular: Normal rate, regular rhythm and normal heart sounds.   Pulmonary/Chest: Effort normal. No stridor. No respiratory distress. She has no wheezes. She has no rales. She exhibits no tenderness.  Abdominal: Soft. Bowel sounds are normal. She exhibits no distension and no mass. There is no tenderness. There is no rebound and no guarding.  Musculoskeletal: She exhibits no edema and no tenderness.  Lymphadenopathy:    She has no cervical adenopathy.  Neurological: She displays normal reflexes. No cranial nerve deficit. She exhibits normal muscle tone. Coordination normal.       Mild tremor  Skin: No rash noted. No erythema.  Psychiatric: She has a normal mood and affect. Her behavior is normal. Judgment and thought content normal.          Assessment & Plan:

## 2011-12-10 NOTE — Patient Instructions (Signed)
To work 12/14/11 if ok

## 2011-12-10 NOTE — Assessment & Plan Note (Signed)
Exacerbations: 2/12, 2/13 Depo-medrol 120 mg im Zpac Cont Rx

## 2011-12-13 ENCOUNTER — Telehealth: Payer: Self-pay | Admitting: Internal Medicine

## 2011-12-13 NOTE — Telephone Encounter (Signed)
The pt called hoping to get an rx for cough medicine with codeine [facility] to [facility] on [address].    Thanks!

## 2011-12-14 ENCOUNTER — Telehealth: Payer: Self-pay | Admitting: *Deleted

## 2011-12-14 MED ORDER — PROMETHAZINE-CODEINE 6.25-10 MG/5ML PO SYRP: 5.0000 mL | ORAL_SOLUTION | ORAL | Status: AC | PRN

## 2011-12-14 NOTE — Telephone Encounter (Signed)
Pls call in Prom-cod Thx

## 2011-12-14 NOTE — Telephone Encounter (Signed)
errror

## 2012-01-01 ENCOUNTER — Telehealth: Payer: Self-pay | Admitting: Internal Medicine

## 2012-01-01 MED ORDER — DIAZEPAM 5 MG PO TABS: 5.0000 mg | ORAL_TABLET | Freq: Four times a day (QID) | ORAL | Status: DC | PRN

## 2012-01-01 NOTE — Telephone Encounter (Signed)
Ok to refill 

## 2012-01-01 NOTE — Telephone Encounter (Signed)
I have called rx into the pharmacy.

## 2012-01-01 NOTE — Telephone Encounter (Signed)
[facility] [address] Diazepam 5mg   Take 1 tablet every 6 hours as needed  #120 No Known Allergies Last fil 10/03/2011 Dr Sherene Sires do want to refill this? Thank you

## 2012-01-06 ENCOUNTER — Other Ambulatory Visit: Payer: Self-pay | Admitting: Internal Medicine

## 2012-02-28 ENCOUNTER — Other Ambulatory Visit: Payer: Self-pay | Admitting: Internal Medicine

## 2012-02-29 ENCOUNTER — Other Ambulatory Visit: Payer: Self-pay | Admitting: Internal Medicine

## 2012-03-04 ENCOUNTER — Other Ambulatory Visit: Payer: Self-pay | Admitting: Internal Medicine

## 2012-03-07 ENCOUNTER — Ambulatory Visit (INDEPENDENT_AMBULATORY_CARE_PROVIDER_SITE_OTHER): Payer: BC Managed Care – PPO | Admitting: Internal Medicine

## 2012-03-07 ENCOUNTER — Encounter: Payer: Self-pay | Admitting: Internal Medicine

## 2012-03-07 VITALS — BP 158/90 | HR 101 | Temp 98.3°F | Ht 61.5 in | Wt 131.8 lb

## 2012-03-07 DIAGNOSIS — J449 Chronic obstructive pulmonary disease, unspecified: Secondary | ICD-10-CM

## 2012-03-07 DIAGNOSIS — J4489 Other specified chronic obstructive pulmonary disease: Secondary | ICD-10-CM

## 2012-03-07 DIAGNOSIS — J9610 Chronic respiratory failure, unspecified whether with hypoxia or hypercapnia: Secondary | ICD-10-CM

## 2012-03-07 NOTE — Patient Instructions (Addendum)
See calendar for specific medication instructions and bring it back for each and every office visit for every healthcare provider you see.  Without it,  you may not receive the best quality medical care that we feel you deserve.  You will note that the calendar groups together  your maintenance  medications that are timed at particular times of the day.  Think of this as your checklist for what your doctor has instructed you to do until your next evaluation to see what benefit  there is  to staying on a consistent group of medications intended to keep you well.  The other group at the bottom is entirely up to you to use as you see fit  for specific symptoms that may arise between visits that require you to treat them on an as needed basis.  Think of this as your action plan or "what if" list.   Separating the top medications from the bottom group is fundamental to providing you adequate care going forward.    Please schedule a follow up visit in 4 months but call sooner if needed  

## 2012-03-07 NOTE — Progress Notes (Signed)
Subjective:     Patient ID: [name], female   DOB: 1946/08/14    MRN: [id number]  HPI  35 yowf who quit smoking in December 2007, with copd with minimum asthmatic component  baseline = grocery store ok, tends to avoid mall or superstores and does use [facility] parking/ steroid dep since July 2010   October 05, 2010 ov co worse doe despite pred 20 and no longer benefit from saba, no assoc cough or noct/early am symptoms. .rec For stress try Valium 5 mg every 6 hours if needed  See calendar/ get bone density since can't get off prednsione.  November 09, 2010 ov sob no better, no cough.  rec start dulera and taper prednisone to a floor of 5 mg and as needed valium   December 21, 2010 ov better , tapered to 10 mg per day. no cough, breathing better with less mouth irritation on dulera. using valium no more than one daily.  rec 1) Try to reduce prednisone to 10 mg one half daily as your floor with option to back to 20 mg per day if worsen   08/01/2011 Follow up and med review/NP.  [name]. She says she is doing well with no flare in cough or wheezing. Dyspnea is at baseline w/ no increased SABA use. Last OV with no sign. decline in FEV1.  We reviewed all her meds and updated her med calendar with pt education.  She has decreased her prednisone to 5mg   Every other day. Wants to see if she can taper off, feels so better on lower doses and has been able to loose weight on lower steroid doses rec Follow med calendar closely and bring to each visit.  May try to taper off prednisone over next month, begin to take 5mg  1/2  Every other day for 2 weeks  Then 1/2 twice weekly for 2 weeks and then stop.   10/01/2011 f/u ov/[name] much worse off prednsione so now back  on prednisone 5 mg two each am breathing better and no need for saba. Generalized weakness/ anorexia also while off prednisone.   Imp steroid dep/ adrenal insuff. rec Prednisone 5 mg new ceiling is 2 daily until  better then maintain on 1 daily until return    11/19/2011 f/u ov/[name] cc Shortness of Breath Pt states breathing doing well- tapered herself to 5 mg prednisone daily -  denies cough at present time.  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  See calendar for specific medication instruction.  03/07/2012 f/u ov/[name] cc no change sob, min dry cough, increased prednisone to 5mg  per day with pollen but did fine on 5 mg until then and no change in activity tolerance and sleep fine.  No daytime rescue therapy with any form of saba.  Sleeping ok without nocturnal  or early am exacerbation  of respiratory  c/o's or need for noct saba. Also denies any obvious fluctuation of symptoms with weather or environmental changes or other aggravating or alleviating factors except as outlined above   ROS  At present neg for  any significant  sore throat, dysphagia, itching, sneezing,  nasal congestion or excess/ purulent secretions,  fever, chills, sweats, unintended wt loss, pleuritic or exertional cp, hempoptysis, orthopnea pnd or leg swelling.  Also denies presyncope, palpitations, heartburn, abdominal pain, nausea, vomiting, diarrhea  or change in bowel or urinary habits, dysuria,hematuria,  rash, arthralgias, visual complaints, headache, numbness weakness or ataxia.       Past Medical History:  COPD  - PFT's 12/07/08 FEV1 1.26(61%) ratio 42, 5% resp to B2  - PFT's 02/03/09 FEV1 .88 (38%) ratio 40  - PFT's 02/08/10 FEV1 1.30 (64%) ratio 45, 19% resp to B2 and DLC046% corrects to 63%  - PFT's 04/30/2011        1.28(64%) ratio 46, no better b2  dlco  50%  - HFA technique 75% Mar 04, 2009 > 75% October 05, 2010> 90% November 09, 2010  - 02 dep at hs since April 2010, no desat walking October 05, 2010  - Prednsone maint April 25, 2009  - Add on Qvar 80 December 28, 2009 > d/c November 09, 2010 as not effective  - Dulera 200 started 11/09/10 >> much better December 21, 2010  Hypertension  Hives 07-2007  Allergic rhinitis  - Sinus ct 01/2009 wnl  Goiter 2009 > resected 12/29/08  Health Maintenance......................................[facility]land [employer] [place]  - Pneumovax September 15, 2009  - 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, 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 Nl external ear canals without cough reflex  NECK : without JVD/Nodes/TM/ nl carotid upstrokes bilaterally  LUNGS: barrel chest, marked increased t exp  But no wheeze CV: RRR no s3 or murmur or increase in P2, no edema  ABD: soft and nontender with nl excursion in the supine position. No bruits or organomegaly, bowel sounds nl  MS: warm without deformities, calf tenderness, cyanosis or clubbing    Assessment:         Plan:

## 2012-03-30 NOTE — Assessment & Plan Note (Signed)
-   02 at hs only at 1.5.pm   Adequate control on present rx, reviewed

## 2012-03-30 NOTE — Assessment & Plan Note (Addendum)
-   PFT's 12/07/08 FEV1 1.26(61%) ratio 42, 5% resp to B2  - PFT's 02/03/09 FEV1 .88 (38%) ratio 40  - PFT's 02/08/10 FEV1 1.30 (64%) ratio 45, 19% resp to B2 and DLC046% corrects to 63%  - PFT's 04/30/2011        1.28(64% ratio 46, no better b2  dlco  50%  - HFA technique 75% Mar 04, 2009 > 75% October 05, 2010> 90% November 09, 2010  - 02 dep at hs since April 2010, no desat walking October 05, 2010  - Prednsone maint since  April 25, 2009  - Add on Qvar 80 December 28, 2009 > d/c November 09, 2010 as not effective  - Dulera 200 started 11/09/10 >> much better December 21, 2010    Exacerbations: 2/12, 2/13  GOLD III and freq exac so high risk  I had an extended discussion with the patient today lasting 15 to 20 minutes of a 25 minute visit on the following issues:     Each maintenance medication was reviewed in detail including most importantly the difference between maintenance and as needed and under what circumstances the prns are to be used. This was done in the context of a medication calendar review which provided the patient with a user-friendly unambiguous mechanism for medication administration and reconciliation and provides an action plan for all active problems. It is critical that this be shown to every doctor  for modification during the office visit if necessary so the patient can use it as a working document.

## 2012-04-10 ENCOUNTER — Ambulatory Visit: Payer: BC Managed Care – PPO | Admitting: Internal Medicine

## 2012-05-02 ENCOUNTER — Other Ambulatory Visit: Payer: Self-pay | Admitting: Internal Medicine

## 2012-05-06 ENCOUNTER — Telehealth: Payer: Self-pay

## 2012-05-06 NOTE — Telephone Encounter (Signed)
Done. Thx.

## 2012-05-06 NOTE — Telephone Encounter (Signed)
Pt called requesting a letter from MD stating that she should be allowed to stand on an a mat instead of the concrete floors at work. Please advise.

## 2012-05-06 NOTE — Telephone Encounter (Signed)
Pt informed letter faxed to [phone] Attn: Marcelino Duster

## 2012-05-14 ENCOUNTER — Telehealth: Payer: Self-pay | Admitting: Internal Medicine

## 2012-05-14 ENCOUNTER — Other Ambulatory Visit: Payer: Self-pay | Admitting: *Deleted

## 2012-05-14 MED ORDER — DIAZEPAM 5 MG PO TABS: 5.0000 mg | ORAL_TABLET | Freq: Four times a day (QID) | ORAL | Status: DC | PRN

## 2012-05-14 NOTE — Telephone Encounter (Signed)
Pt ID# [id number], contact # [phone]. [facility] stating that insurance will only pay for 90 tablets in 75 days. I advised that the directions are 1 tablet twice a day. PA form is being faxed to address the increase in quantity. Will await fax. Carron Curie, CMA

## 2012-05-15 NOTE — Telephone Encounter (Signed)
I called back today and advised we have not received the fax. Rep advised that this med has been approved through 04-16-13 for bid. I called that pharmacy and spoke with Trinna Post and he ran the rx through and it was approved. Nothing further needed. Carron Curie, CMA

## 2012-05-18 ENCOUNTER — Other Ambulatory Visit: Payer: Self-pay | Admitting: Internal Medicine

## 2012-06-05 ENCOUNTER — Other Ambulatory Visit: Payer: Self-pay | Admitting: Internal Medicine

## 2012-06-06 ENCOUNTER — Other Ambulatory Visit: Payer: Self-pay | Admitting: Internal Medicine

## 2012-06-09 ENCOUNTER — Encounter: Payer: Self-pay | Admitting: Internal Medicine

## 2012-06-09 ENCOUNTER — Ambulatory Visit (INDEPENDENT_AMBULATORY_CARE_PROVIDER_SITE_OTHER): Payer: BC Managed Care – PPO | Admitting: Internal Medicine

## 2012-06-09 VITALS — BP 130/78 | HR 95 | Temp 98.8°F | Wt 135.0 lb

## 2012-06-09 DIAGNOSIS — I10 Essential (primary) hypertension: Secondary | ICD-10-CM

## 2012-06-09 DIAGNOSIS — M62838 Other muscle spasm: Secondary | ICD-10-CM

## 2012-06-09 DIAGNOSIS — J449 Chronic obstructive pulmonary disease, unspecified: Secondary | ICD-10-CM

## 2012-06-09 DIAGNOSIS — E559 Vitamin D deficiency, unspecified: Secondary | ICD-10-CM

## 2012-06-09 MED ORDER — AMLODIPINE-OLMESARTAN 5-20 MG PO TABS: 1.0000 | ORAL_TABLET | Freq: Every day | ORAL | Status: DC

## 2012-06-09 MED ORDER — CYCLOBENZAPRINE HCL 5 MG PO TABS
5.0000 mg | ORAL_TABLET | Freq: Two times a day (BID) | ORAL | Status: AC | PRN
Start: 2012-06-09 — End: 2012-06-19

## 2012-06-09 NOTE — Patient Instructions (Signed)
Take 1/2 tab of HCTZ or stop (cramps)

## 2012-06-09 NOTE — Assessment & Plan Note (Signed)
Continue with current prescription therapy as reflected on the Med list. On 2.5 mg Predn/d

## 2012-06-09 NOTE — Progress Notes (Signed)
  Subjective:    Patient ID: [name], female    DOB: Mar 03, 1946, 66 y.o.   MRN: [id number]  HPI   The patient presents for a follow-up of  chronic hypertension w/low BP lately, tremor, COPD, fatigue. C/o weakness, SOB. No diarrhea now.... Working 4 d/wk C/o low BP - taking 1/2 tab Azor  Wt Readings from Last 3 Encounters:  06/09/12 135 lb (61.236 kg)  03/07/12 131 lb 12.8 oz (59.784 kg)  12/10/11 125 lb (56.7 kg)    BP Readings from Last 3 Encounters:  06/09/12 130/78  03/07/12 158/90  12/10/11 122/78      Review of Systems  Constitutional: Positive for fatigue. Negative for activity change, appetite change and unexpected weight change.  HENT: Negative for congestion, mouth sores and sinus pressure.   Eyes: Negative for visual disturbance.  Respiratory: Negative for chest tightness.   Gastrointestinal: Negative for nausea and abdominal pain.  Genitourinary: Negative for frequency, difficulty urinating and vaginal pain.  Musculoskeletal: Negative for back pain and gait problem.  Skin: Negative for pallor.  Neurological: Positive for tremors. Negative for dizziness, weakness and numbness.  Psychiatric/Behavioral: Negative for confusion and disturbed wake/sleep cycle. The patient is nervous/anxious.        Objective:   Physical Exam  Constitutional: She appears well-developed. No distress.  HENT:  Head: Normocephalic.  Right Ear: External ear normal.  Left Ear: External ear normal.  Nose: Nose normal.  Mouth/Throat: Oropharynx is clear and moist.       Hoarse, no thrush  Eyes: Conjunctivae are normal. Pupils are equal, round, and reactive to light. Right eye exhibits no discharge. Left eye exhibits no discharge.  Neck: Normal range of motion. Neck supple. No JVD present. No tracheal deviation present. No thyromegaly present.  Cardiovascular: Normal rate, regular rhythm and normal heart sounds.   Pulmonary/Chest: Effort normal. No stridor. No respiratory distress.  She has no wheezes. She has no rales. She exhibits no tenderness.  Abdominal: Soft. Bowel sounds are normal. She exhibits no distension and no mass. There is no tenderness. There is no rebound and no guarding.  Musculoskeletal: She exhibits no edema and no tenderness.  Lymphadenopathy:    She has no cervical adenopathy.  Neurological: She displays normal reflexes. No cranial nerve deficit. She exhibits normal muscle tone. Coordination normal.       Mild tremor  Skin: No rash noted. No erythema.  Psychiatric: She has a normal mood and affect. Her behavior is normal. Judgment and thought content normal.   Lab Results  Component Value Date   WBC 22.5 Repeated and verified X2.* 10/01/2011   HGB 13.0 10/01/2011   HCT 37.8 10/01/2011   PLT 624.0* 10/01/2011   GLUCOSE 111* 10/01/2011   CHOL 226* 05/16/2010   TRIG 286.0* 05/16/2010   HDL 73.10 05/16/2010   LDLDIRECT 108.0 05/16/2010   ALT 15 10/01/2011   AST 15 10/01/2011   NA 138 10/01/2011   K 4.4 10/01/2011   CL 108 10/01/2011   CREATININE 1.3* 10/01/2011   BUN 32* 10/01/2011   CO2 20 10/01/2011   TSH 0.42 10/01/2011          Assessment & Plan:

## 2012-06-09 NOTE — Assessment & Plan Note (Addendum)
Flexeril prn Reduce dose or d/c HCTZ

## 2012-06-09 NOTE — Assessment & Plan Note (Signed)
Low BP - we reduced Azor dose

## 2012-06-09 NOTE — Assessment & Plan Note (Signed)
Continue with current prescription therapy as reflected on the Med list.  

## 2012-06-09 NOTE — Assessment & Plan Note (Signed)
We reduced Azor

## 2012-07-07 ENCOUNTER — Encounter: Payer: Self-pay | Admitting: Internal Medicine

## 2012-07-07 ENCOUNTER — Ambulatory Visit (INDEPENDENT_AMBULATORY_CARE_PROVIDER_SITE_OTHER): Payer: BC Managed Care – PPO | Admitting: Internal Medicine

## 2012-07-07 VITALS — BP 130/84 | HR 123 | Temp 99.1°F | Ht 61.5 in | Wt 136.2 lb

## 2012-07-07 DIAGNOSIS — J449 Chronic obstructive pulmonary disease, unspecified: Secondary | ICD-10-CM

## 2012-07-07 DIAGNOSIS — J9610 Chronic respiratory failure, unspecified whether with hypoxia or hypercapnia: Secondary | ICD-10-CM

## 2012-07-07 NOTE — Assessment & Plan Note (Addendum)
-   PFT's 12/07/08 FEV1 1.26(61%) ratio 42, 5% resp to B2  - PFT's 02/03/09 FEV1 .88 (38%) ratio 40  - PFT's 02/08/10 FEV1 1.30 (64%) ratio 45, 19% resp to B2 and DLC046% corrects to 63%  - PFT's 04/30/2011        1.28(64% ratio 46, no better b2  dlco  50%  - HFA technique 75% Mar 04, 2009 > 75% October 05, 2010> 90% November 09, 2010  - 02 dep at hs since April 2010, no desat walking October 05, 2010  - Prednsone maint since  April 25, 2009  - Add on Qvar 80 December 28, 2009 > d/c November 09, 2010 as not effective  - Dulera 200 started 11/09/10 >> much better December 21, 2010   GOLD II with no recent exac and even in setting of increase cough / allergic    Each maintenance medication was reviewed in detail including most importantly the difference between maintenance and as needed and under what circumstances the prns are to be used. This was done in the context of a medication calendar review which provided the patient with a user-friendly unambiguous mechanism for medication administration and reconciliation and provides an action plan for all active problems. It is critical that this be shown to every doctor  for modification during the office visit if necessary so the patient can use it as a working document.

## 2012-07-07 NOTE — Progress Notes (Signed)
Subjective:     Patient ID: [name], female   DOB: Nov 18, 1945    MRN: [id number]  HPI  36 yowf who quit smoking in December 2007, with copd with minimum asthmatic component  baseline = grocery store ok, tends to avoid mall or superstores and does use [facility] parking/ steroid dep since July 2010   October 05, 2010 ov co worse doe despite pred 20 and no longer benefit from saba, no assoc cough or noct/early am symptoms. .rec For stress try Valium 5 mg every 6 hours if needed  See calendar/ get bone density since can't get off prednsione.  November 09, 2010 ov sob no better, no cough.  rec start dulera and taper prednisone to a floor of 5 mg and as needed valium   December 21, 2010 ov better , tapered to 10 mg per day. no cough, breathing better with less mouth irritation on dulera. using valium no more than one daily.  rec 1) Try to reduce prednisone to 10 mg one half daily as your floor with option to back to 20 mg per day if worsen   08/01/2011 Follow up and med review/NP.  [name]. She says she is doing well with no flare in cough or wheezing. Dyspnea is at baseline w/ no increased SABA use. Last OV with no sign. decline in FEV1.  We reviewed all her meds and updated her med calendar with pt education.  She has decreased her prednisone to 5mg   Every other day. Wants to see if she can taper off, feels so better on lower doses and has been able to loose weight on lower steroid doses rec Follow med calendar closely and bring to each visit.  May try to taper off prednisone over next month, begin to take 5mg  1/2  Every other day for 2 weeks  Then 1/2 twice weekly for 2 weeks and then stop.   10/01/2011 f/u ov/Wert much worse off prednsione so now back  on prednisone 5 mg two each am breathing better and no need for saba. Generalized weakness/ anorexia also while off prednisone.   Imp steroid dep/ adrenal insuff. rec Prednisone 5 mg new ceiling is 2 daily until  better then maintain on 1 daily until return    11/19/2011 f/u ov/Wert cc Shortness of Breath Pt states breathing doing well- tapered herself to 5 mg prednisone daily -  denies cough at present time.  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  See calendar for specific medication instruction.  03/07/2012 f/u ov/Wert cc no change sob, min dry cough, increased prednisone to 5mg  per day with pollen but did fine on 5 mg until then and no change in activity tolerance and sleep fine.  No daytime rescue therapy with any form of saba. rec See calendar for specific medication instructions    07/07/2012 f/u ov/Wert following med calendar well cc worse cough/congestion x one week s sign sob, esp 1st thing in am on prednisone 5mg  one half daily,  On proair hfa  every other day never neb. Wears 02 at hs but usually  it's off when she wakes up and ? Whether really needs it.   No purulent sputum.   Sleeping ok without nocturnal flares  of respiratory  c/o's or need for noct saba. Also denies any obvious fluctuation of symptoms with weather or environmental changes or other aggravating or alleviating factors except as outlined above   ROS  The following are not active complaints unless bolded sore throat, dysphagia, dental problems, itching, sneezing,  nasal congestion or excess/ purulent secretions, ear ache,   fever, chills, sweats, unintended wt loss, pleuritic or exertional cp, hemoptysis,  orthopnea pnd or leg swelling, presyncope, palpitations, heartburn, abdominal pain, anorexia, nausea, vomiting, diarrhea  or change in bowel or urinary habits, change in stools or urine,  dysuria,hematuria,  rash, arthralgias, visual complaints, headache, numbness weakness or ataxia or problems with walking or coordination,  change in mood/affect or memory.           Past Medical History:  COPD  - PFT's 12/07/08 FEV1 1.26(61%) ratio 42, 5% resp to B2  - PFT's 02/03/09 FEV1 .88 (38%) ratio 40  - PFT's 02/08/10 FEV1 1.30 (64%) ratio 45, 19% resp to B2 and DLC046% corrects to 63%  - PFT's 04/30/2011        1.28(64%) ratio 46, no better b2  dlco  50%  - HFA technique 75% Mar 04, 2009 > 75% October 05, 2010> 90% November 09, 2010  - 02 dep at hs since April 2010, no desat walking October 05, 2010  - Prednsone maint April 25, 2009  - Add on Qvar 80 December 28, 2009 > d/c November 09, 2010 as not effective  - Dulera 200 started 11/09/10 >> much better December 21, 2010  Hypertension  Hives 07-2007  Allergic rhinitis  - Sinus ct 01/2009 wnl  Goiter 2009 > resected 12/29/08  Health Maintenance......................................[facility] [place]  - Pneumovax September 15, 2009  - 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 Nl external ear canals without cough reflex  NECK : without JVD/Nodes/TM/ nl carotid upstrokes bilaterally  LUNGS: barrel chest, marked increased t exp  But no wheeze CV: RRR no s3 or murmur or increase in P2, no edema  ABD: soft and nontender with nl excursion in the supine position. No bruits or organomegaly, bowel sounds nl  MS: warm without deformities, calf tenderness, cyanosis or clubbing    Assessment:         Plan:

## 2012-07-07 NOTE — Assessment & Plan Note (Signed)
-   02 at hs only at 1.5.pm since admit to [facility] ? 2011    - ONO ra 07/07/2012  Ordered at her request>>>

## 2012-07-07 NOTE — Patient Instructions (Addendum)
Please see patient coordinator before you leave today  to schedule overnight oxygens sats on room air   symbicort 160 is dulera equivalent > use Take 2 puffs first thing in am and then another 2 puffs about 12 hours later and if it's cheaper ok with me to change     Please schedule a follow up visit in 3  months but call sooner if needed to see Tammy with new medication calendar

## 2012-07-09 ENCOUNTER — Other Ambulatory Visit: Payer: Self-pay | Admitting: Internal Medicine

## 2012-07-11 NOTE — Telephone Encounter (Signed)
Please advise if okay to refill thanks 

## 2012-07-16 ENCOUNTER — Telehealth: Payer: Self-pay | Admitting: Internal Medicine

## 2012-07-16 NOTE — Telephone Encounter (Signed)
Called and spoke with patient, requesting results of ONO she done on 07/10/12 she states [facility] came and picked up information 07/11/12 and said they were sending to our office.  Have you seen these results or do we need to get [facility] to fax over another copy?  Please advise, thank you

## 2012-07-16 NOTE — Telephone Encounter (Signed)
Have not seen, refax

## 2012-07-17 NOTE — Telephone Encounter (Signed)
Fax received and placed in your lookat thanks

## 2012-07-17 NOTE — Telephone Encounter (Signed)
Spoke with Kayla at [facility] ([facility]) and notified needs to refax the ONO results She states that she will fax this now to triage  Will hold in my basket to ensure fax is received

## 2012-07-18 ENCOUNTER — Other Ambulatory Visit: Payer: Self-pay | Admitting: Internal Medicine

## 2012-07-18 ENCOUNTER — Encounter: Payer: Self-pay | Admitting: Internal Medicine

## 2012-07-18 NOTE — Telephone Encounter (Signed)
Called, spoke with pt. Informed her of ONO results on RA per Dr. Sherene Sires.  She verbalized understanding of this.  Pt states she has only been using 1.5 lpm qhs as this is what she was instructed to do.  Per Pt Care Coordination section in chart, it also states 1.5 lpm qhs.  Dr. Sherene Sires, will you pls clarify how much o2 pt should be on qhs.  Thank you.

## 2012-07-18 NOTE — Telephone Encounter (Signed)
1.5 lpm at hs is fine

## 2012-07-18 NOTE — Telephone Encounter (Signed)
Pt returned [name] call & can be reached at [phone]. [name]

## 2012-07-18 NOTE — Telephone Encounter (Signed)
Called, spoke with pt.  Informed her 1.5 lpm qhs is fine per Dr. Sherene Sires.  She verbalized understanding of this.  Nothing further needed.

## 2012-07-18 NOTE — Telephone Encounter (Signed)
Per MW- still desats with RA so needs to continue o2 2 lpm at hs.  LMTCB for pt

## 2012-07-24 ENCOUNTER — Telehealth: Payer: Self-pay | Admitting: Internal Medicine

## 2012-07-24 ENCOUNTER — Encounter: Payer: Self-pay | Admitting: Internal Medicine

## 2012-07-24 NOTE — Telephone Encounter (Signed)
Spoke with pt. She is c/o increased SOB and cough despite increasing pred and taking delsym. OV with MW tomorrow at 9 am. ED sooner if worsens.

## 2012-07-25 ENCOUNTER — Ambulatory Visit (INDEPENDENT_AMBULATORY_CARE_PROVIDER_SITE_OTHER): Payer: Medicare Other | Admitting: Internal Medicine

## 2012-07-25 ENCOUNTER — Encounter: Payer: Self-pay | Admitting: *Deleted

## 2012-07-25 ENCOUNTER — Encounter: Payer: Self-pay | Admitting: Internal Medicine

## 2012-07-25 VITALS — BP 122/78 | HR 114 | Temp 99.0°F | Ht 61.5 in | Wt 134.8 lb

## 2012-07-25 DIAGNOSIS — J449 Chronic obstructive pulmonary disease, unspecified: Secondary | ICD-10-CM

## 2012-07-25 DIAGNOSIS — J4489 Other specified chronic obstructive pulmonary disease: Secondary | ICD-10-CM

## 2012-07-25 MED ORDER — ALBUTEROL SULFATE (2.5 MG/3ML) 0.083% IN NEBU: 2.5000 mg | INHALATION_SOLUTION | Freq: Four times a day (QID) | RESPIRATORY_TRACT | Status: DC | PRN

## 2012-07-25 MED ORDER — AZITHROMYCIN 250 MG PO TABS: ORAL_TABLET | ORAL | Status: AC

## 2012-07-25 NOTE — Assessment & Plan Note (Signed)
-   PFT's 12/07/08 FEV1 1.26(61%) ratio 42, 5% resp to B2  - PFT's 02/03/09 FEV1 .88 (38%) ratio 40  - PFT's 02/08/10 FEV1 1.30 (64%) ratio 45, 19% resp to B2 and DLC046% corrects to 63%  - PFT's 04/30/2011        1.28(64% ratio 46, no better b2  dlco  50%  - HFA technique 75% Mar 04, 2009 > 75% October 05, 2010> 90% November 09, 2010  - 02 dep at hs since April 2010, no desat walking October 05, 2010  - Prednsone maint since  April 25, 2009  - Add on Qvar 80 December 28, 2009 > d/c November 09, 2010 as not effective  - Dulera 200 started 11/09/10 >> much better December 21, 2010   aecopd with purulent bronchitis and does not have a good feel at all for self management  I had an extended discussion with the patient today lasting 15 to 20 minutes of a 25 minute visit on the following issues:     Each maintenance medication was reviewed in detail including most importantly the difference between maintenance and as needed and under what circumstances the prns are to be used. This was done in the context of a medication calendar review which provided the patient with a user-friendly unambiguous mechanism for medication administration and reconciliation and provides an action plan for all active problems. It is critical that this be shown to every doctor  for modification during the office visit if necessary so the patient can use it as a working document.      For now will reset the "ceiling" for prednisone at 20 mg per day but leave the floor at 5 mg per day and rx with zpak

## 2012-07-25 NOTE — Progress Notes (Signed)
Subjective:     Patient ID: [name], female   DOB: 24-Dec-1945    MRN: [id number]  HPI  79 yowf who quit smoking in December 2007, with copd with minimum asthmatic component  baseline = grocery store ok, tends to avoid mall or superstores and does use [facility] parking/ steroid dep since July 2010   October 05, 2010 ov co worse doe despite pred 20 and no longer benefit from saba, no assoc cough or noct/early am symptoms. .rec For stress try Valium 5 mg every 6 hours if needed  See calendar/ get bone density since can't get off prednsione.  November 09, 2010 ov sob no better, no cough.  rec start dulera and taper prednisone to a floor of 5 mg and as needed valium   December 21, 2010 ov better , tapered to 10 mg per day. no cough, breathing better with less mouth irritation on dulera. using valium no more than one daily.  rec 1) Try to reduce prednisone to 10 mg one half daily as your floor with option to back to 20 mg per day if worsen   08/01/2011 Follow up and med review/NP.  [name]. She says she is doing well with no flare in cough or wheezing. Dyspnea is at baseline w/ no increased SABA use. Last OV with no sign. decline in FEV1.  We reviewed all her meds and updated her med calendar with pt education.  She has decreased her prednisone to 5mg   Every other day. Wants to see if she can taper off, feels so better on lower doses and has been able to loose weight on lower steroid doses rec Follow med calendar closely and bring to each visit.  May try to taper off prednisone over next month, begin to take 5mg  1/2  Every other day for 2 weeks  Then 1/2 twice weekly for 2 weeks and then stop.   10/01/2011 f/u ov/[name] much worse off prednsione so now back  on prednisone 5 mg two each am breathing better and no need for saba. Generalized weakness/ anorexia also while off prednisone.   Imp steroid dep/ adrenal insuff. rec Prednisone 5 mg new ceiling is 2 daily until  better then maintain on 1 daily until return    11/19/2011 f/u ov/[name] cc Shortness of Breath Pt states breathing doing well- tapered herself to 5 mg prednisone daily -  denies cough at present time.  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  See calendar for specific medication instruction.  03/07/2012 f/u ov/[name] cc no change sob, min dry cough, increased prednisone to 5mg  per day with pollen but did fine on 5 mg until then and no change in activity tolerance and sleep fine.  No daytime rescue therapy with any form of saba. rec See calendar for specific medication instructions    07/07/2012 f/u ov/[name] following med calendar well cc worse cough/congestion x one week s sign sob, esp 1st thing in am on prednisone 5mg  one half daily,  On proair hfa  every other day never neb. Wears 02 at hs but usually  it's off when she wakes up and ? Whether really needs it.     rec  schedule overnight oxygens sats on room air  symbicort 160 is dulera equivalent > use Take 2 puffs first thing in am and then another 2 puffs about 12 hours later and if it's cheaper ok with me to change  07/25/2012 f/u ov/[name] cc increased cough x 10 days, turned brown x 2 days and whereas was using proaire one a day now using 4x  By 9am- increased prednsone from 5 mg one half > take one hole (not on calendar that way) delsym instead of mucinex dm, not using neb either as per calendar. Not sob at rest sitting but some worse  With nocturnal flares  of  Cough/ wheeze and  need for noct saba.     Denies any obvious fluctuation of symptoms with weather or environmental changes or other aggravating or  alleviating factors except as outlined above   ROS  The following are not active complaints unless bolded sore throat, dysphagia, dental problems, itching, sneezing,  nasal congestion in am or excess/ purulent secretions, ear ache,   fever, chills, sweats, unintended wt loss, pleuritic or exertional cp, hemoptysis,  orthopnea pnd or leg swelling, presyncope, palpitations, heartburn, abdominal pain, anorexia, nausea, vomiting, diarrhea  or change in bowel or urinary habits, change in stools or urine, dysuria,hematuria,  rash, arthralgias, visual complaints, headache, numbness weakness or ataxia or problems with walking or coordination,  change in mood/affect or memory.           Past Medical History:  COPD  - PFT's 12/07/08 FEV1 1.26(61%) ratio 42, 5% resp to B2  - PFT's 02/03/09 FEV1 .88 (38%) ratio 40  - PFT's 02/08/10 FEV1 1.30 (64%) ratio 45, 19% resp to B2 and DLC046% corrects to 63%  - PFT's 04/30/2011        1.28(64%) ratio 46, no better b2  dlco  50%  - HFA technique 75% Mar 04, 2009 > 75% October 05, 2010> 90% November 09, 2010  - 02 dep at hs since April 2010, no desat walking October 05, 2010  - Prednsone maint April 25, 2009  - Add on Qvar 80 December 28, 2009 > d/c November 09, 2010 as not effective  - Dulera 200 started 11/09/10 >> much better December 21, 2010  Hypertension  Hives 07-2007  Allergic rhinitis  - Sinus ct 01/2009 wnl  Goiter 2009 > resected 12/29/08  Health Maintenance......................................[facility] [place]  - Pneumovax September 15, 2009  - 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 Nl external ear canals without cough reflex  NECK : without JVD/Nodes/TM/ nl carotid upstrokes bilaterally. LUNGS: barrel chest, marked increased t exp with distant wheeze pan exp before neb > better p CV: RRR no s3 or murmur or increase in P2, no edema  ABD: soft and nontender with nl excursion in the supine position. No bruits or organomegaly, bowel sounds nl  MS: warm without deformities, calf tenderness, cyanosis or clubbing    Assessment:  Plan:

## 2012-07-25 NOTE — Patient Instructions (Addendum)
Zpak called in as well as albuterol for your nebulizer Think of your respiratory medications as multiple steps you can take to control your symptoms and avoid having to go to the ER   Plan A is your maintenance daily no matter what meds: symbicort or dulera/ spiriva Plan B only use after you've used your maintenance (Plan A) medication, and only if you can't catch your breath: Proaire up to 2 every 3 hours Plan C only use after you've used plan A and B and still can't catch your breath: Albuterol per neb up to every 3 hours Plan D(for Doctor):  If you've used A thru C and not doing a lot better or still needing C more than a once a day,  D = call the doctor for evaluation asap Plan E (for ER):  If still not able to catch your breath, even after using your nebulizer up to every 3 hours, go to ER   Prednisone 20 mg daily until better, then 10 mg daily x 3 days, then 5 mg x 3 days then one half daily and if worsen go back up and start again  When coughing a lot, use maximum mucinex dm up to 1200 mg every 12 hours and add Pepcid 20 mg at bedtime and be sure you're taking 40 mg of either omeprazole before bfast   See Tammy NP w/in 2 weeks with all your medications, even over the counter meds, separated in two separate bags, the ones you take no matter what vs the ones you stop once you feel better and take only as needed when you feel you need them.   Tammy  will generate for you a new user friendly medication calendar that will put [place] all on the same page re: your medication use.     Without this process, it simply isn't possible to assure that we are providing  your outpatient care  with  the attention to detail we feel you deserve.   If we cannot assure that you're getting that kind of care,  then we cannot manage your problem effectively from this clinic.  Once you have seen Tammy and we are sure that we're all on the same page with your medication use she will arrange follow up with me.

## 2012-08-05 ENCOUNTER — Other Ambulatory Visit: Payer: Self-pay | Admitting: Internal Medicine

## 2012-08-06 ENCOUNTER — Ambulatory Visit (INDEPENDENT_AMBULATORY_CARE_PROVIDER_SITE_OTHER): Payer: Medicare Other | Admitting: Adult Health

## 2012-08-06 ENCOUNTER — Encounter: Payer: Self-pay | Admitting: Adult Health

## 2012-08-06 VITALS — BP 114/68 | HR 83 | Temp 98.0°F | Ht 61.5 in | Wt 134.4 lb

## 2012-08-06 DIAGNOSIS — J449 Chronic obstructive pulmonary disease, unspecified: Secondary | ICD-10-CM

## 2012-08-06 NOTE — Progress Notes (Signed)
Subjective:     Patient ID: [name], female   DOB: 24-Dec-1945    MRN: [id number]  HPI  79 yowf who quit smoking in December 2007, with copd with minimum asthmatic component  baseline = grocery store ok, tends to avoid mall or superstores and does use [facility] parking/ steroid dep since July 2010   October 05, 2010 ov co worse doe despite pred 20 and no longer benefit from saba, no assoc cough or noct/early am symptoms. .rec For stress try Valium 5 mg every 6 hours if needed  See calendar/ get bone density since can't get off prednsione.  November 09, 2010 ov sob no better, no cough.  rec start dulera and taper prednisone to a floor of 5 mg and as needed valium   December 21, 2010 ov better , tapered to 10 mg per day. no cough, breathing better with less mouth irritation on dulera. using valium no more than one daily.  rec 1) Try to reduce prednisone to 10 mg one half daily as your floor with option to back to 20 mg per day if worsen   08/01/2011 Follow up and med review/NP.  [name]. She says she is doing well with no flare in cough or wheezing. Dyspnea is at baseline w/ no increased SABA use. Last OV with no sign. decline in FEV1.  We reviewed all her meds and updated her med calendar with pt education.  She has decreased her prednisone to 5mg   Every other day. Wants to see if she can taper off, feels so better on lower doses and has been able to loose weight on lower steroid doses rec Follow med calendar closely and bring to each visit.  May try to taper off prednisone over next month, begin to take 5mg  1/2  Every other day for 2 weeks  Then 1/2 twice weekly for 2 weeks and then stop.   10/01/2011 f/u ov/Wert much worse off prednsione so now back  on prednisone 5 mg two each am breathing better and no need for saba. Generalized weakness/ anorexia also while off prednisone.   Imp steroid dep/ adrenal insuff. rec Prednisone 5 mg new ceiling is 2 daily until  better then maintain on 1 daily until return    11/19/2011 f/u ov/Wert cc Shortness of Breath Pt states breathing doing well- tapered herself to 5 mg prednisone daily -  denies cough at present time.  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  See calendar for specific medication instruction.  03/07/2012 f/u ov/Wert cc no change sob, min dry cough, increased prednisone to 5mg  per day with pollen but did fine on 5 mg until then and no change in activity tolerance and sleep fine.  No daytime rescue therapy with any form of saba. rec See calendar for specific medication instructions    07/07/2012 f/u ov/Wert following med calendar well cc worse cough/congestion x one week s sign sob, esp 1st thing in am on prednisone 5mg  one half daily,  On proair hfa  every other day never neb. Wears 02 at hs but usually  it's off when she wakes up and ? Whether really needs it.     rec  schedule overnight oxygens sats on room air  symbicort 160 is dulera equivalent > use Take 2 puffs first thing in am and then another 2 puffs about 12 hours later and if it's cheaper ok with me to change  07/25/2012 f/u ov/Wert cc increased cough x 10 days, turned brown x 2 days and whereas was using proaire one a day now using 4x  By 9am- increased prednsone from 5 mg one half > take one hole (not on calendar that way) delsym instead of mucinex dm, not using neb either as per calendar. Not sob at rest sitting but some worse  With nocturnal flares  of  Cough/ wheeze and  need for noct saba.   >Zpack and steroid burst   08/06/2012 Follow up and med review Patient returns for a two-week followup  with medication review. Seen 2 weeks ago for COPD flare. Treated with Z-Pak and a prednisone burst. Patient reports that she is much improved. His tapering down. Her prednisone currently on 5 mg We reviewed all her medications organized them into a medication calendar with patient education We paid close attention to her as needed meds , helping her to develop up planned when she developed certain symptoms. To use that medication to treat that particular symptom. She denies any hemoptysis, orthopnea, PND, or increased leg swelling        Past Medical History:  COPD  - PFT's 12/07/08 FEV1 1.26(61%) ratio 42, 5% resp to B2  - PFT's 02/03/09 FEV1 .88 (38%) ratio 40  - PFT's 02/08/10 FEV1 1.30 (64%) ratio 45, 19% resp to B2 and DLC046% corrects to 63%  - PFT's 04/30/2011        1.28(64%) ratio 46, no better b2  dlco  50%  - HFA technique 75% Mar 04, 2009 > 75% October 05, 2010> 90% November 09, 2010  - 02 dep at hs since April 2010, no desat walking October 05, 2010  - Prednsone maint April 25, 2009  - Add on Qvar 80 December 28, 2009 > d/c November 09, 2010 as not effective  - Dulera 200 started 11/09/10 >> much better December 21, 2010  Hypertension  Hives 07-2007  Allergic rhinitis  - Sinus ct 01/2009 wnl  Goiter 2009 > resected 12/29/08  Health Maintenance......................................[facility] [place]  - Pneumovax September 15, 2009  - 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 LUNGS: barrel chest,  coarse BS w/ no wheezing  CV: RRR no s3 or murmur or increase in P2, no edema  ABD: soft and nontender with nl excursion in the supine position. No bruits or organomegaly, bowel sounds nl  MS: warm without deformities, calf tenderness, cyanosis or clubbing    Assessment:         Plan:

## 2012-08-06 NOTE — Assessment & Plan Note (Signed)
Recent COPD flare now resolving  Patient's medications were reviewed today and patient education was given. Computerized medication calendar was adjusted/completed   Plan Continue on current regimen.  follow up Dr. Sherene Sires  In 4 months  Flu shot in am at work as planned

## 2012-08-06 NOTE — Patient Instructions (Addendum)
Continue on current regimen .  Follow up with Dr. Wert  In 4 months and As needed   

## 2012-08-07 ENCOUNTER — Ambulatory Visit: Payer: Self-pay | Admitting: Adult Health

## 2012-08-13 ENCOUNTER — Other Ambulatory Visit: Payer: Self-pay | Admitting: Internal Medicine

## 2012-08-13 NOTE — Addendum Note (Signed)
Addended by: Boone Master E on: 08/13/2012 12:09 PM   Modules accepted: Orders

## 2012-08-15 NOTE — Addendum Note (Signed)
Addended by: Boone Master E on: 08/15/2012 02:16 PM   Modules accepted: Orders

## 2012-08-25 ENCOUNTER — Other Ambulatory Visit: Payer: Self-pay | Admitting: Internal Medicine

## 2012-09-05 ENCOUNTER — Encounter: Payer: Self-pay | Admitting: Internal Medicine

## 2012-09-10 ENCOUNTER — Telehealth: Payer: Self-pay | Admitting: Internal Medicine

## 2012-09-10 NOTE — Telephone Encounter (Signed)
Received letter from [facility]- they are denying coverage of her dulera, and MW wants to give her samples of med to last until next ov. She does not have one pending so we need to schedule one for her. LMTCB.

## 2012-09-12 NOTE — Telephone Encounter (Signed)
LMTCB

## 2012-09-15 ENCOUNTER — Ambulatory Visit (INDEPENDENT_AMBULATORY_CARE_PROVIDER_SITE_OTHER)
Admission: RE | Admit: 2012-09-15 | Discharge: 2012-09-15 | Disposition: A | Discharge status: Home / Self Care | Payer: Medicare Other | Source: Ambulatory Visit | Attending: Internal Medicine | Admitting: Internal Medicine

## 2012-09-15 ENCOUNTER — Encounter: Payer: Self-pay | Admitting: Internal Medicine

## 2012-09-15 ENCOUNTER — Ambulatory Visit (INDEPENDENT_AMBULATORY_CARE_PROVIDER_SITE_OTHER): Payer: Medicare Other | Admitting: Internal Medicine

## 2012-09-15 ENCOUNTER — Telehealth: Payer: Self-pay | Admitting: Internal Medicine

## 2012-09-15 VITALS — BP 138/98 | HR 80 | Temp 99.0°F | Resp 16 | Wt 133.0 lb

## 2012-09-15 DIAGNOSIS — I10 Essential (primary) hypertension: Secondary | ICD-10-CM

## 2012-09-15 DIAGNOSIS — M545 Low back pain, unspecified: Secondary | ICD-10-CM | POA: Insufficient documentation

## 2012-09-15 DIAGNOSIS — R21 Rash and other nonspecific skin eruption: Secondary | ICD-10-CM

## 2012-09-15 DIAGNOSIS — Z789 Other specified health status: Secondary | ICD-10-CM

## 2012-09-15 DIAGNOSIS — Z9189 Other specified personal risk factors, not elsewhere classified: Secondary | ICD-10-CM

## 2012-09-15 MED ORDER — MOMETASONE FURO-FORMOTEROL FUM 200-5 MCG/ACT IN AERO: 2.0000 | INHALATION_SPRAY | Freq: Two times a day (BID) | RESPIRATORY_TRACT | Status: DC

## 2012-09-15 MED ORDER — TRAMADOL HCL 50 MG PO TABS: 50.0000 mg | ORAL_TABLET | Freq: Two times a day (BID) | ORAL | Status: DC | PRN

## 2012-09-15 MED ORDER — PREDNISONE 5 MG PO TABS: 5.0000 mg | ORAL_TABLET | Freq: Every day | ORAL | Status: DC

## 2012-09-15 NOTE — Telephone Encounter (Signed)
[name], please, inform patient that there is no fx on the x ray Thx

## 2012-09-15 NOTE — Telephone Encounter (Signed)
Patient returning call.

## 2012-09-15 NOTE — Assessment & Plan Note (Signed)
Options discussed On vit D

## 2012-09-15 NOTE — Telephone Encounter (Signed)
Samples at front. Pt is aware. [name], CMA  

## 2012-09-15 NOTE — Assessment & Plan Note (Signed)
Continue with current prescription therapy as reflected on the Med list.  

## 2012-09-15 NOTE — Telephone Encounter (Signed)
Triage, when you can will you please see if we have samples of dulera 200 and if so call the pt and let her know she can pick some up?? I am out of the office until Wed 12/4 thanks!!

## 2012-09-15 NOTE — Progress Notes (Signed)
Patient ID: [name], female   DOB: 1946/01/05, 66 y.o.   MRN: [id number]   Subjective:    Patient ID: [name], female    DOB: [birth year], 66 y.o.   MRN: [id number]  HPI  C/o a fall on Wed - fell in the kitchen and twisted/hit her R side LS spine C/o spasms in the back The patient presents for a follow-up of  chronic hypertension w/low BP lately, tremor, COPD, fatigue. C/o weakness, SOB. No diarrhea now.... Working 4 d/wk                 Review of Systems  Constitutional: Positive for fatigue. Negative for activity change, appetite change and unexpected weight change.  HENT: Negative for congestion, mouth sores and sinus pressure.   Eyes: Negative for visual disturbance.  Respiratory: Negative for chest tightness.   Gastrointestinal: Negative for nausea and abdominal pain.  Genitourinary: Negative for frequency, difficulty urinating and vaginal pain.  Musculoskeletal: Negative for back pain and gait problem.  Skin: Negative for pallor.  Neurological: Positive for tremors. Negative for dizziness, weakness and numbness.  Psychiatric/Behavioral: Negative for confusion and disturbed wake/sleep cycle. The patient is nervous/anxious.        Objective:   Physical Exam  Constitutional: She appears well-developed. No distress.  HENT:  Head: Normocephalic.  Right Ear: External ear normal.  Left Ear: External ear normal.  Nose: Nose normal.  Mouth/Throat: Oropharynx is clear and moist.       Hoarse, no thrush  Eyes: Conjunctivae are normal. Pupils are equal, round, and reactive to light. Right eye exhibits no discharge. Left eye exhibits no discharge.  Neck: Normal range of motion. Neck supple. No JVD present. No tracheal deviation present. No thyromegaly present.  Cardiovascular: Normal rate, regular rhythm and normal heart sounds.   Pulmonary/Chest: Effort normal. No stridor. No respiratory distress. She has no wheezes. She has no rales. She exhibits no tenderness.    Abdominal: Soft. Bowel sounds are normal. She exhibits no distension and no mass. There is no tenderness. There is no rebound and no guarding.  Musculoskeletal: She exhibits no edema and no tenderness.  Lymphadenopathy:    She has no cervical adenopathy.  Neurological: She displays normal reflexes. No cranial nerve deficit. She exhibits normal muscle tone. Coordination normal.       Mild tremor  Skin: Faint rash noted on her back. No erythema.  Psychiatric: She has a normal mood and affect. Her behavior is normal. Judgment and thought content normal.  Sacral area is tender on R  Lab Results         Assessment & Plan:

## 2012-09-15 NOTE — Assessment & Plan Note (Signed)
12/13 faint ?dry skin

## 2012-09-15 NOTE — Telephone Encounter (Signed)
Samples left at front. ATC x 3 but line is busy. WCB. Carron Curie, CMA

## 2012-09-15 NOTE — Assessment & Plan Note (Signed)
LS xray Tramadol Flexeril

## 2012-09-16 NOTE — Telephone Encounter (Signed)
Pt informed

## 2012-10-01 ENCOUNTER — Encounter: Payer: Self-pay | Admitting: Internal Medicine

## 2012-10-01 ENCOUNTER — Ambulatory Visit (INDEPENDENT_AMBULATORY_CARE_PROVIDER_SITE_OTHER): Payer: Medicare Other | Admitting: Internal Medicine

## 2012-10-01 VITALS — BP 128/72 | HR 76 | Temp 97.8°F | Resp 16 | Wt 131.0 lb

## 2012-10-01 DIAGNOSIS — I10 Essential (primary) hypertension: Secondary | ICD-10-CM

## 2012-10-01 DIAGNOSIS — J4489 Other specified chronic obstructive pulmonary disease: Secondary | ICD-10-CM

## 2012-10-01 DIAGNOSIS — L501 Idiopathic urticaria: Secondary | ICD-10-CM

## 2012-10-01 DIAGNOSIS — J449 Chronic obstructive pulmonary disease, unspecified: Secondary | ICD-10-CM

## 2012-10-01 DIAGNOSIS — G251 Drug-induced tremor: Secondary | ICD-10-CM

## 2012-10-01 DIAGNOSIS — M545 Low back pain, unspecified: Secondary | ICD-10-CM

## 2012-10-01 DIAGNOSIS — G250 Essential tremor: Secondary | ICD-10-CM

## 2012-10-01 MED ORDER — AMLODIPINE BESYLATE 5 MG PO TABS: 5.0000 mg | ORAL_TABLET | Freq: Every day | ORAL | Status: DC

## 2012-10-01 MED ORDER — LOSARTAN POTASSIUM 100 MG PO TABS: 100.0000 mg | ORAL_TABLET | Freq: Every day | ORAL | Status: DC

## 2012-10-01 NOTE — Assessment & Plan Note (Signed)
Discussed.

## 2012-10-01 NOTE — Assessment & Plan Note (Signed)
Continue with current prescription therapy as reflected on the Med list.  

## 2012-10-01 NOTE — Assessment & Plan Note (Signed)
Resolved. Talked about fall prevention

## 2012-10-01 NOTE — Progress Notes (Signed)
   Subjective:    Patient ID: [name], female    DOB: 07-Feb-1946, 67 y.o.   MRN: [id number]  HPI  F/u on a fall on Wed - fell in the kitchen and twisted/hit her R side LS spine The spasms in the back have resolved  The patient presents for a follow-up of  chronic hypertension w/low BP lately, tremor, COPD, fatigue. C/o weakness, SOB. No diarrhea now.... Working 4 d/wk                 Review of Systems  Constitutional: Positive for fatigue. Negative for activity change, appetite change and unexpected weight change.  HENT: Negative for congestion, mouth sores and sinus pressure.   Eyes: Negative for visual disturbance.  Respiratory: Negative for chest tightness.   Gastrointestinal: Negative for nausea and abdominal pain.  Genitourinary: Negative for frequency, difficulty urinating and vaginal pain.  Musculoskeletal: Negative for back pain and gait problem.  Skin: Negative for pallor.  Neurological: Positive for tremors. Negative for dizziness, weakness and numbness.  Psychiatric/Behavioral: Negative for confusion and disturbed wake/sleep cycle. The patient is nervous/anxious.        Objective:   Physical Exam  Constitutional: She appears well-developed. No distress.  HENT:  Head: Normocephalic.  Right Ear: External ear normal.  Left Ear: External ear normal.  Nose: Nose normal.  Mouth/Throat: Oropharynx is clear and moist.       Hoarse, no thrush  Eyes: Conjunctivae are normal. Pupils are equal, round, and reactive to light. Right eye exhibits no discharge. Left eye exhibits no discharge.  Neck: Normal range of motion. Neck supple. No JVD present. No tracheal deviation present. No thyromegaly present.  Cardiovascular: Normal rate, regular rhythm and normal heart sounds.   Pulmonary/Chest: Effort normal. No stridor. No respiratory distress. She has no wheezes. She has no rales. She exhibits no tenderness.  Abdominal: Soft. Bowel sounds are normal. She exhibits no  distension and no mass. There is no tenderness. There is no rebound and no guarding.  Musculoskeletal: She exhibits no edema and no tenderness.  Lymphadenopathy:    She has no cervical adenopathy.  Neurological: She displays normal reflexes. No cranial nerve deficit. She exhibits normal muscle tone. Coordination normal.       Mild tremor  Skin: Faint rash noted on her back. No erythema.  Psychiatric: She has a normal mood and affect. Her behavior is normal. Judgment and thought content normal.    Lab Results         Assessment & Plan:

## 2012-10-01 NOTE — Assessment & Plan Note (Signed)
No relapse 

## 2012-10-01 NOTE — Assessment & Plan Note (Signed)
Changed rx to amlod+losartan due to cost

## 2012-10-02 ENCOUNTER — Other Ambulatory Visit: Payer: Self-pay | Admitting: Internal Medicine

## 2012-10-03 ENCOUNTER — Other Ambulatory Visit: Payer: Self-pay | Admitting: Internal Medicine

## 2012-10-09 ENCOUNTER — Other Ambulatory Visit: Payer: Self-pay | Admitting: Internal Medicine

## 2012-10-10 ENCOUNTER — Ambulatory Visit: Payer: BC Managed Care – PPO | Admitting: Internal Medicine

## 2012-10-15 DIAGNOSIS — J189 Pneumonia, unspecified organism: Secondary | ICD-10-CM

## 2012-10-15 HISTORY — DX: Pneumonia, unspecified organism: J18.9

## 2012-10-17 ENCOUNTER — Ambulatory Visit: Payer: BC Managed Care – PPO | Admitting: Adult Health

## 2012-11-05 ENCOUNTER — Other Ambulatory Visit: Payer: Self-pay | Admitting: Internal Medicine

## 2012-12-01 ENCOUNTER — Other Ambulatory Visit: Payer: Self-pay | Admitting: Internal Medicine

## 2012-12-02 ENCOUNTER — Encounter: Payer: Self-pay | Admitting: Internal Medicine

## 2012-12-02 ENCOUNTER — Ambulatory Visit (INDEPENDENT_AMBULATORY_CARE_PROVIDER_SITE_OTHER): Payer: Medicare Other | Admitting: Internal Medicine

## 2012-12-02 VITALS — BP 140/80 | HR 100 | Temp 98.5°F | Ht 61.5 in | Wt 138.6 lb

## 2012-12-02 DIAGNOSIS — J449 Chronic obstructive pulmonary disease, unspecified: Secondary | ICD-10-CM

## 2012-12-02 DIAGNOSIS — J4489 Other specified chronic obstructive pulmonary disease: Secondary | ICD-10-CM

## 2012-12-02 MED ORDER — AZITHROMYCIN 250 MG PO TABS: ORAL_TABLET | ORAL | Status: DC

## 2012-12-02 MED ORDER — ALBUTEROL SULFATE HFA 108 (90 BASE) MCG/ACT IN AERS: 2.0000 | INHALATION_SPRAY | RESPIRATORY_TRACT | Status: DC | PRN

## 2012-12-02 MED ORDER — TIOTROPIUM BROMIDE MONOHYDRATE 18 MCG IN CAPS: 18.0000 ug | ORAL_CAPSULE | Freq: Every day | RESPIRATORY_TRACT | Status: DC

## 2012-12-02 NOTE — Patient Instructions (Addendum)
If nasty mucus > take zpak  [email or web address] can be used for your generics at a very good discount cash only  Amlodipine, omeprazole, losartan (cozaar)   Please schedule a follow up visit in 3 months but call sooner if needed to see Tammy

## 2012-12-02 NOTE — Assessment & Plan Note (Signed)
-   PFT's 12/07/08 FEV1 1.26(61%) ratio 42, 5% resp to B2  - PFT's 02/03/09 FEV1 .88 (38%) ratio 40  - PFT's 02/08/10 FEV1 1.30 (64%) ratio 45, 19% resp to B2 and DLC046% corrects to 63%  - PFT's 04/30/2011        1.28(64% ratio 46, no better b2  dlco  50%  - HFA technique 75% Mar 04, 2009 > 75% October 05, 2010> 90% November 09, 2010  - 02 dep at hs since April 2010, no desat walking October 05, 2010  - Prednsone maint since  April 25, 2009  - Add on Qvar 80 December 28, 2009 > d/c November 09, 2010 as not effective  - Dulera 200 started 11/09/10 >> much better December 21, 2010    GOLD II with freq flares and steroid dependent, albeit at near physiologic levels  I had an extended discussion with the patient today lasting 15 to 20 minutes of a 25 minute visit on the following issues:    Each maintenance medication was reviewed in detail including most importantly the difference between maintenance and as needed and under what circumstances the prns are to be used. This was done in the context of a medication calendar review which provided the patient with a user-friendly unambiguous mechanism for medication administration and reconciliation and provides an action plan for all active problems. It is critical that this be shown to every doctor  for modification during the office visit if necessary so the patient can use it as a working document.

## 2012-12-02 NOTE — Progress Notes (Signed)
Subjective:     Patient ID: [name], female   DOB: [birth year]    MRN: [id number]  HPI  47 yowf who quit smoking in December 2007, with copd with minimum asthmatic component  baseline = grocery store ok, tends to avoid mall or superstores and does use [facility] parking/ steroid dep since July 2010   October 05, 2010 ov co worse doe despite pred 20 and no longer benefit from saba, no assoc cough or noct/early am symptoms. .rec For stress try Valium 5 mg every 6 hours if needed  See calendar/ get bone density since can't get off prednsione.  November 09, 2010 ov sob no better, no cough.  rec start dulera and taper prednisone to a floor of 5 mg and as needed valium   December 21, 2010 ov better , tapered to 10 mg per day. no cough, breathing better with less mouth irritation on dulera. using valium no more than one daily.  rec 1) Try to reduce prednisone to 10 mg one half daily as your floor with option to back to 20 mg per day if worsen   08/01/2011 Follow up and med review/NP.  [name]. She says she is doing well with no flare in cough or wheezing. Dyspnea is at baseline w/ no increased SABA use. Last OV with no sign. decline in FEV1.  We reviewed all her meds and updated her med calendar with pt education.  She has decreased her prednisone to 5mg   Every other day. Wants to see if she can taper off, feels so better on lower doses and has been able to loose weight on lower steroid doses rec Follow med calendar closely and bring to each visit.  May try to taper off prednisone over next month, begin to take 5mg  1/2  Every other day for 2 weeks  Then 1/2 twice weekly for 2 weeks and then stop.   10/01/2011 f/u ov/Wert much worse off prednsione so now back  on prednisone 5 mg two each am breathing better and no need for saba. Generalized weakness/ anorexia also while off prednisone.   Imp steroid dep/ adrenal insuff. rec Prednisone 5 mg new ceiling is 2 daily until  better then maintain on 1 daily until return    11/19/2011 f/u ov/Wert cc Shortness of Breath Pt states breathing doing well- tapered herself to 5 mg prednisone daily -  denies cough at present time.  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  See calendar for specific medication instruction.  03/07/2012 f/u ov/Wert cc no change sob, min dry cough, increased prednisone to 5mg  per day with pollen but did fine on 5 mg until then and no change in activity tolerance and sleep fine.  No daytime rescue therapy with any form of saba. rec See calendar for specific medication instructions    07/07/2012 f/u ov/Wert following med calendar well cc worse cough/congestion x one week s sign sob, esp 1st thing in am on prednisone 5mg  one half daily,  On proair hfa  every other day never neb. Wears 02 at hs but usually  it's off when she wakes up and ? Whether really needs it.     rec  schedule overnight oxygens sats on room air  symbicort 160 is dulera equivalent > use Take 2 puffs first thing in am and then another 2 puffs about 12 hours later and if it's cheaper ok with me to change  07/25/2012 f/u ov/Wert cc increased cough x 10 days, turned brown x 2 days and whereas was using proaire one a day now using 4x  By 9am- increased prednsone from 5 mg one half > take one hole (not on calendar that way) delsym instead of mucinex dm, not using neb either as per calendar. Not sob at rest sitting but some worse  With nocturnal flares  of  Cough/ wheeze and  need for noct saba.   >Zpack and steroid burst   08/06/2012 Follow up and med review Patient returns for a two-week followup  with medication review. Seen 2 weeks ago for COPD flare. Treated with Z-Pak and a prednisone burst. Patient reports that she is much improved. His tapering down. Her prednisone currently on 5 mg rec  follow med calendar  12/02/2012 f/u ov/Wert cc  breathing is doing ok-- x1 week has been coughing w clear mucus and sneezing---denies nay other concerns, following the action plan well down to prednisone 5 mg one half daily and no need to increase since last ov.  Confuse a bit with substitution of generics on the maintenance list.    No obvious daytime variabilty or assoc purulent sputum or cp or chest tightness, subjective wheeze overt sinus or hb symptoms. No unusual exp hx   Sleeping ok without nocturnal  or early am exacerbation  of respiratory  c/o's or need for noct saba. Also denies any obvious fluctuation of symptoms with weather or environmental changes or other aggravating or alleviating factors except as outlined above   ROS  The following are not active complaints unless bolded sore throat, dysphagia, dental problems, itching, sneezing,  nasal congestion or excess/ purulent secretions, ear ache,   fever, chills, sweats, unintended wt loss, pleuritic or exertional cp, hemoptysis,  orthopnea pnd or leg swelling, presyncope, palpitations, heartburn, abdominal pain, anorexia, nausea, vomiting, diarrhea  or change in bowel or urinary habits, change in stools or urine, dysuria,hematuria,  rash, arthralgias, visual complaints, headache, numbness weakness or ataxia or problems with walking or coordination,  change in mood/affect or memory.                 Past Medical History:  COPD  - PFT's 12/07/08 FEV1 1.26(61%) ratio 42, 5% resp to B2  - PFT's 02/03/09 FEV1 .88 (38%) ratio 40  - PFT's 02/08/10 FEV1 1.30 (64%) ratio 45, 19% resp to B2 and DLC046% corrects to 63%  - PFT's 04/30/2011        1.28(64%) ratio 46, no better b2  dlco  50%  - HFA technique 75% Mar 04, 2009 > 75% October 05, 2010>  90% November 09, 2010  - 02 dep at hs since April 2010, no desat walking October 05, 2010  - Prednsone maint April 25, 2009  - Add on Qvar 80 December 28, 2009 > d/c November 09, 2010 as not effective  - Dulera 200 started 11/09/10 >> much better December 21, 2010  Hypertension  Hives 07-2007  Allergic rhinitis  - Sinus ct 01/2009 wnl  Goiter 2009 > resected 12/29/08  Health Maintenance......................................[facility] [place]  - Pneumovax September 15, 2009  - DEXA 10/30/2009 T  spine -1.7, L Fem -1.9, 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 LUNGS: barrel chest, distant BS w/ no wheezing  CV: RRR no s3 or murmur or increase in P2, no edema  ABD: soft and nontender with nl excursion in the supine position. No bruits or organomegaly, bowel sounds nl  MS: warm without deformities, calf tenderness, cyanosis or clubbing    Assessment:         Plan:

## 2013-01-02 ENCOUNTER — Ambulatory Visit: Payer: Medicare Other | Admitting: Internal Medicine

## 2013-01-04 ENCOUNTER — Other Ambulatory Visit: Payer: Self-pay | Admitting: Internal Medicine

## 2013-01-05 ENCOUNTER — Other Ambulatory Visit (INDEPENDENT_AMBULATORY_CARE_PROVIDER_SITE_OTHER): Payer: Medicare Other

## 2013-01-05 ENCOUNTER — Encounter: Payer: Self-pay | Admitting: Internal Medicine

## 2013-01-05 ENCOUNTER — Other Ambulatory Visit: Payer: Self-pay | Admitting: Internal Medicine

## 2013-01-05 ENCOUNTER — Ambulatory Visit (INDEPENDENT_AMBULATORY_CARE_PROVIDER_SITE_OTHER): Payer: Medicare Other | Admitting: Internal Medicine

## 2013-01-05 VITALS — BP 150/88 | HR 84 | Temp 98.4°F | Resp 16 | Wt 141.0 lb

## 2013-01-05 DIAGNOSIS — R7309 Other abnormal glucose: Secondary | ICD-10-CM

## 2013-01-05 DIAGNOSIS — I10 Essential (primary) hypertension: Secondary | ICD-10-CM

## 2013-01-05 DIAGNOSIS — R739 Hyperglycemia, unspecified: Secondary | ICD-10-CM

## 2013-01-05 DIAGNOSIS — J449 Chronic obstructive pulmonary disease, unspecified: Secondary | ICD-10-CM

## 2013-01-05 DIAGNOSIS — G250 Essential tremor: Secondary | ICD-10-CM

## 2013-01-05 DIAGNOSIS — M545 Low back pain, unspecified: Secondary | ICD-10-CM

## 2013-01-05 DIAGNOSIS — J309 Allergic rhinitis, unspecified: Secondary | ICD-10-CM

## 2013-01-05 DIAGNOSIS — G251 Drug-induced tremor: Secondary | ICD-10-CM

## 2013-01-05 DIAGNOSIS — G252 Other specified forms of tremor: Secondary | ICD-10-CM

## 2013-01-05 DIAGNOSIS — E559 Vitamin D deficiency, unspecified: Secondary | ICD-10-CM

## 2013-01-05 DIAGNOSIS — J4489 Other specified chronic obstructive pulmonary disease: Secondary | ICD-10-CM

## 2013-01-05 LAB — CBC WITH DIFFERENTIAL/PLATELET
Eosinophils Absolute: 0.1 10*3/uL (ref 0.0–0.7)
Eosinophils Relative: 0.8 % (ref 0.0–5.0)
HCT: 39.6 % (ref 36.0–46.0)
Lymphs Abs: 0.8 10*3/uL (ref 0.7–4.0)
MCHC: 33.9 g/dL (ref 30.0–36.0)
MCV: 95.2 fl (ref 78.0–100.0)
Monocytes Absolute: 0.9 10*3/uL (ref 0.1–1.0)
Neutrophils Relative %: 84.4 % — ABNORMAL HIGH (ref 43.0–77.0)
Platelets: 300 10*3/uL (ref 150.0–400.0)

## 2013-01-05 LAB — BASIC METABOLIC PANEL
BUN: 19 mg/dL (ref 6–23)
Calcium: 9.9 mg/dL (ref 8.4–10.5)
GFR: 52.91 mL/min — ABNORMAL LOW (ref >60.00)
Glucose, Bld: 113 mg/dL — ABNORMAL HIGH (ref 70–99)
Potassium: 4 mEq/L (ref 3.5–5.1)
Sodium: 137 mEq/L (ref 135–145)

## 2013-01-05 LAB — HEMOGLOBIN A1C: Hgb A1c MFr Bld: 5.4 % (ref 4.6–6.5)

## 2013-01-05 MED ORDER — TRAMADOL HCL 50 MG PO TABS: 50.0000 mg | ORAL_TABLET | Freq: Two times a day (BID) | ORAL | Status: DC | PRN

## 2013-01-05 NOTE — Assessment & Plan Note (Signed)
resolved 

## 2013-01-05 NOTE — Progress Notes (Signed)
   Subjective:    Patient ID: [name], female    DOB: September 29, 1946, 67 y.o.   MRN: [id number]  HPI   The spasms in the back have resolved  The patient presents for a follow-up of  chronic hypertension w/low BP lately - nl BP at home  F/u on tremor - better, COPD, fatigue. C/o weakness, SOB. Working 4 d/wk  C/o leg pains at night   BP Readings from Last 3 Encounters:  01/05/13 150/88  12/02/12 140/80  10/01/12 128/72   Wt Readings from Last 3 Encounters:  01/05/13 141 lb (63.957 kg)  12/02/12 138 lb 9.6 oz (62.869 kg)  10/01/12 131 lb (59.421 kg)                  Review of Systems  Constitutional: Positive for fatigue. Negative for activity change, appetite change and unexpected weight change.  HENT: Negative for congestion, mouth sores and sinus pressure.   Eyes: Negative for visual disturbance.  Respiratory: Negative for chest tightness.   Gastrointestinal: Negative for nausea and abdominal pain.  Genitourinary: Negative for frequency, difficulty urinating and vaginal pain.  Musculoskeletal: Negative for back pain and gait problem.  Skin: Negative for pallor.  Neurological: Positive for tremors. Negative for dizziness, weakness and numbness.  Psychiatric/Behavioral: Negative for confusion and disturbed wake/sleep cycle. The patient is nervous/anxious.        Objective:   Physical Exam  Constitutional: She appears well-developed. No distress.  HENT:  Head: Normocephalic.  Right Ear: External ear normal.  Left Ear: External ear normal.  Nose: Nose normal.  Mouth/Throat: Oropharynx is clear and moist.       Hoarse, no thrush  Eyes: Conjunctivae are normal. Pupils are equal, round, and reactive to light. Right eye exhibits no discharge. Left eye exhibits no discharge.  Neck: Normal range of motion. Neck supple. No JVD present. No tracheal deviation present. No thyromegaly present.  Cardiovascular: Normal rate, regular rhythm and normal heart sounds.    Pulmonary/Chest: Effort normal. No stridor. No respiratory distress. She has no wheezes. She has no rales. She exhibits no tenderness.  Abdominal: Soft. Bowel sounds are normal. She exhibits no distension and no mass. There is no tenderness. There is no rebound and no guarding.  Musculoskeletal: She exhibits no edema and no tenderness.  Lymphadenopathy:    She has no cervical adenopathy.  Neurological: She displays normal reflexes. No cranial nerve deficit. She exhibits normal muscle tone. Coordination normal.       Mild tremor  Skin: Faint rash noted on her back. No erythema.  Psychiatric: She has a normal mood and affect. Her behavior is normal. Judgment and thought content normal.   Tremor is much better  Lab Results   Lab Results  Component Value Date   WBC 22.5 Repeated and verified X2.* 10/01/2011   HGB 13.0 10/01/2011   HCT 37.8 10/01/2011   PLT 624.0* 10/01/2011   GLUCOSE 111* 10/01/2011   CHOL 226* 05/16/2010   TRIG 286.0* 05/16/2010   HDL 73.10 05/16/2010   LDLDIRECT 108.0 05/16/2010   ALT 15 10/01/2011   AST 15 10/01/2011   NA 138 10/01/2011   K 4.4 10/01/2011   CL 108 10/01/2011   CREATININE 1.3* 10/01/2011   BUN 32* 10/01/2011   CO2 20 10/01/2011   TSH 0.42 10/01/2011         Assessment & Plan:

## 2013-01-05 NOTE — Assessment & Plan Note (Signed)
Continue with current prescription therapy as reflected on the Med list. BP Readings from Last 3 Encounters:  01/05/13 150/88  12/02/12 140/80  10/01/12 128/72

## 2013-01-05 NOTE — Telephone Encounter (Signed)
Last refilled 07/11/12 # 120 tablets with no refills Please advise if okay to call in for her, thanks

## 2013-01-05 NOTE — Assessment & Plan Note (Signed)
On Prednisone 

## 2013-01-05 NOTE — Assessment & Plan Note (Signed)
Continue with current prescription therapy as reflected on the Med list.  

## 2013-01-05 NOTE — Assessment & Plan Note (Signed)
Better  

## 2013-01-08 ENCOUNTER — Encounter: Payer: Self-pay | Admitting: Internal Medicine

## 2013-01-13 ENCOUNTER — Other Ambulatory Visit: Payer: Self-pay | Admitting: Internal Medicine

## 2013-02-18 ENCOUNTER — Other Ambulatory Visit: Payer: Self-pay | Admitting: Internal Medicine

## 2013-02-19 ENCOUNTER — Other Ambulatory Visit: Payer: Self-pay | Admitting: Internal Medicine

## 2013-03-02 ENCOUNTER — Ambulatory Visit (INDEPENDENT_AMBULATORY_CARE_PROVIDER_SITE_OTHER): Payer: Medicare Other | Admitting: Adult Health

## 2013-03-02 ENCOUNTER — Ambulatory Visit (INDEPENDENT_AMBULATORY_CARE_PROVIDER_SITE_OTHER)
Admission: RE | Admit: 2013-03-02 | Discharge: 2013-03-02 | Disposition: A | Discharge status: Home / Self Care | Payer: Medicare Other | Source: Ambulatory Visit | Attending: Adult Health | Admitting: Adult Health

## 2013-03-02 ENCOUNTER — Encounter: Payer: Self-pay | Admitting: Adult Health

## 2013-03-02 VITALS — BP 130/74 | HR 90 | Temp 98.6°F | Ht 61.25 in | Wt 138.2 lb

## 2013-03-02 DIAGNOSIS — J449 Chronic obstructive pulmonary disease, unspecified: Secondary | ICD-10-CM

## 2013-03-02 NOTE — Progress Notes (Signed)
Subjective:     Patient ID: [name], female   DOB: 24-Dec-1945    MRN: [id number]  HPI  79 yowf who quit smoking in December 2007, with copd with minimum asthmatic component  baseline = grocery store ok, tends to avoid mall or superstores and does use [facility] parking/ steroid dep since July 2010   October 05, 2010 ov co worse doe despite pred 20 and no longer benefit from saba, no assoc cough or noct/early am symptoms. .rec For stress try Valium 5 mg every 6 hours if needed  See calendar/ get bone density since can't get off prednsione.  November 09, 2010 ov sob no better, no cough.  rec start dulera and taper prednisone to a floor of 5 mg and as needed valium   December 21, 2010 ov better , tapered to 10 mg per day. no cough, breathing better with less mouth irritation on dulera. using valium no more than one daily.  rec 1) Try to reduce prednisone to 10 mg one half daily as your floor with option to back to 20 mg per day if worsen   08/01/2011 Follow up and med review/NP.  [name]. She says she is doing well with no flare in cough or wheezing. Dyspnea is at baseline w/ no increased SABA use. Last OV with no sign. decline in FEV1.  We reviewed all her meds and updated her med calendar with pt education.  She has decreased her prednisone to 5mg   Every other day. Wants to see if she can taper off, feels so better on lower doses and has been able to loose weight on lower steroid doses rec Follow med calendar closely and bring to each visit.  May try to taper off prednisone over next month, begin to take 5mg  1/2  Every other day for 2 weeks  Then 1/2 twice weekly for 2 weeks and then stop.   10/01/2011 f/u ov/Wert much worse off prednsione so now back  on prednisone 5 mg two each am breathing better and no need for saba. Generalized weakness/ anorexia also while off prednisone.   Imp steroid dep/ adrenal insuff. rec Prednisone 5 mg new ceiling is 2 daily until  better then maintain on 1 daily until return    11/19/2011 f/u ov/Wert cc Shortness of Breath Pt states breathing doing well- tapered herself to 5 mg prednisone daily -  denies cough at present time.  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  See calendar for specific medication instruction.  03/07/2012 f/u ov/Wert cc no change sob, min dry cough, increased prednisone to 5mg  per day with pollen but did fine on 5 mg until then and no change in activity tolerance and sleep fine.  No daytime rescue therapy with any form of saba. rec See calendar for specific medication instructions    07/07/2012 f/u ov/Wert following med calendar well cc worse cough/congestion x one week s sign sob, esp 1st thing in am on prednisone 5mg  one half daily,  On proair hfa  every other day never neb. Wears 02 at hs but usually  it's off when she wakes up and ? Whether really needs it.     rec  schedule overnight oxygens sats on room air  symbicort 160 is dulera equivalent > use Take 2 puffs first thing in am and then another 2 puffs about 12 hours later and if it's cheaper ok with me to change  07/25/2012 f/u ov/Wert cc increased cough x 10 days, turned brown x 2 days and whereas was using proaire one a day now using 4x  By 9am- increased prednsone from 5 mg one half > take one hole (not on calendar that way) delsym instead of mucinex dm, not using neb either as per calendar. Not sob at rest sitting but some worse  With nocturnal flares  of  Cough/ wheeze and  need for noct saba.   >Zpack and steroid burst   08/06/2012 Follow up and med review Patient returns for a two-week followup  with medication review. Seen 2 weeks ago for COPD flare. Treated with Z-Pak and a prednisone burst. Patient reports that she is much improved. His tapering down. Her prednisone currently on 5 mg rec  follow med calendar  12/02/2012 f/u ov/Wert cc  breathing is doing ok-- x1 week has been coughing w clear mucus and sneezing---denies nay other concerns, following the action plan well down to prednisone 5 mg one half daily and no need to increase since last ov.  Confuse a bit with substitution of generics on the maintenance list.   >no changes   03/02/2013 Follow up and med review     [name]. She says she is doing well with mild increased drainage with high pollen counts. Zyrtec is helping.  We reviewed all her meds and updated her med calendar with pt education.  She appears to be taking meds correctly.  No flare in cough, wheezing, hemoptysis, edema or fever.       Past Medical History:  COPD  - PFT's 12/07/08 FEV1 1.26(61%) ratio 42, 5% resp to B2  - PFT's 02/03/09 FEV1 .88 (38%) ratio 40  - PFT's 02/08/10 FEV1 1.30 (64%) ratio 45, 19% resp to B2 and DLC046% corrects to 63%  - PFT's 04/30/2011        1.28(64%) ratio 46, no better b2  dlco  50%  - HFA technique 75% Mar 04, 2009 > 75% October 05, 2010> 90% November 09, 2010  - 02 dep at hs since April 2010, no desat walking October 05, 2010  - Prednsone maint April 25, 2009  - Add on Qvar 80 December 28, 2009 > d/c November 09, 2010 as not effective  - Dulera 200 started 11/09/10 >> much better December 21, 2010  Hypertension  Hives 07-2007  Allergic rhinitis  - Sinus ct 01/2009 wnl  Goiter 2009 > resected 12/29/08  Health Maintenance......................................[facility] [place]  - Pneumovax September 15, 2009  - 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  HEENT:   No headaches,  Difficulty swallowing,  Tooth/dental problems, or  Sore throat,                No sneezing, itching, ear ache,  +nasal congestion, post nasal drip,   CV:  No chest pain,  Orthopnea, PND, swelling in lower extremities, anasarca, dizziness, palpitations, syncope.   GI  No heartburn, indigestion, abdominal pain, nausea, vomiting, diarrhea, change in bowel habits, loss of appetite, bloody stools.   Resp:   No change in color of mucus.  No wheezing.  No chest wall deformity  Skin: no rash or lesions.  GU: no dysuria, change in color of urine, no urgency or frequency.  No flank pain, no hematuria   MS:  No joint pain or swelling.  No decreased range of motion.  No back pain.  Psych:  No change in mood or affect. No depression or anxiety.  No memory loss.          Objective:   Physical Exam  ambulatory wf nad moderately cushingnoid  wt 144 April 25, 2009 > 11/19/2011 124 > 03/07/2012  131>  07/07/2012  136 > 07/25/2012   134> 08/06/2012 134 08/06/2012 > 138 12/02/2012 >138  HEENT:edentullous with dentures in place nl turbinates,   Nl external ear canals without cough reflex  NECK : without JVD/Nodes/TM/ nl carotid upstrokes bilaterally. LUNGS: barrel chest, distant BS w/ no wheezing  CV: RRR no s3 or murmur or increase in P2, no edema  ABD: soft and nontender with nl excursion in the supine position. No bruits or organomegaly, bowel sounds nl  MS: warm without deformities, calf tenderness, cyanosis or clubbing    Assessment:         Plan:

## 2013-03-02 NOTE — Patient Instructions (Addendum)
Follow med calendar closely and bring to each visit.  I will call with xray results.  Follow up with Dr. Sherene Sires  In 4 months

## 2013-03-02 NOTE — Assessment & Plan Note (Addendum)
Compensated on present regimen  Patient's medications were reviewed today and patient education was given. Computerized medication calendar was adjusted/completed >check cxr f/u copd   Plan  Follow med calendar closely and bring to each visit.  I will call with xray results.  Follow up with Dr. Sherene Sires  In 4 months

## 2013-03-03 DIAGNOSIS — B0222 Postherpetic trigeminal neuralgia: Secondary | ICD-10-CM

## 2013-03-03 DIAGNOSIS — IMO0001 Reserved for inherently not codable concepts without codable children: Secondary | ICD-10-CM

## 2013-03-03 DIAGNOSIS — N390 Urinary tract infection, site not specified: Secondary | ICD-10-CM

## 2013-03-03 DIAGNOSIS — B029 Zoster without complications: Secondary | ICD-10-CM

## 2013-03-05 ENCOUNTER — Encounter: Payer: Self-pay | Admitting: Adult Health

## 2013-03-05 ENCOUNTER — Other Ambulatory Visit: Payer: Self-pay | Admitting: Adult Health

## 2013-03-05 DIAGNOSIS — R911 Solitary pulmonary nodule: Secondary | ICD-10-CM | POA: Insufficient documentation

## 2013-03-05 NOTE — Addendum Note (Signed)
Addended by: Boone Master E on: 03/05/2013 04:29 PM   Modules accepted: Orders

## 2013-03-05 NOTE — Addendum Note (Signed)
Addended by: Boone Master E on: 03/05/2013 03:35 PM   Modules accepted: Orders

## 2013-03-06 ENCOUNTER — Ambulatory Visit (INDEPENDENT_AMBULATORY_CARE_PROVIDER_SITE_OTHER)
Admission: RE | Admit: 2013-03-06 | Discharge: 2013-03-06 | Disposition: A | Discharge status: Home / Self Care | Payer: Medicare Other | Source: Ambulatory Visit | Attending: Adult Health | Admitting: Adult Health

## 2013-03-06 ENCOUNTER — Telehealth: Payer: Self-pay | Admitting: Adult Health

## 2013-03-06 DIAGNOSIS — R911 Solitary pulmonary nodule: Secondary | ICD-10-CM

## 2013-03-06 NOTE — Telephone Encounter (Signed)
I took call report from Dr. Ruffin Frederick for pt [name]. He states there is a 1 cm spiculated nodule suspicious for malignancy and another RLL nodule 7.44mm. He is rec PET scan and then biopsy. I advised Shanda Bumps of this and she will contact TP. Carron Curie, CMA

## 2013-03-06 NOTE — Telephone Encounter (Signed)
Spoke with TP She will call patient Ov scheduled w/ MW [phone] at 10am for results review  Will sign off

## 2013-03-10 ENCOUNTER — Other Ambulatory Visit: Payer: Medicare Other

## 2013-03-10 ENCOUNTER — Encounter: Payer: Self-pay | Admitting: Adult Health

## 2013-03-10 ENCOUNTER — Encounter: Payer: Self-pay | Admitting: Internal Medicine

## 2013-03-10 ENCOUNTER — Ambulatory Visit (INDEPENDENT_AMBULATORY_CARE_PROVIDER_SITE_OTHER): Payer: Medicare Other | Admitting: Internal Medicine

## 2013-03-10 VITALS — BP 128/86 | HR 85 | Temp 98.5°F | Ht 61.5 in | Wt 138.8 lb

## 2013-03-10 DIAGNOSIS — J449 Chronic obstructive pulmonary disease, unspecified: Secondary | ICD-10-CM

## 2013-03-10 DIAGNOSIS — R911 Solitary pulmonary nodule: Secondary | ICD-10-CM

## 2013-03-10 DIAGNOSIS — J4489 Other specified chronic obstructive pulmonary disease: Secondary | ICD-10-CM

## 2013-03-10 NOTE — Assessment & Plan Note (Signed)
-   PFT's 12/07/08 FEV1 1.26(61%) ratio 42, 5% resp to B2  - PFT's 02/03/09 FEV1 .88 (38%) ratio 40  - PFT's 02/08/10 FEV1 1.30 (64%) ratio 45, 19% resp to B2 and DLC046% corrects to 63%  - PFT's 04/30/2011        1.28(64% ratio 46, no better b2  dlco  50%  - HFA technique 75% Mar 04, 2009 > 75% October 05, 2010> 90% November 09, 2010  - 02 dep at hs since April 2010, no desat walking October 05, 2010  - Prednsone maint since  April 25, 2009  - Add on Qvar 80 December 28, 2009 > d/c November 09, 2010 as not effective  - Dulera 200 started 11/09/10 >> much better December 21, 2010   She is only GOLD II by previous pft's but very symptomatic and still steroid dependent so clearly not a good candidate for aggressive eval of bilateral lung nodules (discussed separately) .  The goal with a chronic steroid dependent illness is always arriving at the lowest effective dose that controls the disease/symptoms and not accepting a set "formula" which is based on statistics or guidelines that don't always take into account patient  variability or the natural hx of the dz in every individual patient, which may well vary over time.  For now therefore I recommend the patient maintain  A floor of 5 mg daily     Each maintenance medication was reviewed in detail including most importantly the difference between maintenance and as needed and under what circumstances the prns are to be used. This was done in the context of a medication calendar review which provided the patient with a user-friendly unambiguous mechanism for medication administration and reconciliation and provides an action plan for all active problems. It is critical that this be shown to every doctor  for modification during the office visit if necessary so the patient can use it as a working document.

## 2013-03-10 NOTE — Assessment & Plan Note (Signed)
03/03/2013 cxr 10 mm nodule within the left midlung >CT chest 03/06/2013 spiculated nodule in the left lower lobe laterally measures about 1 cm. Small spiculated nodule in right lower lobe medially measures 7.5 mm. . No adenopathy. . Low density nodules are noted thyroid gland.  Given that she has bilateral suspicious but very tiny lung nodules there is no easy option here, esp given how symptomatic she is from pulmonary perspective  Discussed in detail all the  indications, usual  risks and alternatives  relative to the benefits with patient and husband who agree  to proceed with conservative approach with cxr and pft's in 3 months

## 2013-03-10 NOTE — Patient Instructions (Addendum)
See calendar for specific medication instructions and bring it back for each and every office visit for every healthcare provider you see.  Without it,  you may not receive the best quality medical care that we feel you deserve.  You will note that the calendar groups together  your maintenance  medications that are timed at particular times of the day.  Think of this as your checklist for what your doctor has instructed you to do until your next evaluation to see what benefit  there is  to staying on a consistent group of medications intended to keep you well.  The other group at the bottom is entirely up to you to use as you see fit  for specific symptoms that may arise between visits that require you to treat them on an as needed basis.  Think of this as your action plan or "what if" list.   Separating the top medications from the bottom group is fundamental to providing you adequate care going forward.    Please schedule a follow up visit in 3 months but call sooner if needed with cxr and pfts

## 2013-03-10 NOTE — Progress Notes (Signed)
Subjective:     Patient ID: [name], female   DOB: Jun 07, 1946    MRN: [id number]  HPI  46 yowf who quit smoking in December 2007, with copd with minimum asthmatic component  baseline = grocery store ok, tends to avoid mall or superstores and does use [facility] parking/ steroid dep since July 2010   October 05, 2010 ov co worse doe despite pred 20 and no longer benefit from saba, no assoc cough or noct/early am symptoms. .rec For stress try Valium 5 mg every 6 hours if needed  See calendar/ get bone density since can't get off prednsione.  November 09, 2010 ov sob no better, no cough.  rec start dulera and taper prednisone to a floor of 5 mg and as needed valium   December 21, 2010 ov better , tapered to 10 mg per day. no cough, breathing better with less mouth irritation on dulera. using valium no more than one daily.  rec 1) Try to reduce prednisone to 10 mg one half daily as your floor with option to back to 20 mg per day if worsen   08/01/2011 Follow up and med review/NP.  [name]. She says she is doing well with no flare in cough or wheezing. Dyspnea is at baseline w/ no increased SABA use. Last OV with no sign. decline in FEV1.  We reviewed all her meds and updated her med calendar with pt education.  She has decreased her prednisone to 5mg   Every other day. Wants to see if she can taper off, feels so better on lower doses and has been able to loose weight on lower steroid doses rec Follow med calendar closely and bring to each visit.  May try to taper off prednisone over next month, begin to take 5mg  1/2  Every other day for 2 weeks  Then 1/2 twice weekly for 2 weeks and then stop.   10/01/2011 f/u ov/[name] much worse off prednsione so now back  on prednisone 5 mg two each am breathing better and no need for saba. Generalized weakness/ anorexia also while off prednisone.   Imp steroid dep/ adrenal insuff. rec Prednisone 5 mg new ceiling is 2 daily until  better then maintain on 1 daily until return    11/19/2011 f/u ov/[name] cc Shortness of Breath Pt states breathing doing well- tapered herself to 5 mg prednisone daily -  denies cough at present time.  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  07/07/2012 f/u ov/[name] following med calendar well cc worse cough/congestion x one week s sign sob, esp 1st thing in am on prednisone 5mg  one half daily,  On proair hfa  every other day never neb. Wears 02 at hs but usually it's off when she wakes up and ? Whether really needs it.     rec  schedule overnight oxygens sats on room air  symbicort 160 is dulera equivalent > use Take 2 puffs first thing in am and then another 2 puffs about 12 hours later and if it's cheaper ok  with me to change   12/02/2012 f/u ov/[name] cc  breathing is doing ok-- x1 week has been coughing w clear mucus and sneezing---denies nay other concerns, following the action plan well down to prednisone 5 mg one half daily and no need to increase since last ov.  Confuse a bit with substitution of generics on the maintenance list.   >no changes       03/10/2013 f/u ov/[name] 02 at hs/ baseline sob x [employer]/ at floor dose of prednisone 5 mg daily   Chief Complaint  Patient presents with  . Follow-up    follow up to discuss CT Chest results.  Breathing unchanged.     No obvious daytime variabilty or assoc chronic cough or cp or chest tightness, subjective wheeze overt sinus or hb symptoms. No unusual exp hx or h/o childhood pna/ asthma or premature birth to her knowledge.   Sleeping ok without  nocturnal  or early am exacerbation  of respiratory  c/o's or need for noct saba. Also denies any obvious fluctuation of symptoms with weather or environmental changes or other aggravating or alleviating factors except as outlined above   Current Medications, Allergies, Past Medical History, Past Surgical History, Family History, and Social History were reviewed in [employer] record.  ROS  The following are not active complaints unless bolded sore throat, dysphagia, dental problems, itching, sneezing,  nasal congestion or excess/ purulent secretions, ear ache,   fever, chills, sweats, unintended wt loss, pleuritic or exertional cp, hemoptysis,  orthopnea pnd or leg swelling, presyncope, palpitations, heartburn, abdominal pain, anorexia, nausea, vomiting, diarrhea  or change in bowel or urinary habits, change in stools or urine, dysuria,hematuria,  rash, arthralgias, visual complaints, headache, numbness weakness or ataxia or problems with walking or coordination,  change in mood/affect or memory.           Past Medical History:  COPD  - PFT's 12/07/08 FEV1 1.26(61%) ratio 42, 5% resp to B2  - PFT's 02/03/09 FEV1 .88 (38%) ratio 40  - PFT's 02/08/10 FEV1 1.30 (64%) ratio 45, 19% resp to B2 and DLC046% corrects to 63%  - PFT's 04/30/2011        1.28(64%) ratio 46, no better b2  dlco  50%  - HFA technique 75% Mar 04, 2009 > 75% October 05, 2010> 90% November 09, 2010  - 02 dep at hs since April 2010, no desat walking October 05, 2010  - Prednsone maint April 25, 2009  - Add on Qvar 80 December 28, 2009 > d/c November 09, 2010 as not effective  - Dulera 200 started 11/09/10 >> much better December 21, 2010  Hypertension  Hives 07-2007  Allergic rhinitis  - Sinus ct 01/2009 wnl  Goiter 2009 > resected 12/29/08  Health Maintenance......................................[facility] [place]  - Pneumovax September 15, 2009  - 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 LUNGS: barrel chest, distant BS w/ no wheezing  CV: RRR no s3 or murmur or increase in P2, no edema  ABD: soft and nontender with nl excursion in the supine position. No bruits or organomegaly, bowel sounds nl  MS: warm without deformities, calf tenderness, cyanosis or clubbing    Assessment:         Plan:

## 2013-04-04 ENCOUNTER — Ambulatory Visit (INDEPENDENT_AMBULATORY_CARE_PROVIDER_SITE_OTHER): Payer: Medicare Other | Admitting: Emergency Medicine

## 2013-04-04 VITALS — BP 153/91 | HR 104 | Temp 98.6°F | Resp 20 | Ht 61.0 in | Wt 138.4 lb

## 2013-04-04 DIAGNOSIS — Z23 Encounter for immunization: Secondary | ICD-10-CM

## 2013-04-04 DIAGNOSIS — IMO0002 Reserved for concepts with insufficient information to code with codable children: Secondary | ICD-10-CM

## 2013-04-04 DIAGNOSIS — W5501XA Bitten by cat, initial encounter: Secondary | ICD-10-CM

## 2013-04-04 DIAGNOSIS — T148XXA Other injury of unspecified body region, initial encounter: Secondary | ICD-10-CM

## 2013-04-04 MED ORDER — AMOXICILLIN-POT CLAVULANATE 875-125 MG PO TABS: 1.0000 | ORAL_TABLET | Freq: Two times a day (BID) | ORAL | Status: DC

## 2013-04-04 NOTE — Progress Notes (Signed)
[facility] [address], [place] [address] [phone]  Date:  04/04/2013   Name:  [name]   DOB:  1945/10/27   MRN:  [id number]  PCP:  Sonda Primes, MD    Chief Complaint: Animal Bite   History of Present Illness:  [name] is a 67 y.o. very pleasant female patient who presents with the following:  Bitten by a pet indoor cat on Thursday.  She has progressively increasing swelling and tenderness in her left arm.  Draining pus.  No fever or chills.   Cat is up to date on rabies, patient is not up to date on tetanus.  No improvement with over the counter medications or other home remedies. Denies other complaint or health concern today.   Patient Active Problem List   Diagnosis Date Noted  . Lung nodule 03/05/2013  . Rash 09/15/2012  . Osteoporosis/osteopenia increased risk 09/15/2012  . Bronchitis, acute 12/10/2011  . Tremor due to multiple drugs 10/05/2011  . Nausea & vomiting 04/03/2011  . CATARACTS, BILATERAL 10/05/2010  . RESPIRATORY FAILURE, CHRONIC 10/05/2010  . GOITER, NONTOXIC MULTINODULAR 10/20/2008  . VITAMIN D DEFICIENCY 06/10/2008  . PARESTHESIA 05/12/2008  . ALLERGIC RHINITIS 02/26/2008  . URTICARIA, IDIOPATHIC 07/24/2007  . MUSCLE SPASM 07/11/2007  . HYPERTENSION 07/07/2007  . COPD 07/07/2007    Past Medical History  Diagnosis Date  . COPD (chronic obstructive pulmonary disease)   . Anxiety   . Hypertension   . Allergic rhinitis   . Goiter     Past Surgical History  Procedure Laterality Date  . Tubal ligation    . Tonsillectomy    . Wisdom tooth extraction    . Thyroidectomy  12/29/08    History  Substance Use Topics  . Smoking status: Former Smoker -- 1.00 packs/day for 40 years    Types: Cigarettes    Quit date: 04/01/2005  . Smokeless tobacco: Never Used  . Alcohol Use: No     Comment: rare    Family History  Problem Relation Age of Onset  . Hypertension Father     No Known  Allergies  Medication list has been reviewed and updated.  Current Outpatient Prescriptions on File Prior to Visit  Medication Sig Dispense Refill  . albuterol (PROVENTIL) (2.5 MG/3ML) 0.083% nebulizer solution 1 vial in neb every 4-6 hours as needed (PLAN C)      . Calcium Carbonate-Vitamin D (CALCIUM-VITAMIN D) 500-200 MG-UNIT per tablet Take 1 tablet by mouth 2 (two) times daily with a meal.      . cetirizine (ZYRTEC) 10 MG tablet Take 1 at bedtime as needed      . cholecalciferol (VITAMIN D) 1000 UNITS tablet Take 1,000 Units by mouth daily.        . cyclobenzaprine (FLEXERIL) 5 MG tablet Take 5 mg by mouth 2 (two) times daily as needed.      Marland Kitchen dextromethorphan-guaiFENesin (MUCINEX DM) 30-600 MG per 12 hr tablet Take 1-2 tablets by mouth every 12 (twelve) hours as needed.       . diazepam (VALIUM) 5 MG tablet TAKE 1 TABLET BY MOUTH EVERY 6 HOURS AS NEEDED  120 tablet  0  . famotidine (PEPCID) 20 MG tablet Add 1 at bedtime until cough is gone      . NON FORMULARY Oxygen  1.5 L at bedtime and as needed for increased shortness of breath w/ activity      . omeprazole (PRILOSEC) 40 MG capsule Take 40  mg by mouth daily.       . predniSONE (DELTASONE) 5 MG tablet 1/2 tab by mouth daily or taper as directed on med calendar      . PROAIR HFA 108 (90 BASE) MCG/ACT inhaler INHALE 2 PUFFS BY MOUTH EVERY 6 HOURS AS NEEDED FOR WHEEZING  8.5 each  0  . tiotropium (SPIRIVA HANDIHALER) 18 MCG inhalation capsule Place 1 capsule (18 mcg total) into inhaler and inhale daily. (PLAN A)  30 capsule  11  . amLODipine (NORVASC) 5 MG tablet Take 1 tablet (5 mg total) by mouth daily.  90 tablet  3  . losartan (COZAAR) 100 MG tablet 1/2 tablet daily      . mometasone-formoterol (DULERA) 200-5 MCG/ACT AERO Inhale 2 puffs into the lungs 2 (two) times daily. (((PLAN A)))      . traMADol (ULTRAM) 50 MG tablet TAKE 1-2 TABLETS (50-100 MG TOTAL) BY MOUTH 2 (TWO) TIMES DAILY AS NEEDED FOR PAIN.  100 tablet  0   No current  facility-administered medications on file prior to visit.    Review of Systems:  As per HPI, otherwise negative.    Physical Examination: Filed Vitals:   04/04/13 1642  BP: 153/91  Pulse: 104  Temp: 98.6 F (37 C)  Resp: 20   Filed Vitals:   04/04/13 1642  Height: 5\' 1"  (1.549 m)  Weight: 138 lb 6.4 oz (62.778 kg)   Body mass index is 26.16 kg/(m^2). Ideal Body Weight: Weight in (lb) to have BMI = 25: 132   GEN: WDWN, NAD, Non-toxic, Alert & Oriented x 3 HEENT: Atraumatic, Normocephalic.  Ears and Nose: No external deformity. EXTR: No clubbing/cyanosis/edema NEURO: Normal gait.  PSYCH: Normally interactive. Conversant. Not depressed or anxious appearing.  Calm demeanor.  SKIN:  Erythema and swelling dorsal forearm.  NATI  No drainage  Assessment and Plan: Cat bite  Cellulitis arm TD augmentin   Signed,  Phillips Odor, MD

## 2013-04-04 NOTE — Patient Instructions (Addendum)

## 2013-04-07 ENCOUNTER — Ambulatory Visit (INDEPENDENT_AMBULATORY_CARE_PROVIDER_SITE_OTHER): Payer: Medicare Other | Admitting: Internal Medicine

## 2013-04-07 ENCOUNTER — Encounter: Payer: Self-pay | Admitting: Internal Medicine

## 2013-04-07 VITALS — BP 138/82 | HR 75 | Temp 98.8°F

## 2013-04-07 DIAGNOSIS — T148 Other injury of unspecified body region: Secondary | ICD-10-CM

## 2013-04-07 DIAGNOSIS — T148XXA Other injury of unspecified body region, initial encounter: Secondary | ICD-10-CM

## 2013-04-07 DIAGNOSIS — IMO0002 Reserved for concepts with insufficient information to code with codable children: Secondary | ICD-10-CM

## 2013-04-07 MED ORDER — METHYLPREDNISOLONE ACETATE 80 MG/ML IJ SUSP
80.0000 mg | Freq: Once | INTRAMUSCULAR | Status: AC
  Administered 2013-04-07: 80 mg via INTRAMUSCULAR

## 2013-04-07 NOTE — Progress Notes (Signed)
Subjective:    Patient ID: [name], female    DOB: [birth year], 67 y.o.   MRN: [id number]  HPI  Pt presents to the clinic today with c/o a cat bite on her left forearm. This occured Saturday. She went to [facility] for the same. They gave her a tetanus booster and put her on Augmentin. The redness has decreased some but not the swelling. She has been able to express pus out of the wound. She was bitten by her cat who is UTD on all vaccines. She is wondering if she should continue to clean the wound with soap and water and put neosporin on it.  Review of Systems      Past Medical History  Diagnosis Date  . COPD (chronic obstructive pulmonary disease)   . Anxiety   . Hypertension   . Allergic rhinitis   . Goiter     Current Outpatient Prescriptions  Medication Sig Dispense Refill  . albuterol (PROVENTIL) (2.5 MG/3ML) 0.083% nebulizer solution 1 vial in neb every 4-6 hours as needed (PLAN C)      . amLODipine (NORVASC) 5 MG tablet Take 1 tablet (5 mg total) by mouth daily.  90 tablet  3  . amLODipine-olmesartan (AZOR) 5-20 MG per tablet Take 1 tablet by mouth daily.      Marland Kitchen amoxicillin-clavulanate (AUGMENTIN) 875-125 MG per tablet Take 1 tablet by mouth 2 (two) times daily.  20 tablet  0  . Calcium Carbonate-Vitamin D (CALCIUM-VITAMIN D) 500-200 MG-UNIT per tablet Take 1 tablet by mouth 2 (two) times daily with a meal.      . cetirizine (ZYRTEC) 10 MG tablet Take 1 at bedtime as needed      . cholecalciferol (VITAMIN D) 1000 UNITS tablet Take 1,000 Units by mouth daily.        . cyclobenzaprine (FLEXERIL) 5 MG tablet Take 5 mg by mouth 2 (two) times daily as needed.      Marland Kitchen dextromethorphan-guaiFENesin (MUCINEX DM) 30-600 MG per 12 hr tablet Take 1-2 tablets by mouth every 12 (twelve) hours as needed.       . diazepam (VALIUM) 5 MG tablet TAKE 1 TABLET BY MOUTH EVERY 6 HOURS AS NEEDED  120 tablet  0  . famotidine (PEPCID) 20 MG tablet Add 1 at bedtime until cough is gone      . losartan  (COZAAR) 100 MG tablet 1/2 tablet daily      . mometasone-formoterol (DULERA) 200-5 MCG/ACT AERO Inhale 2 puffs into the lungs 2 (two) times daily. (((PLAN A)))      . NON FORMULARY Oxygen  1.5 L at bedtime and as needed for increased shortness of breath w/ activity      . omeprazole (PRILOSEC) 40 MG capsule Take 40 mg by mouth daily.       . predniSONE (DELTASONE) 5 MG tablet 1/2 tab by mouth daily or taper as directed on med calendar      . PROAIR HFA 108 (90 BASE) MCG/ACT inhaler INHALE 2 PUFFS BY MOUTH EVERY 6 HOURS AS NEEDED FOR WHEEZING  8.5 each  0  . tiotropium (SPIRIVA HANDIHALER) 18 MCG inhalation capsule Place 1 capsule (18 mcg total) into inhaler and inhale daily. (PLAN A)  30 capsule  11  . traMADol (ULTRAM) 50 MG tablet TAKE 1-2 TABLETS (50-100 MG TOTAL) BY MOUTH 2 (TWO) TIMES DAILY AS NEEDED FOR PAIN.  100 tablet  0   No current facility-administered medications for this visit.    No Known Allergies  Family History  Problem Relation Age of Onset  . Hypertension Father     History   Social History  . Marital Status: Married    Spouse Name: N/A    Number of Children: N/A  . Years of Education: N/A   Occupational History  . cashier     [employer]   Social History Main Topics  . Smoking status: Former Smoker -- 1.00 packs/day for 40 years    Types: Cigarettes    Quit date: 04/01/2005  . Smokeless tobacco: Never Used  . Alcohol Use: No     Comment: rare  . Drug Use: No  . Sexually Active: Yes   Other Topics Concern  . Not on file   Social History Narrative  . No narrative on file     Constitutional: Denies fever, malaise, fatigue, headache or abrupt weight changes. .  Skin: Pt reports cat bite to left forearm.  .   No other specific complaints in a complete review of systems (except as listed in HPI above).  Objective:   Physical Exam  BP 138/82  Pulse 75  Temp(Src) 98.8 F (37.1 C) (Oral)  SpO2 92% Wt Readings from Last 3 Encounters:  04/04/13 138  lb 6.4 oz (62.778 kg)  03/10/13 138 lb 12.8 oz (62.959 kg)  03/02/13 138 lb 3.2 oz (62.687 kg)    General: Appears her stated age, well developed, well nourished in NAD. Skin: 2 small puncture sites on left forearm. Lateral one, scabbed with evidence of healing. Medial one, inflamed with surrounding cellulitis and possible abscess formation. Cardiovascular: Normal rate and rhythm. S1,S2 noted.  No murmur, rubs or gallops noted. No JVD or BLE edema. No carotid bruits noted. Pulmonary/Chest: Normal effort and positive vesicular breath sounds. No respiratory distress. No wheezes, rales or ronchi noted.    BMET    Component Value Date/Time   NA 137 01/05/2013 1442   K 4.0 01/05/2013 1442   CL 104 01/05/2013 1442   CO2 23 01/05/2013 1442   GLUCOSE 113* 01/05/2013 1442   BUN 19 01/05/2013 1442   CREATININE 1.1 01/05/2013 1442   CALCIUM 9.9 01/05/2013 1442   GFRNONAA 30.22 05/16/2010 0907   GFRAA  Value: >60        The eGFR has been calculated using the MDRD equation. This calculation has not been validated in all clinical situations. eGFR's persistently <60 mL/min signify possible Chronic Kidney Disease. 02/01/2009 0327    Lipid Panel     Component Value Date/Time   CHOL 226* 05/16/2010 0907   TRIG 286.0* 05/16/2010 0907   HDL 73.10 05/16/2010 0907   CHOLHDL 3 05/16/2010 0907   VLDL 57.2* 05/16/2010 0907    CBC    Component Value Date/Time   WBC 11.3* 01/05/2013 1442   RBC 4.16 01/05/2013 1442   HGB 13.4 01/05/2013 1442   HCT 39.6 01/05/2013 1442   PLT 300.0 01/05/2013 1442   MCV 95.2 01/05/2013 1442   MCHC 33.9 01/05/2013 1442   RDW 12.9 01/05/2013 1442   LYMPHSABS 0.8 01/05/2013 1442   MONOABS 0.9 01/05/2013 1442   EOSABS 0.1 01/05/2013 1442   BASOSABS 0.0 01/05/2013 1442    Hgb A1C Lab Results  Component Value Date   HGBA1C 5.4 01/05/2013         Assessment & Plan:   Puncture of left forearm secondary to cat bate with subsequent cellulitis and abscess.  Continue Augmentin Will give 80  mg Depo IM today Wash area with soap and water  Warm compresses TID to medial wound- hopefully it will drain  RTC in redness or warmth extends or you notice increase drainage of pus from the area

## 2013-04-07 NOTE — Patient Instructions (Signed)

## 2013-04-17 ENCOUNTER — Other Ambulatory Visit: Payer: Self-pay | Admitting: Internal Medicine

## 2013-04-22 MED ORDER — DIAZEPAM 5 MG PO TABS: 5.0000 mg | ORAL_TABLET | Freq: Four times a day (QID) | ORAL | Status: DC | PRN

## 2013-04-22 NOTE — Telephone Encounter (Signed)
Do you want to refill the xanax for her, or defer to PCP?  Please advise thanks

## 2013-04-22 NOTE — Addendum Note (Signed)
Addended by: Caryl Ada on: 04/22/2013 11:45 AM   Modules accepted: Orders

## 2013-05-01 ENCOUNTER — Telehealth: Payer: Self-pay | Admitting: *Deleted

## 2013-05-01 NOTE — Telephone Encounter (Signed)
Per OV 03/10/13 Please schedule a follow up visit in 3 months but call sooner if needed with cxr and pfts ---  Pt is aware she is needing CXR at that OV. Nothing further was needed

## 2013-05-18 ENCOUNTER — Ambulatory Visit (INDEPENDENT_AMBULATORY_CARE_PROVIDER_SITE_OTHER): Payer: Medicare Other | Admitting: Internal Medicine

## 2013-05-18 ENCOUNTER — Telehealth: Payer: Self-pay | Admitting: Internal Medicine

## 2013-05-18 ENCOUNTER — Encounter: Payer: Self-pay | Admitting: Internal Medicine

## 2013-05-18 VITALS — BP 132/82 | HR 107 | Temp 98.7°F | Wt 137.8 lb

## 2013-05-18 DIAGNOSIS — K5900 Constipation, unspecified: Secondary | ICD-10-CM

## 2013-05-18 NOTE — Patient Instructions (Signed)

## 2013-05-18 NOTE — Telephone Encounter (Signed)
Patient Information:  Caller Name: [facility]  Phone: [phone]  Patient: [name]  Gender: Female  DOB: January 22, 1946  Age: 67 Years  PCP: Plotnikov, Alex (Adults only)  Office Follow Up:  Does the office need to follow up with this patient?: No  Instructions For The Office: N/A  RN Note:  No nausea or vomiting.  Denies abdominal pain but has intermittent cramps and passing gas. Stopped eating solids except prunes and crackers 05/17/13. Ate "a lot of cheese" over the weekend.    Symptoms  Reason For Call & Symptoms: Constipation with urge to deficate.  Reviewed Health History In EMR: Yes  Reviewed Medications In EMR: Yes  Reviewed Allergies In EMR: Yes  Reviewed Surgeries / Procedures: Yes  Date of Onset of Symptoms: 05/15/2013  Treatments Tried: Woman's laxatives, [employer] stool softners  Treatments Tried Worked: No  Guideline(s) Used:  Constipation  Disposition Per Guideline:   See Today in Office  Reason For Disposition Reached:   Abdomen is more swollen than usual  Advice Given:  General Constipation Instructions:  Eat a high fiber diet.  Drink adequate liquids.  Avoid enemas and stimulant laxatives.  High Fiber Diet:  Try to eat fresh fruit and vegetables at each meal (peas, prunes, citrus, apples, beans, corn).  Eat more grain foods (bran flakes, bran muffins, graham crackers, oatmeal, brown rice, and whole wheat bread). Popcorn is a source of fiber.  Liquids:  Drink 6-8 glasses of water a day (Caution: certain medical conditions require fluid restriction).  Prune juice is a natural laxative.  Avoid alcohol.  Bulk Laxatives:  Metamucil (psyllium fiber): One teaspoon (5 cc) in a glass of water twice daily.  Bulk-forming agents work like fiber to help soften the stools and improve your intestinal function. Long-term use of this type of laxative is generally safe.  Side Effects: Mild gas or a bloating sensation may occur.  Osmotic Laxatives:  Miralax  (polyethylene glycol 3350): Miralax is an "osmotic" agent which means that it binds water and causes water to be retained within the stool. You can use this laxative to treat occasional constipation. Do not use for more than 2 weeks without approval from your doctor. Generally, Miralax produces a bowel movement in 1 to 3 days. Side effects include diarrhea (especially at higher doses). If you are pregnant, discuss with your doctor before using. Available in the [place].  Enemas:  Should be used rarely and only after other measures have not worked.  Call Back If:  Constipation continues (i.e., less than 3 BMs / week or straining more than 25% of the time) after following care advice for constipation for 2 weeks  You become worse  Patient Will Follow Care Advice:  YES  Appointment Scheduled:  05/18/2013 15:45:00 Appointment Scheduled Provider:  Nicki Reaper

## 2013-05-18 NOTE — Progress Notes (Signed)
Subjective:    Patient ID: [name], female    DOB: November 02, 1945, 67 y.o.   MRN: [id number]  HPI  Pt presents to the clinic today with c/o constipation. This started on July 31. She has not had a BM in 5 days. She has tried a laxative, stool softener, and enema, none of which had seem to work. She has had change in her diet over the past few weeks. She is drinking water on a daily basis. She denies abdominal pain and is passing gas. She has never had any problem with constipation that she is aware of.  Review of Systems      Past Medical History  Diagnosis Date  . COPD (chronic obstructive pulmonary disease)   . Anxiety   . Hypertension   . Allergic rhinitis   . Goiter     Current Outpatient Prescriptions  Medication Sig Dispense Refill  . albuterol (PROVENTIL) (2.5 MG/3ML) 0.083% nebulizer solution 1 vial in neb every 4-6 hours as needed (PLAN C)      . amLODipine (NORVASC) 5 MG tablet Take 1 tablet (5 mg total) by mouth daily.  90 tablet  3  . amLODipine-olmesartan (AZOR) 5-20 MG per tablet Take 1 tablet by mouth daily.      . Calcium Carbonate-Vitamin D (CALCIUM-VITAMIN D) 500-200 MG-UNIT per tablet Take 1 tablet by mouth 2 (two) times daily with a meal.      . cetirizine (ZYRTEC) 10 MG tablet Take 1 at bedtime as needed      . cholecalciferol (VITAMIN D) 1000 UNITS tablet Take 1,000 Units by mouth daily.        . cyclobenzaprine (FLEXERIL) 5 MG tablet Take 5 mg by mouth 2 (two) times daily as needed.      Marland Kitchen dextromethorphan-guaiFENesin (MUCINEX DM) 30-600 MG per 12 hr tablet Take 1-2 tablets by mouth every 12 (twelve) hours as needed.       . diazepam (VALIUM) 5 MG tablet Take 1 tablet (5 mg total) by mouth every 6 (six) hours as needed for anxiety.  120 tablet  0  . famotidine (PEPCID) 20 MG tablet Add 1 at bedtime until cough is gone      . losartan (COZAAR) 100 MG tablet 1/2 tablet daily      . mometasone-formoterol (DULERA) 200-5 MCG/ACT AERO Inhale 2 puffs into the  lungs 2 (two) times daily. (((PLAN A)))      . NON FORMULARY Oxygen  1.5 L at bedtime and as needed for increased shortness of breath w/ activity      . omeprazole (PRILOSEC) 40 MG capsule Take 40 mg by mouth daily.       . predniSONE (DELTASONE) 5 MG tablet 1/2 tab by mouth daily or taper as directed on med calendar      . PROAIR HFA 108 (90 BASE) MCG/ACT inhaler INHALE 2 PUFFS BY MOUTH EVERY 6 HOURS AS NEEDED FOR WHEEZING  8.5 each  0  . tiotropium (SPIRIVA HANDIHALER) 18 MCG inhalation capsule Place 1 capsule (18 mcg total) into inhaler and inhale daily. (PLAN A)  30 capsule  11  . traMADol (ULTRAM) 50 MG tablet TAKE 1-2 TABLETS (50-100 MG TOTAL) BY MOUTH 2 (TWO) TIMES DAILY AS NEEDED FOR PAIN.  100 tablet  0   No current facility-administered medications for this visit.    No Known Allergies  Family History  Problem Relation Age of Onset  . Hypertension Father     History   Social History  .  Marital Status: Married    Spouse Name: N/A    Number of Children: N/A  . Years of Education: N/A   Occupational History  . cashier     [employer]   Social History Main Topics  . Smoking status: Former Smoker -- 1.00 packs/day for 40 years    Types: Cigarettes    Quit date: 04/01/2005  . Smokeless tobacco: Never Used  . Alcohol Use: No     Comment: rare  . Drug Use: Yes  . Sexually Active: Yes   Other Topics Concern  . Not on file   Social History Narrative  . No narrative on file     Constitutional: Denies fever, malaise, fatigue, headache or abrupt weight changes.   Gastrointestinal: Pt reports constipation. Denies abdominal pain, bloating,  diarrhea or blood in the stool.    No other specific complaints in a complete review of systems (except as listed in HPI above).  Objective:   Physical Exam   BP 132/82  Pulse 107  Temp(Src) 98.7 F (37.1 C) (Oral)  Wt 137 lb 12.8 oz (62.506 kg)  BMI 26.05 kg/m2  SpO2 93% Wt Readings from Last 3 Encounters:  05/18/13 137 lb  12.8 oz (62.506 kg)  04/04/13 138 lb 6.4 oz (62.778 kg)  03/10/13 138 lb 12.8 oz (62.959 kg)    General: Appears her stated age, well developed, well nourished in NAD. Cardiovascular: Normal rate and rhythm. S1,S2 noted.  No murmur, rubs or gallops noted. No JVD or BLE edema. No carotid bruits noted. Pulmonary/Chest: Normal effort and positive vesicular breath sounds. No respiratory distress. No wheezes, rales or ronchi noted.  Abdomen: Soft and nontender. Normal bowel sounds, no bruits noted. Slightly distended. No masses noted. Liver, spleen and kidneys non palpable. DRE revealed no stool in the rectal vault   BMET    Component Value Date/Time   NA 137 01/05/2013 1442   K 4.0 01/05/2013 1442   CL 104 01/05/2013 1442   CO2 23 01/05/2013 1442   GLUCOSE 113* 01/05/2013 1442   BUN 19 01/05/2013 1442   CREATININE 1.1 01/05/2013 1442   CALCIUM 9.9 01/05/2013 1442   GFRNONAA 30.22 05/16/2010 0907   GFRAA  Value: >60        The eGFR has been calculated using the MDRD equation. This calculation has not been validated in all clinical situations. eGFR's persistently <60 mL/min signify possible Chronic Kidney Disease. 02/01/2009 0327    Lipid Panel     Component Value Date/Time   CHOL 226* 05/16/2010 0907   TRIG 286.0* 05/16/2010 0907   HDL 73.10 05/16/2010 0907   CHOLHDL 3 05/16/2010 0907   VLDL 57.2* 05/16/2010 0907    CBC    Component Value Date/Time   WBC 11.3* 01/05/2013 1442   RBC 4.16 01/05/2013 1442   HGB 13.4 01/05/2013 1442   HCT 39.6 01/05/2013 1442   PLT 300.0 01/05/2013 1442   MCV 95.2 01/05/2013 1442   MCHC 33.9 01/05/2013 1442   RDW 12.9 01/05/2013 1442   LYMPHSABS 0.8 01/05/2013 1442   MONOABS 0.9 01/05/2013 1442   EOSABS 0.1 01/05/2013 1442   BASOSABS 0.0 01/05/2013 1442    Hgb A1C Lab Results  Component Value Date   HGBA1C 5.4 01/05/2013        Assessment & Plan:   Constipation,  new onset:  Pt instructed to go home and now try a fleets enema, if no result, repeat x 1 Colace  and Miralax for the next 2 days  If no BM by WED, RTC  RTC as needed or if pain persist or worsens or if you do not have a BM with the fleets enema

## 2013-06-08 ENCOUNTER — Ambulatory Visit (INDEPENDENT_AMBULATORY_CARE_PROVIDER_SITE_OTHER): Payer: Medicare Other | Admitting: Internal Medicine

## 2013-06-08 ENCOUNTER — Ambulatory Visit: Payer: Medicare Other | Admitting: Internal Medicine

## 2013-06-08 ENCOUNTER — Ambulatory Visit (INDEPENDENT_AMBULATORY_CARE_PROVIDER_SITE_OTHER)
Admission: RE | Admit: 2013-06-08 | Discharge: 2013-06-08 | Disposition: A | Discharge status: Home / Self Care | Payer: Medicare Other | Source: Ambulatory Visit | Attending: Internal Medicine | Admitting: Internal Medicine

## 2013-06-08 ENCOUNTER — Encounter: Payer: Self-pay | Admitting: Internal Medicine

## 2013-06-08 VITALS — BP 104/64 | HR 119 | Temp 98.1°F | Ht 61.0 in | Wt 132.0 lb

## 2013-06-08 DIAGNOSIS — J9610 Chronic respiratory failure, unspecified whether with hypoxia or hypercapnia: Secondary | ICD-10-CM

## 2013-06-08 DIAGNOSIS — J449 Chronic obstructive pulmonary disease, unspecified: Secondary | ICD-10-CM

## 2013-06-08 DIAGNOSIS — R911 Solitary pulmonary nodule: Secondary | ICD-10-CM

## 2013-06-08 DIAGNOSIS — I10 Essential (primary) hypertension: Secondary | ICD-10-CM

## 2013-06-08 NOTE — Progress Notes (Signed)
PFT done today. 

## 2013-06-08 NOTE — Patient Instructions (Addendum)
Please see patient coordinator before you leave today  to schedule a PET scan and I will call to discuss results  Stop losartan as your blood pressure is low > we may need to consider changing to alternative medication due to high heart rate

## 2013-06-08 NOTE — Progress Notes (Signed)
Subjective:     Patient ID: [name], female   DOB: [birth year]    MRN: [id number]    Brief patient profile:  89 yowf who quit smoking in December 2007, with copd with minimum asthmatic component  baseline = grocery store ok, tends to avoid mall or superstores and does use [facility] parking/ steroid dep since July 2010    03/03/13 CXR  SPN LLL  03/10/2013 f/u ov/Takisha Pelle 02 at hs/ baseline sob x [employer]/ at floor dose of prednisone 5 mg daily   Chief Complaint  Patient presents with  . Follow-up    follow up to discuss CT Chest results.  Breathing unchanged.    rec Follow the med calendar   06/08/2013 f/u ov/Francina Beery on pred 5 mg one daily and never need neb, avg saba sev times per day Chief Complaint  Patient presents with  . Followup with cxr and PFT    Pt states having increased SOB and cough since her last visit- relates this to hot, humid weather.  Cough is non prod and worsens when she goes out in the heat.   can still do [employer] s 02 on a good day but worse x sev weeks despite maint rx including pred at 5 mg daily assoc with worsening dry cough- rare saba hfa use    No obvious daytime variabilty or assoc cp or chest tightness, subjective wheeze overt sinus or hb symptoms. No unusual exp hx or h/o childhood pna/ asthma or premature birth to her knowledge.   Sleeping ok without nocturnal  or early am exacerbation  of respiratory  c/o's or need for noct saba. Also denies any obvious fluctuation of symptoms with weather or environmental changes or other aggravating or alleviating factors except as outlined above   Current Medications, Allergies, Past Medical History, Past Surgical History, Family History, and Social History were reviewed in [employer] record.  ROS  The following are not active complaints unless bolded sore throat, dysphagia, dental problems, itching, sneezing,  nasal congestion or excess/ purulent secretions, ear ache,   fever, chills,  sweats, unintended wt loss, pleuritic or exertional cp, hemoptysis,  orthopnea pnd or leg swelling, presyncope, palpitations, heartburn, abdominal pain, anorexia, nausea, vomiting, diarrhea  or change in bowel or urinary habits, change in stools or urine, dysuria,hematuria,  rash, arthralgias, visual complaints, headache, numbness weakness or ataxia or problems with walking or coordination,  change in mood/affect or memory.           Past Medical History:  COPD  - PFT's 12/07/08 FEV1 1.26(61%) ratio 42, 5% resp to B2  - PFT's 02/03/09 FEV1 .88 (38%) ratio 40  - PFT's 02/08/10 FEV1 1.30 (64%) ratio 45, 19% resp to B2 and DLC046% corrects to 63%  - PFT's 04/30/2011        1.28(64%) ratio 46, no better b2  dlco  50%  - HFA technique 75% Mar 04, 2009 > 75% October 05, 2010> 90% November 09, 2010  - 02 dep at hs since April 2010, no desat walking October 05, 2010  - Prednsone maint April 25, 2009  - Add on Qvar 80 December 28, 2009 > d/c November 09, 2010 as not effective  - Dulera 200 started 11/09/10 >> much better December 21, 2010  Hypertension  Hives 07-2007  Allergic rhinitis  - Sinus ct 01/2009 wnl  Goiter 2009 > resected 12/29/08  Health Maintenance......................................[facility] [place]  - Pneumovax September 15, 2009  - 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,  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 LUNGS: barrel chest, distant BS w/ no wheezing  CV: RRR no  s3 or murmur or increase in P2, no edema  ABD: soft and nontender with nl excursion in the supine position. No bruits or organomegaly, bowel sounds nl  MS: warm without deformities, calf tenderness, cyanosis or clubbing     CXR  06/08/2013 :  Mild hyperinflation. No acute infiltrate or pulmonary edema. Again noted left midlung peripheral nodule measures 1.1 cm. Further evaluation with PET scan again recommended to exclude malignancy.  Assessment:

## 2013-06-10 ENCOUNTER — Telehealth: Payer: Self-pay | Admitting: Internal Medicine

## 2013-06-10 ENCOUNTER — Encounter: Payer: Self-pay | Admitting: *Deleted

## 2013-06-10 NOTE — Assessment & Plan Note (Signed)
>  CT chest 03/06/2013 spiculated nodule in the left lower lobe laterally measures about 1 cm. Small spiculated nodule in right lower lobe medially measures 7.5 mm. . No adenopathy. . Low density nodules are noted thyroid gland.  - PET rec 06/08/2013 >>  Discussed in detail all the  indications, usual  risks and alternatives  relative to the benefits with patient who agrees to proceed with pet then regroup re whether to consider any further procedures given tenuous resp status

## 2013-06-10 NOTE — Telephone Encounter (Signed)
Fine with me but I wouldn't assume it's all nerves and strongly rec she contact her primary doctor for non pulmonary issues

## 2013-06-10 NOTE — Assessment & Plan Note (Signed)
-   02 at hs only at 1.5.pm since admit to [facility] ? 2011    - ONO ra 07/10/12 > 1 hour and 19 min with sats <88 so rec continue 02  rx = 1.5 lpm at hs and prn during the day  Adequate control on present rx, reviewed > no change in rx needed

## 2013-06-10 NOTE — Assessment & Plan Note (Addendum)
-   PFT's 04/30/2011        1.28(64% ratio 46, no better b2  dlco  50%  - PFT's 06/08/2013  FEV1  1.33 (63%) with ratio 49 and no change p saba, DLC0 49 p am spiriva, dulera - HFA technique 75% Mar 04, 2009 > 75% October 05, 2010> 90% November 09, 2010  - 02 dep at hs since April 2010, no desat walking October 05, 2010  - Prednsone maint since  April 25, 2009  - Dulera 200 started 11/09/10 >> much better December 21, 2010   Mild flare with weather conditions > reviewed self titration of pred up to a ceiling of pred 20 and down to a floor of 5 mg daily    Each maintenance medication was reviewed in detail including most importantly the difference between maintenance and as needed and under what circumstances the prns are to be used. This was done in the context of a medication calendar review which provided the patient with a user-friendly unambiguous mechanism for medication administration and reconciliation and provides an action plan for all active problems. It is critical that this be shown to every doctor  for modification during the office visit if necessary so the patient can use it as a working document.

## 2013-06-10 NOTE — Telephone Encounter (Signed)
Spoke with the pt's spouse and notified of recs per MW He verbalized understanding and will inform the pt Note faxed to the number provided which I confirmed with spouse

## 2013-06-10 NOTE — Assessment & Plan Note (Signed)
bp trends low and HR high so asked to her to hold cozar for now but consider change norvasc to cardizem or bsoprolol in future

## 2013-06-10 NOTE — Telephone Encounter (Signed)
I spoke with spouse. He stated pt has been so sick on her stomach (due to nerves) with everything going on. He stated he had made MW aware of this when pt was in to see him. She can't really keep anything down and not eating. He is wanting a letter from [facility] excusing her from work so she doesn't get fired. The fax [phone]. Please advise MW thanks

## 2013-06-12 ENCOUNTER — Encounter (HOSPITAL_COMMUNITY)
Admission: RE | Admit: 2013-06-12 | Discharge: 2013-06-12 | Disposition: A | Discharge status: Home / Self Care | Payer: Medicare Other | Source: Ambulatory Visit | Attending: Internal Medicine | Admitting: Internal Medicine

## 2013-06-12 ENCOUNTER — Encounter: Payer: Self-pay | Admitting: Internal Medicine

## 2013-06-12 ENCOUNTER — Encounter (HOSPITAL_COMMUNITY): Payer: Self-pay

## 2013-06-12 DIAGNOSIS — R911 Solitary pulmonary nodule: Secondary | ICD-10-CM | POA: Insufficient documentation

## 2013-06-12 LAB — GLUCOSE, CAPILLARY: Glucose-Capillary: 116 mg/dL — ABNORMAL HIGH (ref 70–99)

## 2013-06-12 MED ORDER — FLUDEOXYGLUCOSE F - 18 (FDG) INJECTION
19.2000 | Freq: Once | INTRAVENOUS | Status: AC | PRN
  Administered 2013-06-12: 19.2 via INTRAVENOUS

## 2013-07-08 ENCOUNTER — Other Ambulatory Visit: Payer: Self-pay | Admitting: Internal Medicine

## 2013-07-08 ENCOUNTER — Encounter: Payer: Self-pay | Admitting: Internal Medicine

## 2013-07-08 ENCOUNTER — Ambulatory Visit (INDEPENDENT_AMBULATORY_CARE_PROVIDER_SITE_OTHER): Payer: Medicare Other | Admitting: Internal Medicine

## 2013-07-08 VITALS — BP 150/100 | HR 90 | Temp 99.9°F | Wt 130.0 lb

## 2013-07-08 DIAGNOSIS — Z23 Encounter for immunization: Secondary | ICD-10-CM

## 2013-07-08 MED ORDER — VERAPAMIL HCL ER 180 MG PO CP24: 180.0000 mg | ORAL_CAPSULE | Freq: Every day | ORAL | Status: DC

## 2013-07-08 NOTE — Patient Instructions (Signed)
Gluten free trial (no wheat products) for 4-6 weeks. OK to use gluten-free bread and gluten-free pasta.  Milk free trial (no milk, ice cream, cheese and yogurt) for 4-6 weeks. OK to use almond, coconut, rice or soy milk. "Almond breeze" brand tastes good.  

## 2013-07-08 NOTE — Progress Notes (Signed)
   Subjective:    Patient ID: [name], female    DOB: 06-14-1946, 67 y.o.   MRN: [id number]  HPI  C/o rapid heart beat - Dr Sherene Sires stopped Losartan HCT. SBP 140-150 HR 90-100 The spasms in the back have resolved  The patient presents for a follow-up of  chronic hypertension elev BP at home  F/u on tremor - better, COPD, fatigue. C/o weakness, SOB. Working 4 d/wk - 20 hrs a week     BP Readings from Last 3 Encounters:  07/08/13 150/100  06/08/13 104/64  05/18/13 132/82   Wt Readings from Last 3 Encounters:  07/08/13 130 lb (58.968 kg)  06/08/13 132 lb (59.875 kg)  05/18/13 137 lb 12.8 oz (62.506 kg)                  Review of Systems  Constitutional: Positive for fatigue. Negative for activity change, appetite change and unexpected weight change.  HENT: Negative for congestion, mouth sores and sinus pressure.   Eyes: Negative for visual disturbance.  Respiratory: Negative for chest tightness.   Gastrointestinal: Negative for nausea and abdominal pain.  Genitourinary: Negative for frequency, difficulty urinating and vaginal pain.  Musculoskeletal: Negative for back pain and gait problem.  Skin: Negative for pallor.  Neurological: Positive for tremors. Negative for dizziness, weakness and numbness.  Psychiatric/Behavioral: Negative for confusion and disturbed wake/sleep cycle. The patient is nervous/anxious.        Objective:   Physical Exam  Constitutional: She appears well-developed. No distress.  HENT:  Head: Normocephalic.  Right Ear: External ear normal.  Left Ear: External ear normal.  Nose: Nose normal.  Mouth/Throat: Oropharynx is clear and moist.       Hoarse, no thrush  Eyes: Conjunctivae are normal. Pupils are equal, round, and reactive to light. Right eye exhibits no discharge. Left eye exhibits no discharge.  Neck: Normal range of motion. Neck supple. No JVD present. No tracheal deviation present. No thyromegaly present.  Cardiovascular:  Normal rate, regular rhythm and normal heart sounds.   Pulmonary/Chest: Effort normal. No stridor. No respiratory distress. She has no wheezes. She has no rales. She exhibits no tenderness.  Abdominal: Soft. Bowel sounds are normal. She exhibits no distension and no mass. There is no tenderness. There is no rebound and no guarding.  Musculoskeletal: She exhibits no edema and no tenderness.  Lymphadenopathy:    She has no cervical adenopathy.  Neurological: She displays normal reflexes. No cranial nerve deficit. She exhibits normal muscle tone. Coordination normal.       Mild tremor  Skin: Faint rash noted on her back. No erythema.  Psychiatric: She has a normal mood and affect. Her behavior is normal. Judgment and thought content normal.   Tremor is much better  Lab Results   Lab Results  Component Value Date   WBC 11.3* 01/05/2013   HGB 13.4 01/05/2013   HCT 39.6 01/05/2013   PLT 300.0 01/05/2013   GLUCOSE 113* 01/05/2013   CHOL 226* 05/16/2010   TRIG 286.0* 05/16/2010   HDL 73.10 05/16/2010   LDLDIRECT 108.0 05/16/2010   ALT 15 10/01/2011   AST 15 10/01/2011   NA 137 01/05/2013   K 4.0 01/05/2013   CL 104 01/05/2013   CREATININE 1.1 01/05/2013   BUN 19 01/05/2013   CO2 23 01/05/2013   TSH 0.42 10/01/2011   HGBA1C 5.4 01/05/2013         Assessment & Plan:

## 2013-07-09 ENCOUNTER — Encounter: Payer: Self-pay | Admitting: Internal Medicine

## 2013-07-13 ENCOUNTER — Other Ambulatory Visit: Payer: Self-pay | Admitting: Internal Medicine

## 2013-07-13 NOTE — Telephone Encounter (Signed)
Ok to refill? Last OV 9.24.14 Last filled 3.24.14

## 2013-07-17 ENCOUNTER — Other Ambulatory Visit: Payer: Self-pay | Admitting: Internal Medicine

## 2013-07-24 ENCOUNTER — Other Ambulatory Visit: Payer: Self-pay | Admitting: Internal Medicine

## 2013-08-10 ENCOUNTER — Other Ambulatory Visit: Payer: Self-pay | Admitting: Internal Medicine

## 2013-08-20 ENCOUNTER — Ambulatory Visit (INDEPENDENT_AMBULATORY_CARE_PROVIDER_SITE_OTHER): Payer: Medicare Other | Admitting: Internal Medicine

## 2013-08-20 ENCOUNTER — Ambulatory Visit (INDEPENDENT_AMBULATORY_CARE_PROVIDER_SITE_OTHER)
Admission: RE | Admit: 2013-08-20 | Discharge: 2013-08-20 | Disposition: A | Discharge status: Home / Self Care | Payer: Medicare Other | Source: Ambulatory Visit | Attending: Internal Medicine | Admitting: Internal Medicine

## 2013-08-20 ENCOUNTER — Encounter: Payer: Self-pay | Admitting: Internal Medicine

## 2013-08-20 VITALS — BP 140/88 | HR 95 | Temp 98.0°F | Ht 61.75 in | Wt 136.0 lb

## 2013-08-20 DIAGNOSIS — J449 Chronic obstructive pulmonary disease, unspecified: Secondary | ICD-10-CM

## 2013-08-20 DIAGNOSIS — J9610 Chronic respiratory failure, unspecified whether with hypoxia or hypercapnia: Secondary | ICD-10-CM

## 2013-08-20 NOTE — Patient Instructions (Signed)
Please remember to go to the  x-ray department downstairs for your tests - we will call you with the results when they are available.  Take diazepam during the day if needed for anxiety  See Tammy NP w/in 2 weeks with all your medications, even over the counter meds, separated in two separate bags, the ones you take no matter what vs the ones you stop once you feel better and take only as needed when you feel you need them.   Tammy  will generate for you a new user friendly medication calendar that will put [place] all on the same page re: your medication use.     Without this process, it simply isn't possible to assure that we are providing  your outpatient care  with  the attention to detail we feel you deserve.   If we cannot assure that you're getting that kind of care,  then we cannot manage your problem effectively from this clinic.  Once you have seen Tammy and we are sure that we're all on the same page with your medication use she will arrange follow up with me.

## 2013-08-20 NOTE — Assessment & Plan Note (Addendum)
-   PFT's 04/30/2011        1.28(64% ratio 46, no better b2  dlco  50%  - PFT's 06/08/2013  FEV1  1.33 (63%) with ratio 49 and no change p saba, DLC0 49 p am spiriva, dulera - HFA technique 75% Mar 04, 2009 > 75% October 05, 2010> 90% November 09, 2010  - 02 dep at hs since April 2010, no desat walking October 05, 2010  - Prednsone maint since  April 25, 2009  - Dulera 200 started 11/09/10 >> much better December 21, 2010   Symptoms are markedly disproportionate to objective findings and not clear this is a lung problem but pt does appear to have difficult airway management issues. DDX of  difficult airways managment all start with A and  include Adherence, Ace Inhibitors, Acid Reflux, Active Sinus Disease, Alpha 1 Antitripsin deficiency, Anxiety masquerading as Airways dz,  ABPA,  allergy(esp in young), Aspiration (esp in elderly), Adverse effects of DPI,  Active smokers, plus two Bs  = Bronchiectasis and Beta blocker use..and one C= CHF  Adherence is always the initial "prime suspect" and is a multilayered concern that requires a "trust but verify" approach in every patient - starting with knowing how to use medications, especially inhalers, correctly, keeping up with refills and understanding the fundamental difference between maintenance and prns vs those medications only taken for a very short course and then stopped and not refilled.  - needs med rec again as has lost her med calendar - The proper method of use, as well as anticipated side effects, of a metered-dose inhaler are discussed and demonstrated to the patient. Improved effectiveness after extensive coaching during this visit to a level of approximately  90%   Anxiety usually dx of exclusion but also near the top of the list > try using the diazepam prn instead of so much saba  See instructions for specific recommendations which were reviewed directly with the patient who was given a copy with highlighter outlining the key components.

## 2013-08-20 NOTE — Progress Notes (Signed)
Quick Note:  Spoke with pt and notified of results per Dr. Wert. Pt verbalized understanding and denied any questions.  ______ 

## 2013-08-20 NOTE — Progress Notes (Signed)
Subjective:     Patient ID: [name], female   DOB: 1946-08-25    MRN: [id number]    Brief patient profile:  53 yowf who quit smoking in December 2007, with copd with minimum asthmatic component  baseline = grocery store ok, tends to avoid mall or superstores and does use [facility] parking/ steroid dep since July 2010     History of Present Illness  06/08/2013 f/u ov/[name] on pred 5 mg one daily and never need neb, avg saba sev times per day Chief Complaint  Patient presents with  . Followup with cxr and PFT    Pt states having increased SOB and cough since her last visit- relates this to hot, humid weather.  Cough is non prod and worsens when she goes out in the heat.   can still do [employer] s 02 on a good day but worse x sev weeks despite maint rx including pred at 5 mg daily assoc with worsening dry cough- rare saba hfa use  rec Stop losartan as your blood pressure is low > we may need to consider changing to alternative medication due to high heart rate   08/20/2013 f/u ov/[name] re: copd/ ? Flare vs anxiety / did not bring med calendar Chief Complaint  Patient presents with  . Acute Visit    Pt c/o increased SOB and non prod cough x 1 wk. Increased pred to 20 mg approx 5 days ago with no improvement.    no proair yet on day of ov. Using lots of proair, afraid to try saba neb "put me in the hosp last time". Sob just making a bed. Using proair up to 8 x daily with min relief but not needing while sleeping   No obvious daytime variabilty or assoc cough or cp or chest tightness, subjective wheeze overt sinus or hb symptoms. No unusual exp hx or h/o childhood pna/ asthma or premature birth to her knowledge.   Sleeping ok without nocturnal  or early am exacerbation  of respiratory  c/o's or need for noct saba. Also denies any obvious fluctuation of symptoms with weather or environmental changes or other aggravating or alleviating factors except as outlined above   Current Medications,  Allergies, Past Medical History, Past Surgical History, Family History, and Social History were reviewed in [employer] record.  ROS  The following are not active complaints unless bolded sore throat, dysphagia, dental problems, itching, sneezing,  nasal congestion or excess/ purulent secretions, ear ache,   fever, chills, sweats, unintended wt loss, pleuritic or exertional cp, hemoptysis,  orthopnea pnd or leg swelling, presyncope, palpitations, heartburn, abdominal pain, anorexia, nausea, vomiting, diarrhea  or change in bowel or urinary habits, change in stools or urine, dysuria,hematuria,  rash, arthralgias, visual complaints, headache, numbness weakness or ataxia or problems with walking or coordination,  change in mood/affect or memory.           Past Medical History:  COPD  - PFT's 12/07/08 FEV1 1.26(61%) ratio 42, 5% resp to B2  - PFT's 02/03/09 FEV1 .88 (38%) ratio 40  - PFT's 02/08/10 FEV1 1.30 (64%) ratio 45, 19% resp to B2 and DLC046% corrects to 63%  - PFT's 04/30/2011        1.28(64%) ratio 46, no better b2  dlco  50%  - HFA technique 75% Mar 04, 2009 > 75% October 05, 2010> 90% November 09, 2010  - 02 dep at hs since April 2010, no desat walking October 05, 2010  -  Prednsone maint April 25, 2009  - Add on Qvar 80 December 28, 2009 > d/c November 09, 2010 as not effective  - Dulera 200 started 11/09/10 >> much better December 21, 2010  Hypertension  Hives 07-2007  Allergic rhinitis  - Sinus ct 01/2009 wnl  Goiter 2009 > resected 12/29/08  Health Maintenance......................................[facility] [place]  - Pneumovax September 15, 2009  - 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, L Fem -1.9, 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 LUNGS: barrel chest, distant BS w/ no wheezing  CV: RRR no s3 or murmur or increase in P2, no edema  ABD: soft and nontender with nl excursion in the supine position. No bruits or organomegaly, bowel sounds nl  MS: warm without deformities, calf tenderness, cyanosis or clubbing      CXR  08/20/2013 :   No edema or consolidation. Stable nodular opacity left lower lobe. Underlying emphysema.   Assessment:

## 2013-08-20 NOTE — Assessment & Plan Note (Signed)
-   02 at hs only at 1.5.pm since admit to [facility] ? 2011    - ONO ra 07/10/12 > 1 hour and 19 min with sats <88 so rec continue 02    - 08/20/2013  Walked RA x 3 laps @ 185 ft each stopped due to end of study, min sob, sats 88% at end  rx = 1.5 lpm at hs and prn during the day  Adequate control on present rx, reviewed > no change in rx needed

## 2013-08-27 ENCOUNTER — Other Ambulatory Visit: Payer: Self-pay | Admitting: Internal Medicine

## 2013-09-02 ENCOUNTER — Other Ambulatory Visit: Payer: Self-pay | Admitting: Internal Medicine

## 2013-09-04 ENCOUNTER — Encounter: Payer: Medicare Other | Admitting: Adult Health

## 2013-09-15 ENCOUNTER — Other Ambulatory Visit: Payer: Self-pay | Admitting: Internal Medicine

## 2013-09-17 ENCOUNTER — Ambulatory Visit (INDEPENDENT_AMBULATORY_CARE_PROVIDER_SITE_OTHER): Payer: Medicare Other | Admitting: Adult Health

## 2013-09-17 ENCOUNTER — Encounter: Payer: Self-pay | Admitting: Adult Health

## 2013-09-17 VITALS — BP 124/68 | HR 101 | Temp 97.7°F | Ht 61.5 in | Wt 136.0 lb

## 2013-09-17 DIAGNOSIS — J449 Chronic obstructive pulmonary disease, unspecified: Secondary | ICD-10-CM

## 2013-09-17 DIAGNOSIS — R911 Solitary pulmonary nodule: Secondary | ICD-10-CM

## 2013-09-17 NOTE — Assessment & Plan Note (Signed)
Cont to follow with serial cxr  PET neg  Repeat cxr on return in 3 months

## 2013-09-17 NOTE — Progress Notes (Signed)
Subjective:     Patient ID: [name], female   DOB: 1945-11-07    MRN: [id number]    Brief patient profile:  28 yowf who quit smoking in December 2007, with copd with minimum asthmatic component  baseline = grocery store ok, tends to avoid mall or superstores and does use [facility] parking/ steroid dep since July 2010     History of Present Illness  06/08/2013 f/u ov/Wert on pred 5 mg one daily and never need neb, avg saba sev times per day Chief Complaint  Patient presents with  . Followup with cxr and PFT    Pt states having increased SOB and cough since her last visit- relates this to hot, humid weather.  Cough is non prod and worsens when she goes out in the heat.   can still do [employer] s 02 on a good day but worse x sev weeks despite maint rx including pred at 5 mg daily assoc with worsening dry cough- rare saba hfa use  rec Stop losartan as your blood pressure is low > we may need to consider changing to alternative medication due to high heart rate   08/20/2013 f/u ov/Wert re: copd/ ? Flare vs anxiety / did not bring med calendar Chief Complaint  Patient presents with  . Acute Visit    Pt c/o increased SOB and non prod cough x 1 wk. Increased pred to 20 mg approx 5 days ago with no improvement.    no proair yet on day of ov. Using lots of proair, afraid to try saba neb "put me in the hosp last time". Sob just making a bed. Using proair up to 8 x daily with min relief but not needing while sleeping >>no changes   09/17/2013 Follow up and Med review  Returns for follow up and med review .  We reviewed all her meds and organized them into a medication calendar with pt education.  Overall breathing is at baseline. Has some dry cough, hoarseness on/off. Dust makes her worse.  Remains on prednisone 5mg  1/2 tab daily .  Patient denies any hemoptysis, orthopnea, PND, or leg swelling. Patient had chest x-ray last visit. That showed no change in her lung nodule.  Current  Medications, Allergies, Past Medical History, Past Surgical History, Family History, and Social History were reviewed in [employer] record.  ROS  The following are not active complaints unless bolded sore throat, dysphagia, dental problems, itching, sneezing,  nasal congestion or excess/ purulent secretions, ear ache,   fever, chills, sweats, unintended wt loss, pleuritic or exertional cp, hemoptysis,  orthopnea pnd or leg swelling, presyncope, palpitations, heartburn, abdominal pain, anorexia, nausea, vomiting, diarrhea  or change in bowel or urinary habits, change in stools or urine, dysuria,hematuria,  rash, arthralgias, visual complaints, headache, numbness weakness or ataxia or problems with walking or coordination,  change in mood/affect or memory.           Past Medical History:  COPD  - PFT's 12/07/08 FEV1 1.26(61%) ratio 42, 5% resp to B2  - PFT's 02/03/09 FEV1 .88 (38%) ratio 40  - PFT's 02/08/10 FEV1 1.30 (64%) ratio 45, 19% resp to B2 and DLC046% corrects to 63%  - PFT's 04/30/2011        1.28(64%) ratio 46, no better b2  dlco  50%  - HFA technique 75% Mar 04, 2009 > 75% October 05, 2010> 90% November 09, 2010  - 02 dep at hs since April 2010, no desat walking  October 05, 2010  [facility] April 25, 2009  - Add on Qvar 80 December 28, 2009 > d/c November 09, 2010 as not effective  - Dulera 200 started 11/09/10 >> much better December 21, 2010  Hypertension  Hives 07-2007  Allergic rhinitis  - Sinus ct 01/2009 wnl  Goiter 2009 > resected 12/29/08  Health Maintenance......................................[facility] [place]  - Pneumovax September 15, 2009  - 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, L Fem -1.9, 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 LUNGS: barrel chest, distant BS w/ no wheezing  CV: RRR no s3 or murmur or increase in P2, no edema  ABD: soft and nontender with nl excursion in the supine position. No bruits or organomegaly, bowel sounds nl  MS: warm without deformities, calf tenderness, cyanosis or clubbing      CXR  08/20/2013 :   No edema or consolidation. Stable nodular opacity left lower lobe. Underlying emphysema.   Assessment:

## 2013-09-17 NOTE — Patient Instructions (Addendum)
Follow med calendar closely and bring to each visit  Follow up with Dr. Wert  In 3 months and As needed    

## 2013-09-17 NOTE — Assessment & Plan Note (Signed)
Controlled on rx  Flu shot utd  Patient's medications were reviewed today and patient education was given. Computerized medication calendar was adjusted/completed  Plan  Continue on current regimen  Follow up in 3 months

## 2013-09-18 ENCOUNTER — Other Ambulatory Visit: Payer: Self-pay | Admitting: Internal Medicine

## 2013-09-24 ENCOUNTER — Ambulatory Visit: Payer: Medicare Other | Admitting: Internal Medicine

## 2013-10-01 ENCOUNTER — Other Ambulatory Visit: Payer: Self-pay | Admitting: Internal Medicine

## 2013-10-01 NOTE — Addendum Note (Signed)
Addended by: Boone Master E on: 10/01/2013 01:46 PM   Modules accepted: Orders, Medications

## 2013-10-05 ENCOUNTER — Encounter: Payer: Self-pay | Admitting: Internal Medicine

## 2013-10-05 ENCOUNTER — Ambulatory Visit (INDEPENDENT_AMBULATORY_CARE_PROVIDER_SITE_OTHER): Payer: Medicare Other | Admitting: Internal Medicine

## 2013-10-05 VITALS — BP 160/88 | HR 80 | Temp 98.4°F | Resp 16 | Wt 133.0 lb

## 2013-10-05 DIAGNOSIS — J449 Chronic obstructive pulmonary disease, unspecified: Secondary | ICD-10-CM

## 2013-10-05 DIAGNOSIS — J209 Acute bronchitis, unspecified: Secondary | ICD-10-CM

## 2013-10-05 MED ORDER — AZITHROMYCIN 250 MG PO TABS: ORAL_TABLET | ORAL | Status: DC

## 2013-10-05 NOTE — Assessment & Plan Note (Signed)
Zpac 

## 2013-10-05 NOTE — Progress Notes (Signed)
Subjective:    Patient ID: [name], female    DOB: [birth year], 67 y.o.   MRN: [id number]  HPI  C/o memory issues w/Tramadol C/o cough productive for yellow sputum  F/u rapid heart beat - Dr Sherene Sires stopped Losartan HCT. SBP 140-150 HR 90-100 The spasms in the back have resolved  The patient presents for a follow-up of  chronic hypertension elev BP at home  F/u on tremor - better, COPD, fatigue. C/o weakness, SOB. Working 4 d/wk - 20 hrs a week     BP Readings from Last 3 Encounters:  10/05/13 160/88  09/17/13 124/68  08/20/13 140/88   Wt Readings from Last 3 Encounters:  10/05/13 133 lb (60.328 kg)  09/17/13 136 lb (61.689 kg)  08/20/13 136 lb (61.689 kg)                  Review of Systems  Constitutional: Positive for fatigue. Negative for activity change, appetite change and unexpected weight change.  HENT: Negative for congestion, mouth sores and sinus pressure.   Eyes: Negative for visual disturbance.  Respiratory: Negative for chest tightness.   Gastrointestinal: Negative for nausea and abdominal pain.  Genitourinary: Negative for frequency, difficulty urinating and vaginal pain.  Musculoskeletal: Negative for back pain and gait problem.  Skin: Negative for pallor.  Neurological: Positive for tremors. Negative for dizziness, weakness and numbness.  Psychiatric/Behavioral: Negative for confusion and disturbed wake/sleep cycle. The patient is nervous/anxious.        Objective:   Physical Exam  Constitutional: She appears well-developed. No distress.  HENT:  Head: Normocephalic.  Right Ear: External ear normal.  Left Ear: External ear normal.  Nose: Nose normal.  Mouth/Throat: Oropharynx is clear and moist.       Hoarse, no thrush  Eyes: Conjunctivae are normal. Pupils are equal, round, and reactive to light. Right eye exhibits no discharge. Left eye exhibits no discharge.  Neck: Normal range of motion. Neck supple. No JVD present. No tracheal  deviation present. No thyromegaly present.  Cardiovascular: Normal rate, regular rhythm and normal heart sounds.   Pulmonary/Chest: Effort normal. No stridor. No respiratory distress. She has no wheezes. She has no rales. She exhibits no tenderness.  Abdominal: Soft. Bowel sounds are normal. She exhibits no distension and no mass. There is no tenderness. There is no rebound and no guarding.  Musculoskeletal: She exhibits no edema and no tenderness.  Lymphadenopathy:    She has no cervical adenopathy.  Neurological: She displays normal reflexes. No cranial nerve deficit. She exhibits normal muscle tone. Coordination normal.       Mild tremor  Skin: Faint rash noted on her back. No erythema.  Psychiatric: She has a normal mood and affect. Her behavior is normal. Judgment and thought content normal.   Tremor is much better  Lab Results   Lab Results  Component Value Date   WBC 11.3* 01/05/2013   HGB 13.4 01/05/2013   HCT 39.6 01/05/2013   PLT 300.0 01/05/2013   GLUCOSE 113* 01/05/2013   CHOL 226* 05/16/2010   TRIG 286.0* 05/16/2010   HDL 73.10 05/16/2010   LDLDIRECT 108.0 05/16/2010   ALT 15 10/01/2011   AST 15 10/01/2011   NA 137 01/05/2013   K 4.0 01/05/2013   CL 104 01/05/2013   CREATININE 1.1 01/05/2013   BUN 19 01/05/2013   CO2 23 01/05/2013   TSH 0.42 10/01/2011   HGBA1C 5.4 01/05/2013         Assessment & Plan:

## 2013-10-05 NOTE — Assessment & Plan Note (Signed)
Continue with current prescription therapy as reflected on the Med list.  

## 2013-10-05 NOTE — Progress Notes (Signed)
Pre visit review using our clinic review tool, if applicable. No additional management support is needed unless otherwise documented below in the visit note. 

## 2013-10-07 ENCOUNTER — Other Ambulatory Visit: Payer: Self-pay | Admitting: Internal Medicine

## 2013-11-02 ENCOUNTER — Telehealth: Payer: Self-pay | Admitting: Internal Medicine

## 2013-11-02 NOTE — Telephone Encounter (Signed)
Relevant patient education mailed to patient.  

## 2013-11-16 ENCOUNTER — Telehealth: Payer: Self-pay | Admitting: *Deleted

## 2013-11-16 ENCOUNTER — Telehealth: Payer: Self-pay

## 2013-11-16 MED ORDER — DIPHENOXYLATE-ATROPINE 2.5-0.025 MG PO TABS: 1.0000 | ORAL_TABLET | Freq: Four times a day (QID) | ORAL | Status: DC | PRN

## 2013-11-16 NOTE — Telephone Encounter (Signed)
The pt called and is hoping to get a medication called in for diarrhea.  She states this med was called in a couple years ago for her.    Callback - [phone]

## 2013-11-16 NOTE — Telephone Encounter (Signed)
Pt requesting OOW note for 2/2, 2/3, 2/4 returning on 11/19/13. Fax to [phone]. Sharyne RichtersAttn Jeremy.

## 2013-11-16 NOTE — Telephone Encounter (Signed)
Ok Thx 

## 2013-11-16 NOTE — Telephone Encounter (Signed)
Done. Pt informed.

## 2013-11-16 NOTE — Telephone Encounter (Signed)
Ok Lomotil OV if not better Thx

## 2013-11-18 NOTE — Telephone Encounter (Signed)
OOW note faxed to number below. Pt informed

## 2013-12-01 ENCOUNTER — Other Ambulatory Visit: Payer: Self-pay | Admitting: Internal Medicine

## 2013-12-12 ENCOUNTER — Other Ambulatory Visit: Payer: Self-pay | Admitting: Internal Medicine

## 2013-12-15 ENCOUNTER — Telehealth: Payer: Self-pay | Admitting: Internal Medicine

## 2013-12-15 MED ORDER — TRIAMCINOLONE ACETONIDE 0.5 % EX CREA: 1.0000 "application " | TOPICAL_CREAM | Freq: Three times a day (TID) | CUTANEOUS | Status: DC

## 2013-12-15 NOTE — Telephone Encounter (Signed)
Take Benadryl 50 mg qid Triamc cream Rx - enailed OV on 3/4 Thx

## 2013-12-15 NOTE — Telephone Encounter (Signed)
12/15/2013 Pt has a rash all over body and it itches really bad per pt.  No openings for appts until 3/4; pt wants to know if there is a cream or something they can use or should they just go to a dermatologist or something.  Please contact pt after 11 am to advise.

## 2013-12-16 ENCOUNTER — Encounter: Payer: Self-pay | Admitting: Physician Assistant

## 2013-12-16 ENCOUNTER — Ambulatory Visit (INDEPENDENT_AMBULATORY_CARE_PROVIDER_SITE_OTHER): Payer: Medicare Other | Admitting: Physician Assistant

## 2013-12-16 VITALS — BP 132/80 | HR 104 | Temp 98.8°F | Wt 137.0 lb

## 2013-12-16 DIAGNOSIS — L309 Dermatitis, unspecified: Secondary | ICD-10-CM

## 2013-12-16 DIAGNOSIS — L259 Unspecified contact dermatitis, unspecified cause: Secondary | ICD-10-CM

## 2013-12-16 MED ORDER — HYDROXYZINE HCL 25 MG PO TABS: 25.0000 mg | ORAL_TABLET | Freq: Three times a day (TID) | ORAL | Status: DC | PRN

## 2013-12-16 MED ORDER — METHYLPREDNISOLONE ACETATE 80 MG/ML IJ SUSP
80.0000 mg | Freq: Once | INTRAMUSCULAR | Status: AC
  Administered 2013-12-16: 80 mg via INTRAMUSCULAR

## 2013-12-16 NOTE — Progress Notes (Signed)
   Subjective:    Patient ID: Laura MelnickMelody [name], female    DOB: [birth year], 68 y.o.   MRN: [id number]  HPI Comments: Patient had phoned her PCP one day PTA and requested something for the rash. PCP phoned prescription for triamcinolone cream, patient has not picked the cream up from pharmacy yet.   Rash This is a new problem. The current episode started in the past 7 days. The problem has been gradually worsening since onset. The rash is diffuse (bilateral arms, legs, trunk). The rash is characterized by itchiness, dryness and redness. Associated with: no new medications, laundry/bath soaps, lotions,  or household chemicals. Pertinent negatives include no congestion, diarrhea, facial edema, fever, joint pain, shortness of breath, sore throat or vomiting. Past treatments include nothing.    Review of Systems  Constitutional: Negative for fever.  HENT: Negative for congestion and sore throat.   Respiratory: Negative for shortness of breath.   Gastrointestinal: Negative for vomiting and diarrhea.  Musculoskeletal: Negative for joint pain.  Skin: Positive for rash.   Past Medical History  Diagnosis Date  . COPD (chronic obstructive pulmonary disease)   . Anxiety   . Hypertension   . Allergic rhinitis   . Goiter       Objective:   Physical Exam  Vitals reviewed. Constitutional: She is oriented to person, place, and time. She appears well-developed and well-nourished. She appears distressed (mildly distressed secondary to itching).  HENT:  Head: Normocephalic and atraumatic.  Neck: Normal range of motion.  Cardiovascular: Normal rate and regular rhythm.   Pulmonary/Chest: Effort normal and breath sounds normal. No respiratory distress. She has no wheezes.  Neurological: She is alert and oriented to person, place, and time.  Skin: Skin is warm and dry. Rash noted.  Diffuse, erythematous, punctate, macular rash noted to bilateral U/LE, trunk and chest. No crusting or seeping noted. Appears  consistent with eczema.   Psychiatric: She has a normal mood and affect.   Filed Vitals:   12/16/13 0828  BP: 132/80  Pulse: 104  Temp: 98.8 F (37.1 C)       Assessment & Plan:   Eczema rash Depo-medrol 80 mg injection today. Rx for Hydroxyzine 25 mg tab every 6-8 hours prn for itch relief.  Patient will obtain Rx from pharmacy previously ordered by PCP Instructed to apply generously 2-3 times daily for next 7-10 days till symptoms resolve.  Instructed to RTO if symptoms become worse Phone office if refills required

## 2013-12-16 NOTE — Patient Instructions (Signed)
It was great to meet you today [name]!  I have sent a prescription to your pharmacy to help with relief of itching.  You have also had a prescription for Triamcinolone sent to your pharmacy, be certain to apply liberally twice daily for the next 7-10 days. If you require a refill please phone the office.         Eczema Eczema, also called atopic dermatitis, is a skin disorder that causes inflammation of the skin. It causes a red rash and dry, scaly skin. The skin becomes very itchy. Eczema is generally worse during the cooler winter months and often improves with the warmth of summer. Eczema usually starts showing signs in infancy. Some children outgrow eczema, but it may last through adulthood.  CAUSES  The exact cause of eczema is not known, but it appears to run in families. People with eczema often have a family history of eczema, allergies, asthma, or hay fever. Eczema is not contagious. Flare-ups of the condition may be caused by:   Contact with something you are sensitive or allergic to.   Stress. SIGNS AND SYMPTOMS  Dry, scaly skin.   Red, itchy rash.   Itchiness. This may occur before the skin rash and may be very intense.  DIAGNOSIS  The diagnosis of eczema is usually made based on symptoms and medical history. TREATMENT  Eczema cannot be cured, but symptoms usually can be controlled with treatment and other strategies. A treatment plan might include:  Controlling the itching and scratching.   Use over-the-counter antihistamines as directed for itching. This is especially useful at night when the itching tends to be worse.   Use over-the-counter steroid creams as directed for itching.   Avoid scratching. Scratching makes the rash and itching worse. It may also result in a skin infection (impetigo) due to a break in the skin caused by scratching.   Keeping the skin well moisturized with creams every day. This will seal in moisture and help prevent  dryness. Lotions that contain alcohol and water should be avoided because they can dry the skin.   Limiting exposure to things that you are sensitive or allergic to (allergens).   Recognizing situations that cause stress.   Developing a plan to manage stress.  HOME CARE INSTRUCTIONS   Only take over-the-counter or prescription medicines as directed by your health care provider.   Do not use anything on the skin without checking with your health care provider.   Keep baths or showers short (5 minutes) in warm (not hot) water. Use mild cleansers for bathing. These should be unscented. You may add nonperfumed bath oil to the bath water. It is best to avoid soap and bubble bath.   Immediately after a bath or shower, when the skin is still damp, apply a moisturizing ointment to the entire body. This ointment should be a petroleum ointment. This will seal in moisture and help prevent dryness. The thicker the ointment, the better. These should be unscented.   Keep fingernails cut short. Children with eczema may need to wear soft gloves or mittens at night after applying an ointment.   Dress in clothes made of cotton or cotton blends. Dress lightly, because heat increases itching.   A child with eczema should stay away from anyone with fever blisters or cold sores. The virus that causes fever blisters (herpes simplex) can cause a serious skin infection in children with eczema. SEEK MEDICAL CARE IF:   Your itching interferes with sleep.  Your rash gets worse or is not better within 1 week after starting treatment.   You see pus or soft yellow scabs in the rash area.   You have a fever.   You have a rash flare-up after contact with someone who has fever blisters.  Document Released: 09/28/2000 Document Revised: 07/22/2013 Document Reviewed: 05/04/2013 [facility] Patient Information 2014 [place] [place].

## 2013-12-16 NOTE — Progress Notes (Signed)
Pre-visit discussion using our clinic review tool. No additional management support is needed unless otherwise documented below in the visit note.  

## 2013-12-28 ENCOUNTER — Other Ambulatory Visit: Payer: Self-pay | Admitting: Internal Medicine

## 2013-12-31 ENCOUNTER — Encounter: Payer: Self-pay | Admitting: Internal Medicine

## 2013-12-31 ENCOUNTER — Ambulatory Visit (INDEPENDENT_AMBULATORY_CARE_PROVIDER_SITE_OTHER): Payer: Medicare Other | Admitting: Internal Medicine

## 2013-12-31 ENCOUNTER — Encounter: Payer: Self-pay | Admitting: *Deleted

## 2013-12-31 VITALS — BP 152/80 | HR 94 | Temp 98.4°F | Ht 61.5 in | Wt 136.4 lb

## 2013-12-31 DIAGNOSIS — R911 Solitary pulmonary nodule: Secondary | ICD-10-CM

## 2013-12-31 DIAGNOSIS — J449 Chronic obstructive pulmonary disease, unspecified: Secondary | ICD-10-CM

## 2013-12-31 DIAGNOSIS — J9610 Chronic respiratory failure, unspecified whether with hypoxia or hypercapnia: Secondary | ICD-10-CM

## 2013-12-31 NOTE — Progress Notes (Signed)
Subjective:     Patient ID: Laura MelnickMelody [name], female   DOB: January 30, 1946    MRN: [id number]    Brief patient profile:  [age] yowf who quit smoking in December 2007, with copd with minimum asthmatic component  baseline = grocery store ok, tends to avoid mall or superstores and [facility] since July [facility]     History of Present Illness  06/08/2013 f/u ov/[name] on pred 5 mg one daily and never need neb, avg saba sev times per day Chief Complaint  Patient presents with  . Followup with cxr and PFT    Pt states having increased SOB and cough since her last visit- relates this to hot, humid weather.  Cough is non prod and worsens when she goes out in the heat.   can still do [employer] s 02 on a good day but worse x sev weeks despite maint rx including pred at 5 mg daily assoc with worsening dry cough- rare saba hfa use  rec Stop losartan as your blood pressure is low > we may need to consider changing to alternative medication due to high heart rate   08/20/2013 f/u ov/[name] re: copd/ ? Flare vs anxiety / did not bring med calendar Chief Complaint  Patient presents with  . Acute Visit    Pt c/o increased SOB and non prod cough x 1 wk. Increased pred to 20 mg approx 5 days ago with no improvement.   no proair yet on day of ov. Using lots of proair, afraid to try saba neb "put me in the hosp last time". Sob just making a bed. Using proair up to 8 x daily with min relief but not needing while sleeping rec No change rx     12/31/2013 f/u ov/[name] re: recertify for 02/ till on 10 mg per day though floor dose should be 5 per calendar Chief Complaint  Patient presents with  . Follow-up    Recert for o2. Breathing is unchanged. No new co's today.    using saba  avg twice daily but usually p activity.  No obvious daytime variabilty or assoc cough or cp or chest tightness, subjective wheeze overt sinus or hb symptoms. No unusual exp hx or h/o childhood pna/ asthma or premature birth  to her knowledge.   Sleeping ok without nocturnal  or early am exacerbation  of respiratory  c/o's or need for noct saba. Also denies any obvious fluctuation of symptoms with weather or environmental changes or other aggravating or alleviating factors except as outlined above   Current Medications, Allergies, Past Medical History, Past Surgical History, Family History, and Social History were reviewed in [facility] electronic [facility] record.  ROS  The following are not active complaints unless bolded sore throat, dysphagia, dental problems, itching, sneezing,  nasal congestion or excess/ purulent secretions, ear ache,   fever, chills, sweats, unintended wt loss, pleuritic or exertional cp, hemoptysis,  orthopnea pnd or leg swelling, presyncope, palpitations, heartburn, abdominal pain, anorexia, nausea, vomiting, diarrhea  or change in bowel or urinary habits, change in stools or urine, dysuria,hematuria,  rash, arthralgias, visual complaints, headache, numbness weakness or ataxia or problems with walking or coordination,  change in mood/affect or memory.           Past Medical History:  COPD  - PFT's 12/07/08 FEV1 1.26(61%) ratio 42, 5% resp to B2  - PFT's 02/03/09 FEV1 .88 (38%) ratio 40  - PFT's 02/08/10 FEV1 1.30 (64%) ratio 45, 19%  resp to B2 and DLC046% corrects to 63%  - PFT's 04/30/2011        1.28(64%) ratio 46, no better b2  dlco  50%  - HFA technique 75% Mar 04, 2009 > 75% October 05, 2010> 90% November 09, 2010  - 02 dep at hs since April 2010, no desat walking October 05, 2010  - Prednsone maint April 25, 2009  - Add on Qvar 80 December 28, 2009 > d/c November 09, 2010 as not effective  - Dulera 200 started 11/09/10 >> much better December 21, 2010  Hypertension  Hives 07-2007  Allergic rhinitis  - Sinus ct 01/2009 wnl  Goiter 2009 > resected 12/29/08  Health Maintenance......................................[facility] [place]  - Pneumovax September 15, 2009  - DEXA 10/30/2009 T spine  -1.7, L 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, 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 LUNGS: barrel chest, distant BS w/ no wheezing  CV: RRR no s3 or murmur or increase in P2, no edema  ABD: soft and nontender with nl excursion in the supine position. No bruits or organomegaly, bowel sounds nl  MS: warm without deformities, calf tenderness, cyanosis or clubbing      CXR  08/20/2013 :  No edema or consolidation. Stable nodular opacity left lower lobe. Underlying emphysema.   Assessment:

## 2013-12-31 NOTE — Patient Instructions (Addendum)
Only use your albuterol as a rescue medication to be used if you can't catch your breath by resting or doing a relaxed purse lip breathing pattern.  - The less you use it, the better it will work when you need it. - Ok to use up to 2 puffs  every 4 hours if you must but call for immediate appointment if use goes up over your usual need - Don't leave home without it !!  (think of it like the spare tire for your car)  Floor dose for prednisone is 5 mg per day as per calendar  Use 02 with exercise at 2lpm but not needed at rest / standing/ or walking less than 200 ft  Please schedule a follow up visit in 3 months but call sooner if needed

## 2014-01-01 NOTE — Assessment & Plan Note (Signed)
03/03/2013 cxr 10 mm nodule within the left midlung >CT chest 03/06/2013 spiculated nodule in the left lower lobe laterally measures about 1 cm. Small spiculated nodule in right lower lobe medially measures 7.5 mm. . No adenopathy. . Low density nodules are noted thyroid gland.  - PET 06/12/2013 = neg > rec q 3mon cxr   F/u cxr next ov

## 2014-01-01 NOTE — Assessment & Plan Note (Signed)
-   PFT's 04/30/2011        1.28(64% ratio 46, no better b2  dlco  50%  - PFT's 06/08/2013  FEV1  1.33 (63%) with ratio 49 and no change p saba, DLC0 49 p am spiriva, dulera - HFA technique 75% Mar 04, 2009 > 75% October 05, 2010> 90% November 09, 2010  - 02 dep at hs since April 2010, no desat walking October 05, 2010  - Prednsone maint since  April 25, 2009  - Dulera 200 started 11/09/10 >> much better December 21, 2010  - hfa 90% 08/20/2013  -med calendar 09/17/2013   Using med calendar well except not updating when other docs make changes and not reducing prednisone to 5 mg floor we agreed to and this is the goal.    Each maintenance medication was reviewed in detail including most importantly the difference between maintenance and as needed and under what circumstances the prns are to be used. This was done in the context of a medication calendar review which provided the patient with a user-friendly unambiguous mechanism for medication administration and reconciliation and provides an action plan for all active problems. It is critical that this be shown to every doctor  for modification during the office visit if necessary so the patient can use it as a working document.

## 2014-01-01 NOTE — Assessment & Plan Note (Signed)
-   02 at hs only at 1.5.pm [facility]    - ONO ra 07/10/12 > 1 hour and 19 min with sats <88 so rec continue 02    - 08/20/2013  Walked RA x 3 laps @ 185 ft each stopped due to end of study, min sob, sats 88% at en    - 12/31/2013  Walked RA  2 laps @ 185 ft each stopped due to  Sat 88% at end, corrected on 1.5 lpm   rec = 1.5 lpm at hs and prn during the day unless walking more than 200 ft  See instructions for specific recommendations which were reviewed directly with the patient who was given a copy with highlighter outlining the key components.

## 2014-01-04 ENCOUNTER — Ambulatory Visit: Payer: Medicare Other | Admitting: Internal Medicine

## 2014-01-13 ENCOUNTER — Encounter (HOSPITAL_COMMUNITY): Payer: Self-pay | Admitting: Emergency Medicine

## 2014-01-13 ENCOUNTER — Telehealth: Payer: Self-pay | Admitting: Internal Medicine

## 2014-01-13 ENCOUNTER — Emergency Department (HOSPITAL_COMMUNITY)
Admission: EM | Admit: 2014-01-13 | Discharge: 2014-01-13 | Disposition: A | Discharge status: Home / Self Care | Payer: Medicare Other | Attending: Emergency Medicine | Admitting: Emergency Medicine

## 2014-01-13 DIAGNOSIS — H5789 Other specified disorders of eye and adnexa: Secondary | ICD-10-CM | POA: Insufficient documentation

## 2014-01-13 DIAGNOSIS — Z79899 Other long term (current) drug therapy: Secondary | ICD-10-CM | POA: Insufficient documentation

## 2014-01-13 DIAGNOSIS — F411 Generalized anxiety disorder: Secondary | ICD-10-CM | POA: Insufficient documentation

## 2014-01-13 DIAGNOSIS — I10 Essential (primary) hypertension: Secondary | ICD-10-CM | POA: Insufficient documentation

## 2014-01-13 DIAGNOSIS — R21 Rash and other nonspecific skin eruption: Secondary | ICD-10-CM

## 2014-01-13 DIAGNOSIS — B029 Zoster without complications: Secondary | ICD-10-CM | POA: Insufficient documentation

## 2014-01-13 DIAGNOSIS — Z862 Personal history of diseases of the blood and blood-forming organs and certain disorders involving the immune mechanism: Secondary | ICD-10-CM | POA: Insufficient documentation

## 2014-01-13 DIAGNOSIS — Z8639 Personal history of other endocrine, nutritional and metabolic disease: Secondary | ICD-10-CM | POA: Insufficient documentation

## 2014-01-13 DIAGNOSIS — Z87891 Personal history of nicotine dependence: Secondary | ICD-10-CM | POA: Insufficient documentation

## 2014-01-13 DIAGNOSIS — J449 Chronic obstructive pulmonary disease, unspecified: Secondary | ICD-10-CM | POA: Insufficient documentation

## 2014-01-13 DIAGNOSIS — IMO0002 Reserved for concepts with insufficient information to code with codable children: Secondary | ICD-10-CM | POA: Insufficient documentation

## 2014-01-13 DIAGNOSIS — J4489 Other specified chronic obstructive pulmonary disease: Secondary | ICD-10-CM | POA: Insufficient documentation

## 2014-01-13 HISTORY — DX: Unspecified asthma, uncomplicated: J45.909

## 2014-01-13 MED ORDER — HYDROCODONE-ACETAMINOPHEN 5-325 MG PO TABS: 1.0000 | ORAL_TABLET | ORAL | Status: DC | PRN

## 2014-01-13 MED ORDER — ACYCLOVIR 800 MG PO TABS: 800.0000 mg | ORAL_TABLET | Freq: Every day | ORAL | Status: DC

## 2014-01-13 MED ORDER — FLUORESCEIN SODIUM 1 MG OP STRP
1.0000 | ORAL_STRIP | Freq: Once | OPHTHALMIC | Status: AC
  Administered 2014-01-13: 1 via OPHTHALMIC
  Filled 2014-01-13: qty 1

## 2014-01-13 MED ORDER — TETRACAINE HCL 0.5 % OP SOLN
2.0000 [drp] | Freq: Once | OPHTHALMIC | Status: AC
  Administered 2014-01-13: 2 [drp] via OPHTHALMIC
  Filled 2014-01-13: qty 2

## 2014-01-13 MED ORDER — PREDNISONE 20 MG PO TABS: ORAL_TABLET | ORAL | Status: DC

## 2014-01-13 NOTE — Discharge Instructions (Signed)
Read the information below.  Use the prescribed medication as directed.  Please discuss all new medications with your pharmacist.  Do not take additional tylenol while taking the prescribed pain medication to avoid overdose.  You may return to the Emergency Department at any time for worsening condition or any new symptoms that concern you.  If you develop fevers, cough, shortness or breath or chest pain, change in your vision, confusion, or you feel sick, return to the ER immediately for a recheck.     Shingles Shingles (herpes zoster) is an infection that is caused by the same virus that causes chickenpox (varicella). The infection causes a painful skin rash and fluid-filled blisters, which eventually break open, crust over, and heal. It may occur in any area of the body, but it usually affects only one side of the body or face. The pain of shingles usually lasts about 1 month. However, some people with shingles may develop long-term (chronic) pain in the affected area of the body. Shingles often occurs many years after the person had chickenpox. It is more common:  In people older than 50 years.  In people with weakened immune systems, such as those with HIV, AIDS, or cancer.  In people taking medicines that weaken the immune system, such as transplant medicines.  In people under great stress. CAUSES  Shingles is caused by the varicella zoster virus (VZV), which also causes chickenpox. After a person is infected with the virus, it can remain in the person's body for years in an inactive state (dormant). To cause shingles, the virus reactivates and breaks out as an infection in a nerve root. The virus can be spread from person to person (contagious) through contact with open blisters of the shingles rash. It will only spread to people who have not had chickenpox. When these people are exposed to the virus, they may develop chickenpox. They will not develop shingles. Once the blisters scab over, the  person is no longer contagious and cannot spread the virus to others. SYMPTOMS  Shingles shows up in stages. The initial symptoms may be pain, itching, and tingling in an area of the skin. This pain is usually described as burning, stabbing, or throbbing.In a few days or weeks, a painful red rash will appear in the area where the pain, itching, and tingling were felt. The rash is usually on one side of the body in a band or belt-like pattern. Then, the rash usually turns into fluid-filled blisters. They will scab over and dry up in approximately 2 3 weeks. Flu-like symptoms may also occur with the initial symptoms, the rash, or the blisters. These may include:  Fever.  Chills.  Headache.  Upset stomach. DIAGNOSIS  Your caregiver will perform a skin exam to diagnose shingles. Skin scrapings or fluid samples may also be taken from the blisters. This sample will be examined under a microscope or sent to a lab for further testing. TREATMENT  There is no specific cure for shingles. Your caregiver will likely prescribe medicines to help you manage the pain, recover faster, and avoid long-term problems. This may include antiviral drugs, anti-inflammatory drugs, and pain medicines. HOME CARE INSTRUCTIONS   Take a cool bath or apply cool compresses to the area of the rash or blisters as directed. This may help with the pain and itching.   Only take over-the-counter or prescription medicines as directed by your caregiver.   Rest as directed by your caregiver.  Keep your rash and blisters clean with  mild soap and cool water or as directed by your caregiver.  Do not pick your blisters or scratch your rash. Apply an anti-itch cream or numbing creams to the affected area as directed by your caregiver.  Keep your shingles rash covered with a loose bandage (dressing).  Avoid skin contact with:  Babies.   Pregnant women.   Children with eczema.   Elderly people with transplants.    People with chronic illnesses, such as leukemia or AIDS.   Wear loose-fitting clothing to help ease the pain of material rubbing against the rash.  Keep all follow-up appointments with your caregiver.If the area involved is on your face, you may receive a referral for follow-up to a specialist, such as an eye doctor (ophthalmologist) or an ear, nose, and throat (ENT) doctor. Keeping all follow-up appointments will help you avoid eye complications, chronic pain, or disability.  SEEK IMMEDIATE MEDICAL CARE IF:   You have facial pain, pain around the eye area, or loss of feeling on one side of your face.  You have ear pain or ringing in your ear.  You have loss of taste.  Your pain is not relieved with prescribed medicines.   Your redness or swelling spreads.   You have more pain and swelling.  Your condition is worsening or has changed.   You have a feveror persistent symptoms for more than 2 3 days.  You have a fever and your symptoms suddenly get worse. MAKE SURE YOU:  Understand these instructions.  Will watch your condition.  Will get help right away if you are not doing well or get worse. Document Released: 10/01/2005 Document Revised: 06/25/2012 Document Reviewed: 05/15/2012 [facility] Patient Information 2014 [place], [place].  Rash A rash is a change in the color or texture of your skin. There are many different types of rashes. You may have other problems that accompany your rash. CAUSES   Infections.  Allergic reactions. This can include allergies to pets or foods.  Certain medicines.  Exposure to certain chemicals, soaps, or cosmetics.  Heat.  Exposure to poisonous plants.  Tumors, both cancerous and noncancerous. SYMPTOMS   Redness.  Scaly skin.  Itchy skin.  Dry or cracked skin.  Bumps.  Blisters.  Pain. DIAGNOSIS  Your caregiver may do a physical exam to determine what type of rash you have. A skin sample (biopsy) may be taken  and examined under a microscope. TREATMENT  Treatment depends on the type of rash you have. Your caregiver may prescribe certain medicines. For serious conditions, you may need to see a skin doctor (dermatologist). HOME CARE INSTRUCTIONS   Avoid the substance that caused your rash.  Do not scratch your rash. This can cause infection.  You may take cool baths to help stop itching.  Only take over-the-counter or prescription medicines as directed by your caregiver.  Keep all follow-up appointments as directed by your caregiver. SEEK IMMEDIATE MEDICAL CARE IF:  You have increasing pain, swelling, or redness.  You have a fever.  You have new or severe symptoms.  You have body aches, diarrhea, or vomiting.  Your rash is not better after 3 days. MAKE SURE YOU:  Understand these instructions.  Will watch your condition.  Will get help right away if you are not doing well or get worse. Document Released: 09/21/2002 Document Revised: 12/24/2011 Document Reviewed: 07/16/2011 [facility] Patient Information 2014 [place], [place].

## 2014-01-13 NOTE — Telephone Encounter (Signed)
Patient Information:  Caller Name: Jelisha  Phone: [phone]  Patient: [name], [name]  Gender: Female  DOB: 1946-07-29  Age: 68 Years  PCP: Plotnikov, Alex (Adults only)  Office Follow Up:  Does the office need to follow up with this patient?: No  Instructions For The Office: N/A  RN Note:  Contacted the office and spoke with Eber Jonesarolyn for scheduling. She referred patient to ED. Care advice provided. Understanding expressed.  Symptoms  Reason For Call & Symptoms: Patient states that 12/16/13, she was seen in office and diagnosed with Eczema.  She reports yesterday 01/12/14 she noted rash on her right leg that looked like the Eczema.  This morning , her Left side of her face is swollen along with her lips swelling. Voice is scratchy. There is no rash on her face but she describes a few bumps and redness.  She still has rash on her legs. .  No difficulty breathing or swallowing. .she feels like she has blisters inside her mouth  Reviewed Health History In EMR: Yes  Reviewed Medications In EMR: Yes  Reviewed Allergies In EMR: Yes  Reviewed Surgeries / Procedures: Yes  Date of Onset of Symptoms: 01/12/2014  Guideline(s) Used:  Face Swelling  Disposition Per Guideline:   Go to Office Now  Reason For Disposition Reached:   Widespread rash on body  Advice Given:  Antihistamine Medication for Facial Swelling:  Sometimes antihistamine medications are helpful in reducing swelling from a food or allergic reaction. You can take diphenhydramine (e.g., Benadryl). The adult dosage 25-50 mg by mouth every 6 hours on an as needed basis.  Call Back If  Difficulty breathing or difficulty swallowing occurs  You become worse.  RN Overrode Recommendation:  Go To ED  No appt in office per ParksideCarolyn. refer to ED

## 2014-01-13 NOTE — ED Provider Notes (Signed)
Medical screening examination/treatment/procedure(s) were conducted as a shared visit with non-physician practitioner(s) and myself.  I personally evaluated the patient during the encounter.   EKG Interpretation None      Patient seen by me. The patient was some discomfort to the left side of her face for the past day or so today's breaking out with a vesicular rash in the trigeminal nerve distribution midportion of the face involving some of the oral MICU Coso. Also involving some of the lower eyelid. This is consistent with shingles. Patient did not have shingles vaccination in the past. Patient will need for seen staining of the cornea followup with her I surgeon. Will need to be started on anti-viral like acyclovir. Patient's rash otherwise was probably unrelated but could have been the stressor that brought on the herpes zoster outbreak.    Shelda JakesScott W. Fani Rotondo, MD 01/13/14 [phone]

## 2014-01-13 NOTE — ED Notes (Signed)
Patient states that she was here recently for eczema.   She advised that she has now had outbreak on R thigh and L face.   Patient states "the cream they gave me is not working".

## 2014-01-13 NOTE — ED Provider Notes (Signed)
CSN: [id number]     Arrival date & time 01/13/14  4098 History   First MD Initiated Contact with Patient 01/13/14 1006   This chart was scribed for Laura Dredge PA-C, a non-physician practitioner working with Shelda Jakes, MD by Lewanda Rife, ED Scribe. This patient was seen in room TR10C/TR10C and the patient's care was started at 10:18 AM      Chief Complaint  Patient presents with  . Rash     (Consider location/radiation/quality/duration/timing/severity/associated sxs/prior Treatment) The history is provided by the patient. No language interpreter was used.   HPI Comments: [name] is a 68 y.o. female who presents to the Emergency Department complaining of rash on left face, and on right thigh onset 12/16/13, but gradually exacerbated last week with no known precipitating factors. Describes rash as uncomfortable on face and pruritic on torso and right thigh. Reports associated left eye redness and mild yellow crusting, and describes it as "uncomfortable". Reports trying Aveeno on body to alleviate rash with mild relief of symptoms. No other change in personal care products.  Denies any aggravating factors. Denies associated photophobia, shortness of breath, change in vision, dysphagia, fever, change in medications, known tick bits, and outdoors recently. Denies known sick contacts. Reports PMHx of asthma.   12/16/13 evaluated by Baltazar Apo PA at her primary doctor's office. Pt states she was dx with eczema and given hydroxyzine, triamcinolone, and DepoMedrol injection, which relieved symptoms until last week. Denies getting a shingles vaccine.  She did have chicken pox as a child.     Past Medical History  Diagnosis Date  . COPD (chronic obstructive pulmonary disease)   . Anxiety   . Hypertension   . Allergic rhinitis   . Goiter   . Asthma    Past Surgical History  Procedure Laterality Date  . Tubal ligation    . Tonsillectomy    . Wisdom tooth extraction    .  Thyroidectomy  12/29/08   Family History  Problem Relation Age of Onset  . Hypertension Father    History  Substance Use Topics  . Smoking status: Former Smoker -- 1.00 packs/day for 40 years    Types: Cigarettes    Quit date: 04/01/2005  . Smokeless tobacco: Never Used  . Alcohol Use: Yes     Comment: rare   OB History   Grav Para Term Preterm Abortions TAB SAB Ect Mult Living                 Review of Systems  Constitutional: Negative for fever.  Eyes: Positive for discharge and redness. Negative for photophobia and visual disturbance.  Skin: Positive for rash.  Psychiatric/Behavioral: Negative for confusion.  All other systems reviewed and are negative.      Allergies  Tramadol  Home Medications   Current Outpatient Rx  Name  Route  Sig  Dispense  Refill  . albuterol (PROVENTIL) (2.5 MG/3ML) 0.083% nebulizer solution      1 vial in neb every 4-6 hours as needed (PLAN C)         . Calcium Carbonate-Vitamin D (CALCIUM-VITAMIN D) 500-200 MG-UNIT per tablet   Oral   Take 1 tablet by mouth 2 (two) times daily with a meal.         . cetirizine (ZYRTEC) 10 MG tablet      Take 1 at bedtime as needed         . cholecalciferol (VITAMIN D) 1000 UNITS tablet   Oral  Take 1,000 Units by mouth daily.           . cyclobenzaprine (FLEXERIL) 5 MG tablet   Oral   Take 5 mg by mouth 2 (two) times daily as needed.         Marland Kitchen dextromethorphan-guaiFENesin (MUCINEX DM) 30-600 MG per 12 hr tablet   Oral   Take 1-2 tablets by mouth every 12 (twelve) hours as needed.          . diazepam (VALIUM) 5 MG tablet      TAKE 1 TABLET BY MOUTH EVERY 6 HOURS AS NEEDED FOR ANXIETY   120 tablet   0   . docusate sodium (COLACE) 100 MG capsule   Oral   Take 100 mg by mouth daily as needed for mild constipation.         . DULERA 200-5 MCG/ACT AERO      INHALE 2 PUFFS FIRST THING IN THE MORNING AND 2 PUFFS AGAIN IN EVENING ABOUT 12 HOURS LATER   1 Inhaler   5   .  famotidine (PEPCID) 20 MG tablet      Add 1 at bedtime until cough is gone         . hydrOXYzine (ATARAX/VISTARIL) 25 MG tablet   Oral   Take 1 tablet (25 mg total) by mouth every 8 (eight) hours as needed for itching.   30 tablet   0   . ibuprofen (ADVIL,MOTRIN) 200 MG tablet   Oral   Take 200 mg by mouth every 6 (six) hours as needed.         . NON FORMULARY      Oxygen  1.5 L at bedtime         . omeprazole (PRILOSEC) 40 MG capsule   Oral   Take 40 mg by mouth daily.          . predniSONE (DELTASONE) 5 MG tablet      See med calendar -- 4 daily until back to baseline, then 1 daily x7 days, then 1/2 daily         . PROAIR HFA 108 (90 BASE) MCG/ACT inhaler      INHALE 2 PUFFS INTO LUNGS EVERY 4 HOURS AS NEEDED WHEEZING   8.5 each   2   . tiotropium (SPIRIVA HANDIHALER) 18 MCG inhalation capsule   Inhalation   Place 1 capsule (18 mcg total) into inhaler and inhale daily. (PLAN A)   30 capsule   11   . triamcinolone cream (KENALOG) 0.5 %   Topical   Apply 1 application topically 3 (three) times daily.   90 g   1   . verapamil (VERELAN PM) 180 MG 24 hr capsule      TAKE 1 CAPSULE (180 MG TOTAL) BY MOUTH AT BEDTIME.   30 capsule   5    BP 182/105  Pulse 102  Temp(Src) 98.6 F (37 C) (Oral)  Resp 22  Ht 5\' 1"  (1.549 m)  Wt 133 lb (60.328 kg)  BMI 25.14 kg/m2  SpO2 97% Physical Exam  Nursing note and vitals reviewed. Constitutional: She appears well-developed and well-nourished. No distress.  HENT:  Head: Normocephalic and atraumatic.  Eyes: EOM are normal. Pupils are equal, round, and reactive to light. Left eye exhibits discharge (yellow crusting ). Left eye exhibits no chemosis and no exudate. No foreign body present in the left eye. Left conjunctiva is injected.  Slit lamp exam:      The left eye shows  no corneal abrasion, no corneal ulcer and no fluorescein uptake.  No dendritic lesions noted.  Neck: Neck supple.  Cardiovascular: Normal  rate and regular rhythm.   Pulmonary/Chest: Effort normal and breath sounds normal. No respiratory distress. She has no wheezes. She has no rales.  Abdominal: Soft. She exhibits no distension. There is no tenderness. There is no rebound and no guarding.  Neurological: She is alert.  Skin: Rash noted. She is not diaphoretic.  Diffuse erythematous papular scaly rash over bilateral lower extremities. Nontender.  No edema.   Abdomen also with light, diffuse papular rash.   Sparse areas of erythematous rash over left dorsal hand.  Not within intertriginous spaces of hands.   Left face with erythema, nontender,  Left eye injected, yellow crusting.  Left upper lip with 2 vesicles, left upper inner lip with two scabbed lesions, no discharge.  Nose is spared.    Psychiatric: She has a normal mood and affect. Her behavior is normal.    ED Course  Procedures (including critical care time)  COORDINATION OF CARE:  Nursing notes reviewed. Vital signs reviewed. Initial pt interview and examination performed.   10:20 AM-Discussed work up plan with pt at bedside, which includes No orders of the defined types were placed in this encounter.  . Pt agrees with plan.  10:31 AM Discussed pt with Dr. Deretha EmoryZackowski who will also see and examine patient.    Treatment plan initiated:Medications - No data to display   Initial diagnostic testing ordered.     Labs Review Labs Reviewed - No data to display Imaging Review No results found.   EKG Interpretation None      MDM   Final diagnoses:  Herpes zoster  Rash    Pt with diffuse rash over lower extremities and somewhat on trunk, unknown etiology, but consistent with allergic or contact dermatitis.  She had relief initially with treatment by PCP but the symptoms returned.  She now has an uncomfortable rash on her left face that appears dermatomal, has crusting and blisters on her left lip.  Orophaynx is clear, no swelling, no airway concerns.  Nose is  spared.  No dendritic lesions on eye exam.  Pt also seen by Dr Deretha EmoryZackowski who confirms diagnosis.  No CP, SOB, cough.  D/C home with acyclovir, prednisone, pain medication (pt is concerned it may become very painful).  She has her own ophthalmologist for follow up.  Also PCP follow up.  Pt given note for work, told to avoid any immunocompromised people and made aware that the virus is contagious.  Answered all questions.  Discussed result, findings, treatment, and follow up  with patient.  Pt given return precautions.  Pt verbalizes understanding and agrees with plan.      I personally performed the services described in this documentation, which was scribed in my presence. The recorded information has been reviewed and is accurate.    Laura Dredgemily Madyn Ivins, PA-C 01/13/14 1418

## 2014-01-18 ENCOUNTER — Ambulatory Visit (INDEPENDENT_AMBULATORY_CARE_PROVIDER_SITE_OTHER): Payer: Medicare Other | Admitting: Internal Medicine

## 2014-01-18 ENCOUNTER — Encounter: Payer: Self-pay | Admitting: Internal Medicine

## 2014-01-18 VITALS — BP 168/100 | HR 80 | Temp 98.3°F | Resp 16 | Wt 136.0 lb

## 2014-01-18 DIAGNOSIS — B0233 Zoster keratitis: Secondary | ICD-10-CM

## 2014-01-18 DIAGNOSIS — B029 Zoster without complications: Secondary | ICD-10-CM | POA: Insufficient documentation

## 2014-01-18 MED ORDER — HYDROCODONE-ACETAMINOPHEN 10-325 MG PO TABS: 0.5000 | ORAL_TABLET | Freq: Three times a day (TID) | ORAL | Status: DC | PRN

## 2014-01-18 MED ORDER — ERYTHROMYCIN 5 MG/GM OP OINT: 1.0000 "application " | TOPICAL_OINTMENT | Freq: Three times a day (TID) | OPHTHALMIC | Status: DC

## 2014-01-18 NOTE — Progress Notes (Signed)
Pre visit review using our clinic review tool, if applicable. No additional management support is needed unless otherwise documented below in the visit note. 

## 2014-01-18 NOTE — Assessment & Plan Note (Signed)
4/15 L eye Acyclovir Erythro oint Dr Vonna KotykBevis

## 2014-01-18 NOTE — Progress Notes (Signed)
Subjective:    Patient ID: Laura MelnickMelody [name], female    DOB: 02-20-1946, 68 y.o.   MRN: [id number]  HPI  F/u ER visit for H zoster L face/L eye -- 01/13/14. C/o pain - pain meds don't work  No blurred vision  F/u rapid heart beat - Dr Sherene SiresWert stopped Losartan HCT. SBP 140-150 HR 90-100 The spasms in the back have resolved  The patient presents for a follow-up of  chronic hypertension elev BP at home  F/u on tremor - better, COPD, fatigue. C/o weakness, SOB. Working 4 d/wk - 20 hrs a week     BP Readings from Last 3 Encounters:  01/18/14 168/100  01/13/14 160/90  12/31/13 152/80   Wt Readings from Last 3 Encounters:  01/18/14 136 lb (61.689 kg)  01/13/14 133 lb (60.328 kg)  12/31/13 136 lb 6.4 oz (61.871 kg)                  Review of Systems  Constitutional: Positive for fatigue. Negative for activity change, appetite change and unexpected weight change.  HENT: Negative for congestion, mouth sores and sinus pressure.   Eyes: Negative for visual disturbance.  Respiratory: Negative for chest tightness.   Gastrointestinal: Negative for nausea and abdominal pain.  Genitourinary: Negative for frequency, difficulty urinating and vaginal pain.  Musculoskeletal: Negative for back pain and gait problem.  Skin: Negative for pallor.  Neurological: Positive for tremors. Negative for dizziness, weakness and numbness.  Psychiatric/Behavioral: Negative for confusion and disturbed wake/sleep cycle. The patient is nervous/anxious.        Objective:   Physical Exam  Constitutional: She appears well-developed. No distress.  HENT: L eye w/conj erythema Head: Normocephalic.  Right Ear: External ear normal.  Left Ear: External ear normal.  Nose: Nose normal.  Mouth/Throat: Oropharynx is clear and moist. L inner upper lip/cheek w/ulcers Eyes: Conjunctivae are normal. Pupils are equal, round, and reactive to light. Right eye exhibits no discharge. Left eye exhibits no discharge.   Neck: Normal range of motion. Neck supple. No JVD present. No tracheal deviation present. No thyromegaly present.  Cardiovascular: Normal rate, regular rhythm and normal heart sounds.   Pulmonary/Chest: Effort normal. No stridor. No respiratory distress. She has no wheezes. She has no rales. She exhibits no tenderness.  Abdominal: Soft. Bowel sounds are normal. She exhibits no distension and no mass. There is no tenderness. There is no rebound and no guarding.  Musculoskeletal: She exhibits no edema and no tenderness.  Lymphadenopathy:    She has no cervical adenopathy.  Neurological: She displays normal reflexes. No cranial nerve deficit. She exhibits normal muscle tone. Coordination normal.       Mild tremor  Skin: crusted H zoster rash on L face  Psychiatric: She has a normal mood and affect. Her behavior is normal. Judgment and thought content normal.   Tremor is much better  Lab Results   Lab Results  Component Value Date   WBC 11.3* 01/05/2013   HGB 13.4 01/05/2013   HCT 39.6 01/05/2013   PLT 300.0 01/05/2013   GLUCOSE 113* 01/05/2013   CHOL 226* 05/16/2010   TRIG 286.0* 05/16/2010   HDL 73.10 05/16/2010   LDLDIRECT 108.0 05/16/2010   ALT 15 10/01/2011   AST 15 10/01/2011   NA 137 01/05/2013   K 4.0 01/05/2013   CL 104 01/05/2013   CREATININE 1.1 01/05/2013   BUN 19 01/05/2013   CO2 23 01/05/2013   TSH 0.42 10/01/2011   HGBA1C 5.4 01/05/2013  Assessment & Plan:

## 2014-01-18 NOTE — Assessment & Plan Note (Signed)
On Acyclovir Norco 10/325 prn

## 2014-01-25 ENCOUNTER — Ambulatory Visit (INDEPENDENT_AMBULATORY_CARE_PROVIDER_SITE_OTHER): Payer: Medicare Other

## 2014-01-25 ENCOUNTER — Ambulatory Visit (INDEPENDENT_AMBULATORY_CARE_PROVIDER_SITE_OTHER): Payer: Medicare Other | Admitting: Internal Medicine

## 2014-01-25 ENCOUNTER — Encounter: Payer: Self-pay | Admitting: Internal Medicine

## 2014-01-25 ENCOUNTER — Other Ambulatory Visit: Payer: Self-pay | Admitting: Internal Medicine

## 2014-01-25 VITALS — BP 110/70 | HR 84 | Temp 100.9°F | Resp 16 | Wt 133.0 lb

## 2014-01-25 DIAGNOSIS — R509 Fever, unspecified: Secondary | ICD-10-CM

## 2014-01-25 DIAGNOSIS — B029 Zoster without complications: Secondary | ICD-10-CM

## 2014-01-25 DIAGNOSIS — B0233 Zoster keratitis: Secondary | ICD-10-CM

## 2014-01-25 DIAGNOSIS — B0229 Other postherpetic nervous system involvement: Secondary | ICD-10-CM

## 2014-01-25 MED ORDER — OXYCODONE HCL 10 MG PO TABS: 5.0000 mg | ORAL_TABLET | Freq: Four times a day (QID) | ORAL | Status: DC | PRN

## 2014-01-25 MED ORDER — GABAPENTIN 100 MG PO CAPS: 100.0000 mg | ORAL_CAPSULE | Freq: Three times a day (TID) | ORAL | Status: DC | PRN

## 2014-01-25 NOTE — Progress Notes (Signed)
Subjective:    Patient ID: Laura MelnickMelody [name], female    DOB: 1946-07-22, 68 y.o.   MRN: [id number]  HPI  F/u for H zoster L face/L eye -- 01/13/14. C/o pain 7/10 on 10 mg Hydrocodone - pain meds don't work  No blurred vision. She did see an eye dr  F/u rapid heart beat - Dr Sherene SiresWert stopped Losartan HCT. SBP 140-150 HR 90-100 The spasms in the back have resolved  The patient presents for a follow-up of  chronic hypertension elev BP at home  F/u on tremor - better, COPD, fatigue. C/o weakness, SOB. Working 4 d/wk - 20 hrs a week     BP Readings from Last 3 Encounters:  01/25/14 110/70  01/18/14 168/100  01/13/14 160/90   Wt Readings from Last 3 Encounters:  01/25/14 133 lb (60.328 kg)  01/18/14 136 lb (61.689 kg)  01/13/14 133 lb (60.328 kg)                  Review of Systems  Constitutional: Positive for fatigue. Negative for activity change, appetite change and unexpected weight change.  HENT: Negative for congestion, mouth sores and sinus pressure.   Eyes: Negative for visual disturbance.  Respiratory: Negative for chest tightness.   Gastrointestinal: Negative for nausea and abdominal pain.  Genitourinary: Negative for frequency, difficulty urinating and vaginal pain.  Musculoskeletal: Negative for back pain and gait problem.  Skin: Negative for pallor.  Neurological: Positive for tremors. Negative for dizziness, weakness and numbness.  Psychiatric/Behavioral: Negative for confusion and disturbed wake/sleep cycle. The patient is nervous/anxious.        Objective:   Physical Exam  Constitutional: She appears well-developed. No distress.  HENT: L eye w/conj erythema Head: Normocephalic.  Right Ear: External ear normal.  Left Ear: External ear normal.  Nose: Nose normal.  Mouth/Throat: Oropharynx is clear and moist. L inner upper lip/cheek w/ulcers Eyes: Conjunctivae are normal. Pupils are equal, round, and reactive to light. Right eye exhibits no discharge. Left  eye exhibits no discharge.  Neck: Normal range of motion. Neck supple. No JVD present. No tracheal deviation present. No thyromegaly present.  Cardiovascular: Normal rate, regular rhythm and normal heart sounds.   Pulmonary/Chest: Effort normal. No stridor. No respiratory distress. She has no wheezes. She has no rales. She exhibits no tenderness.  Abdominal: Soft. Bowel sounds are normal. She exhibits no distension and no mass. There is no tenderness. There is no rebound and no guarding.  Musculoskeletal: She exhibits no edema and no tenderness.  Lymphadenopathy:    She has no cervical adenopathy.  Neurological: She displays normal reflexes. No cranial nerve deficit. She exhibits normal muscle tone. Coordination normal.       Mild tremor  Skin: crusted H zoster rash on L face  Psychiatric: She has a normal mood and affect. Her behavior is normal. Judgment and thought content normal.   Tremor is much better  Lab Results   Lab Results  Component Value Date   WBC 11.3* 01/05/2013   HGB 13.4 01/05/2013   HCT 39.6 01/05/2013   PLT 300.0 01/05/2013   GLUCOSE 113* 01/05/2013   CHOL 226* 05/16/2010   TRIG 286.0* 05/16/2010   HDL 73.10 05/16/2010   LDLDIRECT 108.0 05/16/2010   ALT 15 10/01/2011   AST 15 10/01/2011   NA 137 01/05/2013   K 4.0 01/05/2013   CL 104 01/05/2013   CREATININE 1.1 01/05/2013   BUN 19 01/05/2013   CO2 23 01/05/2013   TSH  0.42 10/01/2011   HGBA1C 5.4 01/05/2013         Assessment & Plan:

## 2014-01-25 NOTE — Assessment & Plan Note (Signed)
Resolved - s/p eye exam

## 2014-01-25 NOTE — Assessment & Plan Note (Signed)
No new rash

## 2014-01-25 NOTE — Assessment & Plan Note (Signed)
4/15 L face Rx resistant D/c Hydrocodone Start Oxycodone and Neurontin Potential benefits of a short/long term opioids use as well as potential risks (i.e. addiction risk, apnea etc) and complications (i.e. Somnolence, constipation and others) were explained to the patient and were aknowledged.

## 2014-01-25 NOTE — Progress Notes (Signed)
Pre visit review using our clinic review tool, if applicable. No additional management support is needed unless otherwise documented below in the visit note. 

## 2014-01-26 LAB — URINALYSIS, ROUTINE W REFLEX MICROSCOPIC
BILIRUBIN URINE: NEGATIVE
HGB URINE DIPSTICK: NEGATIVE
KETONES UR: NEGATIVE
LEUKOCYTES UA: NEGATIVE
NITRITE: POSITIVE — AB
RBC / HPF: NONE SEEN (ref 0–?)
Specific Gravity, Urine: 1.02 (ref 1.000–1.030)
Urine Glucose: NEGATIVE
Urobilinogen, UA: 0.2 (ref 0.0–1.0)
pH: 6 (ref 5.0–8.0)

## 2014-02-01 ENCOUNTER — Encounter: Payer: Self-pay | Admitting: Internal Medicine

## 2014-02-01 ENCOUNTER — Ambulatory Visit (INDEPENDENT_AMBULATORY_CARE_PROVIDER_SITE_OTHER): Payer: Medicare Other | Admitting: Internal Medicine

## 2014-02-01 VITALS — BP 120/80 | HR 80 | Temp 99.9°F | Resp 16 | Wt 129.0 lb

## 2014-02-01 DIAGNOSIS — B0229 Other postherpetic nervous system involvement: Secondary | ICD-10-CM

## 2014-02-01 DIAGNOSIS — B029 Zoster without complications: Secondary | ICD-10-CM

## 2014-02-01 NOTE — Assessment & Plan Note (Signed)
Slow improvement - to work next week 4/28 if ok 

## 2014-02-01 NOTE — Progress Notes (Signed)
Subjective:    Patient ID: Laura MelnickMelody Mcilvain, female    DOB: 1946/01/23, 68 y.o.   MRN: [id number]  HPI  F/u for H zoster L face/L eye -- 01/13/14. C/o pain 3/10 on 10 mg Hydrocodone - pain meds don't work  No blurred vision. She did see an eye dr  F/u rapid heart beat - Dr Sherene SiresWert stopped Losartan HCT. SBP 140-150 HR 90-100 The spasms in the back have resolved  The patient presents for a follow-up of  chronic hypertension elev BP at home  F/u on tremor - better, COPD, fatigue. C/o weakness, SOB. Working 4 d/wk - 20 hrs a week     BP Readings from Last 3 Encounters:  02/01/14 120/80  01/25/14 110/70  01/18/14 168/100   Wt Readings from Last 3 Encounters:  02/01/14 129 lb (58.514 kg)  01/25/14 133 lb (60.328 kg)  01/18/14 136 lb (61.689 kg)                  Review of Systems  Constitutional: Positive for fatigue. Negative for activity change, appetite change and unexpected weight change.  HENT: Negative for congestion, mouth sores and sinus pressure.   Eyes: Negative for visual disturbance.  Respiratory: Negative for chest tightness.   Gastrointestinal: Negative for nausea and abdominal pain.  Genitourinary: Negative for frequency, difficulty urinating and vaginal pain.  Musculoskeletal: Negative for back pain and gait problem.  Skin: Negative for pallor.  Neurological: Positive for tremors. Negative for dizziness, weakness and numbness.  Psychiatric/Behavioral: Negative for confusion and disturbed wake/sleep cycle. The patient is nervous/anxious.        Objective:   Physical Exam  Constitutional: She appears well-developed. No distress.  HENT: L eye w/conj erythema Head: Normocephalic.  Right Ear: External ear normal.  Left Ear: External ear normal.  Nose: Nose normal.  Mouth/Throat: Oropharynx is clear and moist. L inner upper lip/cheek w/ulcers Eyes: Conjunctivae are normal. Pupils are equal, round, and reactive to light. Right eye exhibits no discharge. Left  eye exhibits no discharge.  Neck: Normal range of motion. Neck supple. No JVD present. No tracheal deviation present. No thyromegaly present.  Cardiovascular: Normal rate, regular rhythm and normal heart sounds.   Pulmonary/Chest: Effort normal. No stridor. No respiratory distress. She has no wheezes. She has no rales. She exhibits no tenderness.  Abdominal: Soft. Bowel sounds are normal. She exhibits no distension and no mass. There is no tenderness. There is no rebound and no guarding.  Musculoskeletal: She exhibits no edema and no tenderness.  Lymphadenopathy:    She has no cervical adenopathy.  Neurological: She displays normal reflexes. No cranial nerve deficit. She exhibits normal muscle tone. Coordination normal.       Mild tremor  Skin: crusted H zoster rash on L face  Psychiatric: She has a normal mood and affect. Her behavior is normal. Judgment and thought content normal.   Tremor is much better  Lab Results   Lab Results  Component Value Date   WBC 11.3* 01/05/2013   HGB 13.4 01/05/2013   HCT 39.6 01/05/2013   PLT 300.0 01/05/2013   GLUCOSE 113* 01/05/2013   CHOL 226* 05/16/2010   TRIG 286.0* 05/16/2010   HDL 73.10 05/16/2010   LDLDIRECT 108.0 05/16/2010   ALT 15 10/01/2011   AST 15 10/01/2011   NA 137 01/05/2013   K 4.0 01/05/2013   CL 104 01/05/2013   CREATININE 1.1 01/05/2013   BUN 19 01/05/2013   CO2 23 01/05/2013   TSH  0.42 10/01/2011   HGBA1C 5.4 01/05/2013         Assessment & Plan:

## 2014-02-01 NOTE — Assessment & Plan Note (Signed)
Slow improvement - to work next week 4/28 if ok

## 2014-02-01 NOTE — Progress Notes (Signed)
Pre visit review using our clinic review tool, if applicable. No additional management support is needed unless otherwise documented below in the visit note. 

## 2014-02-02 ENCOUNTER — Other Ambulatory Visit: Payer: Self-pay | Admitting: Physician Assistant

## 2014-02-05 ENCOUNTER — Other Ambulatory Visit: Payer: Self-pay

## 2014-02-05 NOTE — Telephone Encounter (Signed)
[facility] [phone] sent a request for refill on Hydroxyzine 25 mg take 1 tablet by mouth every 8 hours as needed for itching #30. I do not see this on her current med list. Is this okay?

## 2014-02-06 MED ORDER — HYDROXYZINE HCL 25 MG PO TABS: 25.0000 mg | ORAL_TABLET | Freq: Three times a day (TID) | ORAL | Status: DC | PRN

## 2014-02-09 ENCOUNTER — Encounter: Payer: Medicare Other | Admitting: Internal Medicine

## 2014-02-10 ENCOUNTER — Telehealth: Payer: Self-pay

## 2014-02-10 NOTE — Telephone Encounter (Signed)
The patient called and is hoping to get excused from work for longer than planned.  She states she needs documentation from the dr for this.  She states she is unable to drive and is still taking pain medication.   Thanks!   Callback - [phone]

## 2014-02-10 NOTE — Telephone Encounter (Signed)
Ok w/me Thx 

## 2014-02-11 NOTE — Telephone Encounter (Signed)
Pt informed. She wants to return to work on 03/01/14. [place]axed to [employer] at [phone].

## 2014-02-17 ENCOUNTER — Emergency Department (HOSPITAL_COMMUNITY): Payer: Medicare Other

## 2014-02-17 ENCOUNTER — Ambulatory Visit: Payer: Medicare Other | Admitting: Internal Medicine

## 2014-02-17 ENCOUNTER — Inpatient Hospital Stay (HOSPITAL_COMMUNITY)
Admission: EM | Admit: 2014-02-17 | Discharge: 2014-02-19 | DRG: 689 | Disposition: A | Discharge status: Home Health | Payer: Medicare Other | Attending: Internal Medicine | Admitting: Internal Medicine

## 2014-02-17 ENCOUNTER — Encounter (HOSPITAL_COMMUNITY): Payer: Self-pay | Admitting: Emergency Medicine

## 2014-02-17 DIAGNOSIS — Z9189 Other specified personal risk factors, not elsewhere classified: Secondary | ICD-10-CM

## 2014-02-17 DIAGNOSIS — R21 Rash and other nonspecific skin eruption: Secondary | ICD-10-CM

## 2014-02-17 DIAGNOSIS — R4182 Altered mental status, unspecified: Secondary | ICD-10-CM | POA: Diagnosis present

## 2014-02-17 DIAGNOSIS — L501 Idiopathic urticaria: Secondary | ICD-10-CM

## 2014-02-17 DIAGNOSIS — B0229 Other postherpetic nervous system involvement: Secondary | ICD-10-CM

## 2014-02-17 DIAGNOSIS — F411 Generalized anxiety disorder: Secondary | ICD-10-CM | POA: Diagnosis present

## 2014-02-17 DIAGNOSIS — I10 Essential (primary) hypertension: Secondary | ICD-10-CM

## 2014-02-17 DIAGNOSIS — Z87891 Personal history of nicotine dependence: Secondary | ICD-10-CM

## 2014-02-17 DIAGNOSIS — M62838 Other muscle spasm: Secondary | ICD-10-CM

## 2014-02-17 DIAGNOSIS — Z79899 Other long term (current) drug therapy: Secondary | ICD-10-CM

## 2014-02-17 DIAGNOSIS — H269 Unspecified cataract: Secondary | ICD-10-CM

## 2014-02-17 DIAGNOSIS — B029 Zoster without complications: Secondary | ICD-10-CM

## 2014-02-17 DIAGNOSIS — J449 Chronic obstructive pulmonary disease, unspecified: Secondary | ICD-10-CM

## 2014-02-17 DIAGNOSIS — E860 Dehydration: Secondary | ICD-10-CM | POA: Diagnosis present

## 2014-02-17 DIAGNOSIS — I498 Other specified cardiac arrhythmias: Secondary | ICD-10-CM | POA: Diagnosis present

## 2014-02-17 DIAGNOSIS — E042 Nontoxic multinodular goiter: Secondary | ICD-10-CM

## 2014-02-17 DIAGNOSIS — Z9981 Dependence on supplemental oxygen: Secondary | ICD-10-CM

## 2014-02-17 DIAGNOSIS — R410 Disorientation, unspecified: Secondary | ICD-10-CM

## 2014-02-17 DIAGNOSIS — E559 Vitamin D deficiency, unspecified: Secondary | ICD-10-CM

## 2014-02-17 DIAGNOSIS — Z8249 Family history of ischemic heart disease and other diseases of the circulatory system: Secondary | ICD-10-CM

## 2014-02-17 DIAGNOSIS — M549 Dorsalgia, unspecified: Secondary | ICD-10-CM | POA: Diagnosis present

## 2014-02-17 DIAGNOSIS — G251 Drug-induced tremor: Secondary | ICD-10-CM

## 2014-02-17 DIAGNOSIS — J309 Allergic rhinitis, unspecified: Secondary | ICD-10-CM

## 2014-02-17 DIAGNOSIS — K219 Gastro-esophageal reflux disease without esophagitis: Secondary | ICD-10-CM | POA: Diagnosis present

## 2014-02-17 DIAGNOSIS — G8929 Other chronic pain: Secondary | ICD-10-CM | POA: Diagnosis present

## 2014-02-17 DIAGNOSIS — R5381 Other malaise: Secondary | ICD-10-CM | POA: Diagnosis present

## 2014-02-17 DIAGNOSIS — R209 Unspecified disturbances of skin sensation: Secondary | ICD-10-CM

## 2014-02-17 DIAGNOSIS — J209 Acute bronchitis, unspecified: Secondary | ICD-10-CM

## 2014-02-17 DIAGNOSIS — IMO0002 Reserved for concepts with insufficient information to code with codable children: Secondary | ICD-10-CM | POA: Diagnosis present

## 2014-02-17 DIAGNOSIS — J4489 Other specified chronic obstructive pulmonary disease: Secondary | ICD-10-CM | POA: Diagnosis present

## 2014-02-17 DIAGNOSIS — R509 Fever, unspecified: Secondary | ICD-10-CM

## 2014-02-17 DIAGNOSIS — J9610 Chronic respiratory failure, unspecified whether with hypoxia or hypercapnia: Secondary | ICD-10-CM

## 2014-02-17 DIAGNOSIS — R911 Solitary pulmonary nodule: Secondary | ICD-10-CM

## 2014-02-17 DIAGNOSIS — R5383 Other fatigue: Secondary | ICD-10-CM

## 2014-02-17 DIAGNOSIS — R112 Nausea with vomiting, unspecified: Secondary | ICD-10-CM

## 2014-02-17 DIAGNOSIS — N390 Urinary tract infection, site not specified: Principal | ICD-10-CM

## 2014-02-17 DIAGNOSIS — G9340 Encephalopathy, unspecified: Secondary | ICD-10-CM

## 2014-02-17 DIAGNOSIS — B0233 Zoster keratitis: Secondary | ICD-10-CM

## 2014-02-17 DIAGNOSIS — L299 Pruritus, unspecified: Secondary | ICD-10-CM | POA: Diagnosis present

## 2014-02-17 HISTORY — DX: Gastro-esophageal reflux disease without esophagitis: K21.9

## 2014-02-17 HISTORY — DX: Other postherpetic nervous system involvement: B02.29

## 2014-02-17 HISTORY — DX: Other chronic pain: G89.29

## 2014-02-17 HISTORY — DX: Dependence on supplemental oxygen: Z99.81

## 2014-02-17 HISTORY — DX: Low back pain: M54.5

## 2014-02-17 HISTORY — DX: Unspecified osteoarthritis, unspecified site: M19.90

## 2014-02-17 HISTORY — DX: Low back pain, unspecified: M54.50

## 2014-02-17 HISTORY — DX: Zoster without complications: B02.9

## 2014-02-17 HISTORY — DX: Pneumonia, unspecified organism: J18.9

## 2014-02-17 LAB — COMPREHENSIVE METABOLIC PANEL
ALT: 14 U/L (ref 0–35)
AST: 20 U/L (ref 0–37)
Albumin: 3.6 g/dL (ref 3.5–5.2)
Alkaline Phosphatase: 82 U/L (ref 39–117)
BUN: 20 mg/dL (ref 6–23)
CO2: 18 mEq/L — ABNORMAL LOW (ref 19–32)
Calcium: 9.8 mg/dL (ref 8.4–10.5)
Chloride: 101 mEq/L (ref 96–112)
Creatinine, Ser: 1.29 mg/dL — ABNORMAL HIGH (ref 0.50–1.10)
GFR calc Af Amer: 49 mL/min — ABNORMAL LOW (ref >90)
GFR, EST NON AFRICAN AMERICAN: 42 mL/min — AB (ref >90)
Glucose, Bld: 101 mg/dL — ABNORMAL HIGH (ref 70–99)
Potassium: 3.5 mEq/L — ABNORMAL LOW (ref 3.7–5.3)
SODIUM: 139 meq/L (ref 137–147)
Total Bilirubin: 1 mg/dL (ref 0.3–1.2)
Total Protein: 7.5 g/dL (ref 6.0–8.3)

## 2014-02-17 LAB — URINE MICROSCOPIC-ADD ON

## 2014-02-17 LAB — URINALYSIS, ROUTINE W REFLEX MICROSCOPIC
GLUCOSE, UA: NEGATIVE mg/dL
HGB URINE DIPSTICK: NEGATIVE
Ketones, ur: 15 mg/dL — AB
Nitrite: NEGATIVE
PH: 6 (ref 5.0–8.0)
PROTEIN: 30 mg/dL — AB
Specific Gravity, Urine: 1.023 (ref 1.005–1.030)
Urobilinogen, UA: 4 mg/dL — ABNORMAL HIGH (ref 0.0–1.0)

## 2014-02-17 LAB — I-STAT TROPONIN, ED: Troponin i, poc: 0 ng/mL (ref 0.00–0.08)

## 2014-02-17 LAB — CREATININE, SERUM
CREATININE: 1.22 mg/dL — AB (ref 0.50–1.10)
GFR calc Af Amer: 52 mL/min — ABNORMAL LOW (ref >90)
GFR calc non Af Amer: 45 mL/min — ABNORMAL LOW (ref >90)

## 2014-02-17 LAB — CBC
HCT: 44 % (ref 36.0–46.0)
HCT: 44.1 % (ref 36.0–46.0)
HEMOGLOBIN: 15.4 g/dL — AB (ref 12.0–15.0)
Hemoglobin: 15.7 g/dL — ABNORMAL HIGH (ref 12.0–15.0)
MCH: 32.5 pg (ref 26.0–34.0)
MCH: 32.1 pg (ref 26.0–34.0)
MCHC: 35.7 g/dL (ref 30.0–36.0)
MCHC: 34.9 g/dL (ref 30.0–36.0)
MCV: 91.1 fL (ref 78.0–100.0)
MCV: 91.9 fL (ref 78.0–100.0)
PLATELETS: 300 10*3/uL (ref 150–400)
Platelets: 286 10*3/uL (ref 150–400)
RBC: 4.83 MIL/uL (ref 3.87–5.11)
RBC: 4.8 MIL/uL (ref 3.87–5.11)
RDW: 13.2 % (ref 11.5–15.5)
RDW: 13.2 % (ref 11.5–15.5)
WBC: 13.1 10*3/uL — AB (ref 4.0–10.5)
WBC: 14.2 10*3/uL — ABNORMAL HIGH (ref 4.0–10.5)

## 2014-02-17 LAB — I-STAT CG4 LACTIC ACID, ED: Lactic Acid, Venous: 0.97 mmol/L (ref 0.5–2.2)

## 2014-02-17 MED ORDER — LORATADINE 10 MG PO TABS
10.0000 mg | ORAL_TABLET | Freq: Every day | ORAL | Status: DC
  Filled 2014-02-17 (×3): qty 1

## 2014-02-17 MED ORDER — VERAPAMIL HCL ER 180 MG PO TBCR
180.0000 mg | EXTENDED_RELEASE_TABLET | Freq: Every day | ORAL | Status: DC
  Administered 2014-02-17 – 2014-02-18 (×2): 180 mg via ORAL
  Filled 2014-02-17 (×3): qty 1

## 2014-02-17 MED ORDER — THIAMINE HCL 100 MG/ML IJ SOLN
100.0000 mg | Freq: Every day | INTRAMUSCULAR | Status: DC
  Administered 2014-02-18: 100 mg via INTRAVENOUS
  Filled 2014-02-17 (×3): qty 1

## 2014-02-17 MED ORDER — VITAMIN D3 25 MCG (1000 UNIT) PO TABS
1000.0000 [IU] | ORAL_TABLET | Freq: Every day | ORAL | Status: DC
  Administered 2014-02-17 – 2014-02-19 (×3): 1000 [IU] via ORAL
  Filled 2014-02-17 (×3): qty 1

## 2014-02-17 MED ORDER — IBUPROFEN 600 MG PO TABS
600.0000 mg | ORAL_TABLET | Freq: Four times a day (QID) | ORAL | Status: DC | PRN
  Administered 2014-02-17: 600 mg via ORAL
  Filled 2014-02-17: qty 1

## 2014-02-17 MED ORDER — DEXTROSE 5 % IV SOLN
1.0000 g | INTRAVENOUS | Status: DC
  Administered 2014-02-18: 1 g via INTRAVENOUS
  Filled 2014-02-17 (×2): qty 10

## 2014-02-17 MED ORDER — PREDNISONE 5 MG PO TABS
5.0000 mg | ORAL_TABLET | Freq: Every day | ORAL | Status: DC
  Administered 2014-02-18 – 2014-02-19 (×2): 5 mg via ORAL
  Filled 2014-02-17 (×3): qty 1

## 2014-02-17 MED ORDER — ACETAMINOPHEN 650 MG RE SUPP: 650.0000 mg | Freq: Four times a day (QID) | RECTAL | Status: DC | PRN

## 2014-02-17 MED ORDER — ALBUTEROL SULFATE (2.5 MG/3ML) 0.083% IN NEBU
2.5000 mg | INHALATION_SOLUTION | Freq: Four times a day (QID) | RESPIRATORY_TRACT | Status: DC | PRN
  Administered 2014-02-19: 2.5 mg via RESPIRATORY_TRACT
  Filled 2014-02-17: qty 3

## 2014-02-17 MED ORDER — CALCIUM CARBONATE-VITAMIN D 500-200 MG-UNIT PO TABS
1.0000 | ORAL_TABLET | Freq: Every day | ORAL | Status: DC
  Administered 2014-02-17 – 2014-02-19 (×3): 1 via ORAL
  Filled 2014-02-17 (×3): qty 1

## 2014-02-17 MED ORDER — HEPARIN SODIUM (PORCINE) 5000 UNIT/ML IJ SOLN
5000.0000 [IU] | Freq: Three times a day (TID) | INTRAMUSCULAR | Status: DC
  Administered 2014-02-17 – 2014-02-19 (×6): 5000 [IU] via SUBCUTANEOUS
  Filled 2014-02-17 (×9): qty 1

## 2014-02-17 MED ORDER — BIOTENE DRY MOUTH MT LIQD
15.0000 mL | Freq: Two times a day (BID) | OROMUCOSAL | Status: DC
  Administered 2014-02-17 – 2014-02-18 (×3): 15 mL via OROMUCOSAL

## 2014-02-17 MED ORDER — ONDANSETRON HCL 4 MG/2ML IJ SOLN: 4.0000 mg | Freq: Four times a day (QID) | INTRAMUSCULAR | Status: DC | PRN

## 2014-02-17 MED ORDER — LORAZEPAM 1 MG PO TABS: 1.0000 mg | ORAL_TABLET | Freq: Four times a day (QID) | ORAL | Status: DC | PRN

## 2014-02-17 MED ORDER — IPRATROPIUM-ALBUTEROL 0.5-2.5 (3) MG/3ML IN SOLN
3.0000 mL | Freq: Once | RESPIRATORY_TRACT | Status: AC
  Administered 2014-02-17: 3 mL via RESPIRATORY_TRACT
  Filled 2014-02-17: qty 3

## 2014-02-17 MED ORDER — ALUM & MAG HYDROXIDE-SIMETH 200-200-20 MG/5ML PO SUSP: 30.0000 mL | Freq: Four times a day (QID) | ORAL | Status: DC | PRN

## 2014-02-17 MED ORDER — IPRATROPIUM-ALBUTEROL 0.5-2.5 (3) MG/3ML IN SOLN
3.0000 mL | Freq: Three times a day (TID) | RESPIRATORY_TRACT | Status: DC
  Administered 2014-02-17: 3 mL via RESPIRATORY_TRACT
  Filled 2014-02-17: qty 3

## 2014-02-17 MED ORDER — TIOTROPIUM BROMIDE MONOHYDRATE 18 MCG IN CAPS
18.0000 ug | ORAL_CAPSULE | Freq: Every day | RESPIRATORY_TRACT | Status: DC
  Administered 2014-02-17: 18 ug via RESPIRATORY_TRACT
  Filled 2014-02-17: qty 5

## 2014-02-17 MED ORDER — LORAZEPAM 2 MG/ML IJ SOLN: 1.0000 mg | Freq: Four times a day (QID) | INTRAMUSCULAR | Status: DC | PRN

## 2014-02-17 MED ORDER — ONDANSETRON HCL 4 MG PO TABS: 4.0000 mg | ORAL_TABLET | Freq: Four times a day (QID) | ORAL | Status: DC | PRN

## 2014-02-17 MED ORDER — FOLIC ACID 1 MG PO TABS
1.0000 mg | ORAL_TABLET | Freq: Every day | ORAL | Status: DC
  Administered 2014-02-17 – 2014-02-19 (×3): 1 mg via ORAL
  Filled 2014-02-17 (×3): qty 1

## 2014-02-17 MED ORDER — ADULT MULTIVITAMIN W/MINERALS CH
1.0000 | ORAL_TABLET | Freq: Every day | ORAL | Status: DC
  Administered 2014-02-17 – 2014-02-19 (×3): 1 via ORAL
  Filled 2014-02-17 (×3): qty 1

## 2014-02-17 MED ORDER — IPRATROPIUM-ALBUTEROL 0.5-2.5 (3) MG/3ML IN SOLN
3.0000 mL | Freq: Two times a day (BID) | RESPIRATORY_TRACT | Status: DC
  Administered 2014-02-18: 3 mL via RESPIRATORY_TRACT
  Filled 2014-02-17: qty 3

## 2014-02-17 MED ORDER — ALBUTEROL SULFATE HFA 108 (90 BASE) MCG/ACT IN AERS: 2.0000 | INHALATION_SPRAY | Freq: Four times a day (QID) | RESPIRATORY_TRACT | Status: DC | PRN

## 2014-02-17 MED ORDER — SODIUM CHLORIDE 0.9 % IV SOLN
INTRAVENOUS | Status: DC
  Administered 2014-02-17 (×2): via INTRAVENOUS

## 2014-02-17 MED ORDER — PANTOPRAZOLE SODIUM 40 MG PO TBEC
80.0000 mg | DELAYED_RELEASE_TABLET | Freq: Every day | ORAL | Status: DC
  Administered 2014-02-17 – 2014-02-19 (×3): 80 mg via ORAL
  Filled 2014-02-17 (×3): qty 2

## 2014-02-17 MED ORDER — ACETAMINOPHEN 325 MG PO TABS: 650.0000 mg | ORAL_TABLET | Freq: Four times a day (QID) | ORAL | Status: DC | PRN

## 2014-02-17 MED ORDER — MOMETASONE FURO-FORMOTEROL FUM 200-5 MCG/ACT IN AERO
2.0000 | INHALATION_SPRAY | Freq: Two times a day (BID) | RESPIRATORY_TRACT | Status: DC
  Administered 2014-02-17 – 2014-02-18 (×3): 2 via RESPIRATORY_TRACT
  Filled 2014-02-17: qty 8.8

## 2014-02-17 MED ORDER — CHLORHEXIDINE GLUCONATE 0.12 % MT SOLN
15.0000 mL | Freq: Two times a day (BID) | OROMUCOSAL | Status: DC
Start: 2014-02-17 — End: 2014-02-19
  Administered 2014-02-17 – 2014-02-19 (×4): 15 mL via OROMUCOSAL
  Filled 2014-02-17 (×6): qty 15

## 2014-02-17 MED ORDER — DEXTROSE 5 % IV SOLN
1.0000 g | Freq: Once | INTRAVENOUS | Status: AC
  Administered 2014-02-17: 1 g via INTRAVENOUS
  Filled 2014-02-17: qty 10

## 2014-02-17 MED ORDER — VITAMIN B-1 100 MG PO TABS
100.0000 mg | ORAL_TABLET | Freq: Every day | ORAL | Status: DC
  Administered 2014-02-18 – 2014-02-19 (×2): 100 mg via ORAL
  Filled 2014-02-17 (×3): qty 1

## 2014-02-17 NOTE — H&P (Addendum)
PATIENT DETAILS Name: [name] Age: 68 y.o. Sex: female Date of Birth: [birth year] Admit Date: 02/17/2014 ZOX:WRUE Plotnikov, MD   CHIEF COMPLAINT:   weakness and altered mental status  HPI: [name] is a 68 y.o. 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 Patient in April had left facial herpetic eruption, subsequently developed painful herpetic neuralgia and was placed on Neurontin and narcotics. She was doing well until this past weekend, when on Sunday she was noted to have weakness and confusion. Along with Neurontin, patient was also narcotics and Valium, patient was also taking hydroxyzine as needed for itching. Suspecting that polypharmacy was playing a role, the patient's husband did not give her any further narcotics and Neurontin. However for the past few days her confusion and weakness worsened, patient required almost complete assistance from the patient's husband to get around the house. There was no fever, vomiting or headache. Denies any photophobia. There is no history of vomiting diarrhea or abdominal pain. Since husband also noted that along with the weakness, patient's appetite had significantly decreased. As a result EMS was called, per RN note-pupils were 3 mm and reactive, EMS gave 1 mg of Narcan, following which patient became much more alert and awake. During my evaluation, the patient's husband claims that the patient is almost back to her usual baseline. Patient is completely alert and awake and follows all my commands, and answers all my questions appropriately. She is now being admitted for further evaluation and treatment.   ALLERGIES:   Allergies  Allergen Reactions  . Tramadol     memory issues    PAST MEDICAL HISTORY: Past Medical History  Diagnosis Date  .  COPD (chronic obstructive pulmonary disease)   . Anxiety   . Hypertension   . Allergic rhinitis   . Goiter   . Asthma   . Shingles     PAST SURGICAL HISTORY: Past Surgical History  Procedure Laterality Date  . Tubal ligation    . Tonsillectomy    . Wisdom tooth extraction    . Thyroidectomy  12/29/08    MEDICATIONS AT HOME: Prior to Admission medications   Medication Sig Start Date End Date Taking? Authorizing Provider  albuterol (PROAIR HFA) 108 (90 BASE) MCG/ACT inhaler Inhale 2 puffs into the lungs every 6 (six) hours as needed for wheezing or shortness of breath.   Yes Historical Provider, MD  Calcium Carbonate-Vitamin D (CALCIUM-VITAMIN D) 500-200 MG-UNIT per tablet Take 1 tablet by mouth daily.    Yes Historical Provider, MD  cetirizine (ZYRTEC) 10 MG tablet Take 10 mg by mouth daily as needed for allergies. Take 1 at bedtime as needed   Yes Historical Provider, MD  cholecalciferol (VITAMIN D) 1000 UNITS tablet Take 1,000 Units by mouth daily.    Yes Historical Provider, MD  diazepam (VALIUM) 5 MG tablet Take 5 mg by mouth every 6 (six) hours as needed for anxiety.   Yes Historical Provider, MD  gabapentin (NEURONTIN) 100 MG capsule Take 100 mg by mouth daily.   Yes Historical Provider, MD  hydrOXYzine (ATARAX/VISTARIL) 25 MG tablet Take 1 tablet (25 mg total) by mouth every 8 (eight) hours as needed for itching.   Yes Tresa Garter, MD  ibuprofen (ADVIL,MOTRIN) 200 MG tablet Take 600 mg by mouth every 6 (six)  hours as needed for moderate pain.    Yes Historical Provider, MD  mometasone-formoterol (DULERA) 200-5 MCG/ACT AERO Inhale 2 puffs into the lungs 2 (two) times daily.   Yes Historical Provider, MD  NON FORMULARY Oxygen  1.5 L at bedtime   Yes Historical Provider, MD  omeprazole (PRILOSEC) 40 MG capsule Take 40 mg by mouth daily.  02/19/13  Yes Nyoka CowdenMichael B Wert, MD  Oxycodone HCl 10 MG TABS Take 10 mg by mouth daily.   Yes Historical Provider, MD  predniSONE (DELTASONE)  5 MG tablet Take 5 mg by mouth daily with breakfast.   Yes Historical Provider, MD  tiotropium (SPIRIVA HANDIHALER) 18 MCG inhalation capsule Place 1 capsule (18 mcg total) into inhaler and inhale daily. (PLAN A) 12/02/12  Yes Nyoka CowdenMichael B Wert, MD  verapamil (VERELAN PM) 180 MG 24 hr capsule Take 180 mg by mouth at bedtime.   Yes Historical Provider, MD    FAMILY HISTORY: Family History  Problem Relation Age of Onset  . Hypertension Father     SOCIAL HISTORY:  reports that she quit smoking about 8 years ago. Her smoking use included Cigarettes. She has a 40 pack-year smoking history. She has never used smokeless tobacco. She reports that she drinks alcohol. She reports that she does not use illicit drugs.  REVIEW OF SYSTEMS:  Constitutional:   No  weight loss, night sweats,  Fevers, chills  HEENT:    No headaches, Difficulty swallowing,Tooth/dental problems,Sore throat,  No sneezing, itching, ear ache, nasal congestion, post nasal drip,   Cardio-vascular: No chest pain,  Orthopnea, PND, swelling in lower extremities, anasarca,   dizziness, palpitations  GI:  No heartburn, indigestion, abdominal pain, nausea, vomiting, diarrhea, change in bowel habits, loss of appetite  Resp: No shortness of breath with exertion or at rest.  No excess mucus, no productive cough, No non-productive cough,  No coughing up of blood.No change in color of mucus.No wheezing.No chest wall deformity  Skin:  no rash or lesions.  GU:  no dysuria, change in color of urine, no urgency or frequency.  No flank pain.  Musculoskeletal: No joint pain or swelling.  No decreased range of motion.  No back pain.  Psych: No change in mood or affect. No depression or anxiety.  No memory loss.   PHYSICAL EXAM: Blood pressure 164/80, pulse 101, temperature 98.4 F (36.9 C), temperature source Oral, resp. rate 29, height 5\' 1"  (1.549 m), weight 60.328 kg (133 lb), SpO2 96.00%.  General appearance :Awake, alert, not  in any distress. Speech Clear. Not toxic Looking. HEENT: Atraumatic and Normocephalic, pupils equally reactive to light and accomodation Neck: supple, no JVD. No cervical lymphadenopathy.  Chest:Good air entry bilaterally, no added sounds  CVS: S1 S2 regular, no murmurs.  Abdomen: Bowel sounds present, Non tender and not distended with no gaurding, rigidity or rebound. Extremities: B/L Lower Ext shows no edema, both legs are warm to touch Neurology: Awake alert, and oriented X 3, CN II-XII intact, Non focal Skin:No Rash Wounds:N/A  LABS ON ADMISSION:   Recent Labs  02/17/14 1107  NA 139  K 3.5*  CL 101  CO2 18*  GLUCOSE 101*  BUN 20  CREATININE 1.29*  CALCIUM 9.8    Recent Labs  02/17/14 1107  AST 20  ALT 14  ALKPHOS 82  BILITOT 1.0  PROT 7.5  ALBUMIN 3.6   No results found for this basename: LIPASE, AMYLASE,  in the last 72 hours  Recent Labs  02/17/14  1107  WBC 13.1*  HGB 15.7*  HCT 44.0  MCV 91.1  PLT 300   No results found for this basename: CKTOTAL, CKMB, CKMBINDEX, TROPONINI,  in the last 72 hours No results found for this basename: DDIMER,  in the last 72 hours No components found with this basename: POCBNP,    RADIOLOGIC STUDIES ON ADMISSION: Dg Chest 2 View  02/17/2014   CLINICAL DATA:  Shortness of breath and weakness.  EXAM: CHEST  2 VIEW  COMPARISON:  PA and lateral chest 08/20/2013.  CT chest 03/06/2013.  FINDINGS: The lungs are emphysematous but clear. Heart size is normal. No pneumothorax or pleural effusion. Mild superior endplate compression fracture at the thoracolumbar junction is unchanged.  IMPRESSION: Emphysema without acute disease.   Electronically Signed   By: Drusilla Kannerhomas  Dalessio M.D.   On: 02/17/2014 11:32   Ct Head Wo Contrast  02/17/2014   CLINICAL DATA:  Weakness  EXAM: CT HEAD WITHOUT CONTRAST  TECHNIQUE: Contiguous axial images were obtained from the base of the skull through the vertex without intravenous contrast.  COMPARISON:   None.  FINDINGS: Generalized atrophy. Mild chronic microvascular ischemic change in the white matter.  Negative for acute infarct, hemorrhage, or mass lesion.  IMPRESSION: No acute abnormality.   Electronically Signed   By: Marlan Palauharles  Clark M.D.   On: 02/17/2014 13:23     EKG: Independently reviewed. Sinus tachycardia  ASSESSMENT AND PLAN: Present on Admission:  . Acute encephalopathy - Suspected secondary to polypharmacy and dehydration. Significant improvement after given Narcan by EMS. - Will admit, hold all narcotics, benzodiazepines and Neurontin for now. Hydrate and follow clinical course.  Marland Kitchen.  UTI (lower urinary tract infection) - Continue Rocephin, await cultures. Suspect encephalopathy more from polypharmacy been UTI.  Marland Kitchen. COPD - Lungs clear, continue with inhaler regimen and nebulized bronchodilators  . Chronic hypoxic respiratory failure - On home O2- continue  . Hypertension - Continue verapamil  . GERD - Continue PPI  Further plan will depend as patient's clinical course evolves and further radiologic and laboratory data become available. Patient will be monitored closely.  Above noted plan was discussed with patient/spouse, they were in agreement.   DVT Prophylaxis: Prophylactic Lovenox  Code Status: Full Code  Total time spent for admission equals 45 minutes.  Maretta BeesShanker M Ghimire [facility] Pager [phone]  If 7PM-7AM, please contact night-coverage [email or web address] Password [email or web address] 02/17/2014, 2:12 PM

## 2014-02-17 NOTE — ED Notes (Signed)
Pt's husband states that pt had a very similar situation in October when pt was on tramadol. Pt became "spacy" and weak and they stopped the medication. Pt was better within a week.

## 2014-02-17 NOTE — ED Notes (Signed)
Patient transported to X-ray 

## 2014-02-17 NOTE — ED Provider Notes (Signed)
CSN: [id number]     Arrival date & time    History   First MD Initiated Contact with Patient 02/17/14 1014     Chief Complaint  Patient presents with  . Weakness     (Consider location/radiation/quality/duration/timing/severity/associated sxs/prior Treatment) HPI Comments: Confusion at home with husband, progressively worsening over past few days. Associated decreased appetite. Denies abdominal pain, chest pain, N/V/D.  Patient is a 68 y.o. female presenting with altered mental status. The history is provided by the patient.  Altered Mental Status Presenting symptoms: confusion and disorientation   Presenting symptoms: no lethargy, no partial responsiveness and no unresponsiveness   Severity:  Moderate Most recent episode:  Today Episode history:  Multiple Timing:  Constant Progression:  Worsening Chronicity:  New Context: not drug use, not head injury, not a nursing home resident, not a recent illness and not a recent infection   Associated symptoms: no abdominal pain, no fever and no vomiting     Past Medical History  Diagnosis Date  . COPD (chronic obstructive pulmonary disease)   . Anxiety   . Hypertension   . Allergic rhinitis   . Goiter   . Asthma   . Shingles    Past Surgical History  Procedure Laterality Date  . Tubal ligation    . Tonsillectomy    . Wisdom tooth extraction    . Thyroidectomy  12/29/08   Family History  Problem Relation Age of Onset  . Hypertension Father    History  Substance Use Topics  . Smoking status: Former Smoker -- 1.00 packs/day for 40 years    Types: Cigarettes    Quit date: 04/01/2005  . Smokeless tobacco: Never Used  . Alcohol Use: Yes     Comment: rare   OB History   Grav Para Term Preterm Abortions TAB SAB Ect Mult Living                 Review of Systems  Constitutional: Negative for fever.  Respiratory: Negative for cough and shortness of breath.   Gastrointestinal: Negative for vomiting, abdominal pain and  diarrhea.  Psychiatric/Behavioral: Positive for confusion.  All other systems reviewed and are negative.     Allergies  Tramadol  Home Medications   Prior to Admission medications   Medication Sig Start Date End Date Taking? Authorizing Provider  albuterol (PROAIR HFA) 108 (90 BASE) MCG/ACT inhaler Inhale 2 puffs into the lungs every 6 (six) hours as needed for wheezing or shortness of breath.   Yes Historical Provider, MD  Calcium Carbonate-Vitamin D (CALCIUM-VITAMIN D) 500-200 MG-UNIT per tablet Take 1 tablet by mouth daily.    Yes Historical Provider, MD  cetirizine (ZYRTEC) 10 MG tablet Take 10 mg by mouth daily as needed for allergies. Take 1 at bedtime as needed   Yes Historical Provider, MD  cholecalciferol (VITAMIN D) 1000 UNITS tablet Take 1,000 Units by mouth daily.    Yes Historical Provider, MD  diazepam (VALIUM) 5 MG tablet Take 5 mg by mouth every 6 (six) hours as needed for anxiety.   Yes Historical Provider, MD  gabapentin (NEURONTIN) 100 MG capsule Take 100 mg by mouth daily.   Yes Historical Provider, MD  hydrOXYzine (ATARAX/VISTARIL) 25 MG tablet Take 1 tablet (25 mg total) by mouth every 8 (eight) hours as needed for itching.   Yes Tresa Garter, MD  ibuprofen (ADVIL,MOTRIN) 200 MG tablet Take 600 mg by mouth every 6 (six) hours as needed for moderate pain.    Yes  Historical Provider, MD  mometasone-formoterol (DULERA) 200-5 MCG/ACT AERO Inhale 2 puffs into the lungs 2 (two) times daily.   Yes Historical Provider, MD  NON FORMULARY Oxygen  1.5 L at bedtime   Yes Historical Provider, MD  omeprazole (PRILOSEC) 40 MG capsule Take 40 mg by mouth daily.  02/19/13  Yes Nyoka CowdenMichael B Wert, MD  Oxycodone HCl 10 MG TABS Take 10 mg by mouth daily.   Yes Historical Provider, MD  predniSONE (DELTASONE) 5 MG tablet Take 5 mg by mouth daily with breakfast.   Yes Historical Provider, MD  tiotropium (SPIRIVA HANDIHALER) 18 MCG inhalation capsule Place 1 capsule (18 mcg total) into  inhaler and inhale daily. (PLAN A) 12/02/12  Yes Nyoka CowdenMichael B Wert, MD  verapamil (VERELAN PM) 180 MG 24 hr capsule Take 180 mg by mouth at bedtime.   Yes Historical Provider, MD   BP 156/97  Pulse 96  Temp(Src) 98.4 F (36.9 C) (Oral)  Resp 29  Ht 5\' 1"  (1.549 m)  Wt 133 lb (60.328 kg)  BMI 25.14 kg/m2  SpO2 98% Physical Exam  Nursing note and vitals reviewed. Constitutional: She is oriented to person, place, and time. She appears well-developed and well-nourished. No distress.  HENT:  Head: Normocephalic and atraumatic.  Mouth/Throat: Oropharynx is clear and moist. No oropharyngeal exudate.  Eyes: EOM are normal. Pupils are equal, round, and reactive to light.  Neck: Normal range of motion. Neck supple.  Cardiovascular: Normal rate and regular rhythm.  Exam reveals no friction rub.   No murmur heard. Pulmonary/Chest: Breath sounds normal. She is in respiratory distress (mild pursed lip breathing). She has no wheezes. She has no rales.  Abdominal: Soft. She exhibits no distension. There is no tenderness. There is no rebound.  Musculoskeletal: Normal range of motion. She exhibits no edema.  Neurological: She is alert and oriented to person, place, and time. She exhibits normal muscle tone.  Skin: No rash noted. She is not diaphoretic.    ED Course  Procedures (including critical care time) Labs Review Labs Reviewed  CBC - Abnormal; Notable for the following:    WBC 13.1 (*)    Hemoglobin 15.7 (*)    All other components within normal limits  COMPREHENSIVE METABOLIC PANEL  URINALYSIS, ROUTINE W REFLEX MICROSCOPIC  I-STAT CG4 LACTIC ACID, ED  Rosezena SensorI-STAT TROPOININ, ED    Imaging Review Dg Chest 2 View  02/17/2014   CLINICAL DATA:  Shortness of breath and weakness.  EXAM: CHEST  2 VIEW  COMPARISON:  PA and lateral chest 08/20/2013.  CT chest 03/06/2013.  FINDINGS: The lungs are emphysematous but clear. Heart size is normal. No pneumothorax or pleural effusion. Mild superior endplate  compression fracture at the thoracolumbar junction is unchanged.  IMPRESSION: Emphysema without acute disease.   Electronically Signed   By: Drusilla Kannerhomas  Dalessio M.D.   On: 02/17/2014 11:32     EKG Interpretation   Date/Time:  Wednesday Feb 17 2014 09:38:02 EDT Ventricular Rate:  104 PR Interval:  129 QRS Duration: 72 QT Interval:  423 QTC Calculation: 556 R Axis:   63 Text Interpretation:  Sinus tachycardia Ventricular premature complex  Right atrial enlargement Borderline repolarization abnormality Prolonged  QT interval Similar EKG to prior Confirmed by Gwendolyn GrantWALDEN  MD, Miron Marxen ([id number]) on  02/17/2014 11:24:09 AM      MDM   Final diagnoses:  UTI (urinary tract infection)  Delirium  Confusion    67 rolled female with recent zoster infection presents with altered mental status. Some  confusion per the husband. Is progressively worsening over the past 3-4 days. No fever, but decreased appetite. Denies any falls. No vomiting or diarrhea. No chest pain or shortness of breath. On exam, she is tachypneic and is having pursed lip breathing. No chest or abdominal pain. Mucus membranes are tacky, concern for dehydration. Also recently had prednisone burst - on chronic prednisone for asthma, but increased for recent shingles infection.  Oriented with me - no confusion. Concern for delirium. Will check labs, urine, cardiac workup. Labs show UTI. Patient given Rocephin, admitted to medicine.  Dagmar HaitWilliam Datrell Dunton, MD 02/17/14 [phone]

## 2014-02-17 NOTE — Progress Notes (Signed)
Pt assignmeent received at 1352. Report called at 1400.  Pt admitted to the unit at 1435. Pt mental status is A+O. Pt oriented to room, staff, and call bell.  Full assessment charted in [facility]. Call bell within reach. Visitor guidelines reviewed w/ pt and/or family.

## 2014-02-17 NOTE — ED Notes (Signed)
NOTIFIED DR. Gwendolyn GrantWALDEN IN PERSON FOR LAB RESULTS OF CG4+ LACTIC ACID ,@11 :30 AM ,02/17/2014.

## 2014-02-17 NOTE — ED Notes (Signed)
Pt has been getting increasingly weak for approx. 3-5 days. Pt needing more help to get out of bed. Pt has had decrease in food and water intake. Pt has been on oxycodone and gabapentin for pain. Husband is worried she may be accidentally taking more than she is supposed to. Pt has also been confused at home lately. EMS states pt's pupils were 3mm and nonreactive, but pt was alert. EMS gave 1mg  Narcan and pt became more alert and pupils were reactive. BP 128/72, HR 90's, CBG 102. Pt is currently alert and oriented.

## 2014-02-18 DIAGNOSIS — R509 Fever, unspecified: Secondary | ICD-10-CM | POA: Diagnosis present

## 2014-02-18 MED ORDER — BOOST / RESOURCE BREEZE PO LIQD
1.0000 | Freq: Two times a day (BID) | ORAL | Status: DC
  Administered 2014-02-18 – 2014-02-19 (×2): 1 via ORAL

## 2014-02-18 MED ORDER — GABAPENTIN 100 MG PO CAPS
100.0000 mg | ORAL_CAPSULE | Freq: Every day | ORAL | Status: DC
  Filled 2014-02-18: qty 1

## 2014-02-18 MED ORDER — HYDROXYZINE HCL 25 MG PO TABS: 25.0000 mg | ORAL_TABLET | Freq: Three times a day (TID) | ORAL | Status: DC | PRN

## 2014-02-18 NOTE — Progress Notes (Signed)
PROGRESS NOTE  [name] ZOX:[id number] DOB: Nov 20, 1945 DOA: 02/17/2014 PCP: Sonda Primes, MD   [name] is a 68 y.o. 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 Patient in April had left facial herpetic eruption, subsequently developed painful herpetic neuralgia and was placed on Neurontin and narcotics. She was doing well until this past weekend, when on Sunday she was noted to have weakness and confusion. Along with Neurontin, patient was also narcotics and Valium, patient was also taking hydroxyzine as needed for itching. Suspecting that polypharmacy was playing a role, the patient\'s husband did not give her any further narcotics and Neurontin. However for the past few days her confusion and weakness worsened, patient required almost complete assistance from the patient\'s husband to get around the house. Husband also noted that along with the weakness, patient\'s appetite had significantly decreased. As a result EMS was called, per RN note-pupils were 3 mm and reactive, EMS gave 1 mg of Narcan, following which patient became much more alert and awake. During my evaluation, the patient\'s husband claims that the patient is almost back to her usual baseline.   Assessment/Plan:  . Acute encephalopathy  - Suspected secondary to polypharmacy and dehydration. On 5/7 the patient is completely alert and orientated. - Continue to hold all narcotics, benzodiazepines and Neurontin for now. Will add back hydroxyzine as the patient is complaining of itch. Continue to follow clinical status, if mental status worsens we will check MRI brain and EEG-however it is felt that it is not needed as of now.   Fever -101.5 T max on 5/6 after admission.   -Uncertain etiology.  UTI?  Patient has chronic leukocytosis from daily  prednisone (COPD gold) -Patient had been empirically placed on Rocephin for UTI on 5/6 -Blood cultures were drawn 5/7 am.  . UTI (lower urinary tract infection)  - Continue Rocephin, await cultures.  Unfortunately culture needed to be re-ordered on 5/7.  Requested lab to use previously collected urine. - Suspect encephalopathy more from polypharmacy been UTI.   . COPD  - Lungs clear, continue with inhaler regimen and nebulized bronchodilators and chronic prednisone.  . Chronic hypoxic respiratory failure  - On home O2 at night - continue   . Hypertension  - Continue verapamil  - BP stable.  . GERD  - Continue PPI  ? Alcohol use - Patient apparently has a "large scotch"at night on a daily basis. Last drink was this past Saturday. - Ativan per CIWA protocol. Mental status significantly improved and on admission.  DVT Prophylaxis:  Heparin  Code Status: full Family Communication: husband at bedside. Disposition Plan: to home when appropriate.   Consultants:    Procedures:    Antibiotics: Anti-infectives   Start     Dose/Rate Route Frequency Ordered Stop   02/18/14 1300  cefTRIAXone (ROCEPHIN) 1 g in dextrose 5 % 50 mL IVPB     1 g 100 mL/hr over 30 Minutes Intravenous Every 24 hours 02/17/14 1453     05 /06/15 1300  cefTRIAXone (ROCEPHIN) 1 g in dextrose 5 % 50 mL IVPB     1 g 100 mL/hr over 30 Minutes Intravenous  Once 02/17/14 1258 02/17/14 1408        HPI/Subjective: Patient reports feeling fine other than her chronic back pain.    Objective: Filed Vitals:   02/18/14  0022 02/18/14 0548 02/18/14 0857 02/18/14 0951  BP:  122/80    Pulse:  85  108  Temp: 98.8 F (37.1 C) 98 F (36.7 C)    TempSrc: Oral Oral    Resp:  20    Height:      Weight:  58.877 kg (129 lb 12.8 oz)    SpO2:  93% 94% 89%    Intake/Output Summary (Last 24 hours) at 02/18/14 1054 Last data filed at 02/18/14 0553  Gross per 24 hour  Intake      0 ml  Output    250 ml  Net    -250 ml   Filed Weights   02/17/14 0945 02/18/14 0548  Weight: 60.328 kg (133 lb) 58.877 kg (129 lb 12.8 oz)    Exam: General: Well developed, well nourished, NAD, appears stated age. HEENT:  Pupils 1mm equal, round, but not reactive to light.  Lips and tongue appear reddened and scaly due to recent Zoster. Neck: no JVD, no masses, no meningeal signs. Easily flexes and extends neck. Cardiovascular: RRR, S1 S2 auscultated, no rubs, murmurs or gallops.   Respiratory: Clear to auscultation bilaterally with equal chest rise  Abdomen: Soft, nontender, nondistended, + bowel sounds  Extremities: warm dry without cyanosis clubbing or edema.  Neuro: AAOx3, cranial nerves grossly intact. Strength 5/5 in upper and lower extremities  Skin: Without rashes exudates or nodules.   Psych: Normal affect and demeanor with intact judgement and insight    Data Reviewed: Basic Metabolic Panel:  Recent Labs Lab 02/17/14 1107 02/17/14 1650  NA 139  --   K 3.5*  --   CL 101  --   CO2 18*  --   GLUCOSE 101*  --   BUN 20  --   CREATININE 1.29* 1.22*  CALCIUM 9.8  --    Liver Function Tests:  Recent Labs Lab 02/17/14 1107  AST 20  ALT 14  ALKPHOS 82  BILITOT 1.0  PROT 7.5  ALBUMIN 3.6   CBC:  Recent Labs Lab 02/17/14 1107 02/17/14 1650  WBC 13.1* 14.2*  HGB 15.7* 15.4*  HCT 44.0 44.1  MCV 91.1 91.9  PLT 300 286     Studies: Dg Chest 2 View  02/17/2014   CLINICAL DATA:  Shortness of breath and weakness.  EXAM: CHEST  2 VIEW  COMPARISON:  PA and lateral chest 08/20/2013.  CT chest 03/06/2013.  FINDINGS: The lungs are emphysematous but clear. Heart size is normal. No pneumothorax or pleural effusion. Mild superior endplate compression fracture at the thoracolumbar junction is unchanged.  IMPRESSION: Emphysema without acute disease.   Electronically Signed   By: Drusilla Kannerhomas  Dalessio M.D.   On: 02/17/2014 11:32   Ct Head Wo Contrast  02/17/2014   CLINICAL DATA:  Weakness  EXAM: CT HEAD  WITHOUT CONTRAST  TECHNIQUE: Contiguous axial images were obtained from the base of the skull through the vertex without intravenous contrast.  COMPARISON:  None.  FINDINGS: Generalized atrophy. Mild chronic microvascular ischemic change in the white matter.  Negative for acute infarct, hemorrhage, or mass lesion.  IMPRESSION: No acute abnormality.   Electronically Signed   By: Marlan Palauharles  Clark M.D.   On: 02/17/2014 13:23    Scheduled Meds: . antiseptic oral rinse  15 mL Mouth Rinse q12n4p  . calcium-vitamin D  1 tablet Oral Daily  . cefTRIAXone (ROCEPHIN)  IV  1 g Intravenous Q24H  . chlorhexidine  15 mL Mouth Rinse BID  . cholecalciferol  1,000  Units Oral Daily  . folic acid  1 mg Oral Daily  . heparin  5,000 Units Subcutaneous 3 times per day  . loratadine  10 mg Oral Daily  . mometasone-formoterol  2 puff Inhalation BID  . multivitamin with minerals  1 tablet Oral Daily  . pantoprazole  80 mg Oral Daily  . predniSONE  5 mg Oral Q breakfast  . thiamine  100 mg Oral Daily   Or  . thiamine  100 mg Intravenous Daily  . tiotropium  18 mcg Inhalation Daily  . verapamil  180 mg Oral QHS   Continuous Infusions:   Active Problems:   HYPERTENSION   COPD GOLD II   Herpes zoster   Postherpetic neuralgia   UTI (lower urinary tract infection)   Encephalopathy acute   Acute encephalopathy   Fever, unspecified    Stephani PoliceMarianne L York, PA-C  [facility] Pager [phone]. If 7PM-7AM, please contact night-coverage at [email or web address], password [email or web address] 02/18/2014, 10:54 AM  LOS: 1 day   Attending Patient seen and examined, agree with the above assessment and plan. Completely awake and alert this morning. Liver did develop a fever last night, currently on Rocephin. Await urine and blood cultures. Neck remains supple, patient without headache. Significantly improved than on admission.Continue with current care, await culture data and PT evaluation. Spoke with Husband at bedside.  Windell NorfolkS Ghimire MD

## 2014-02-18 NOTE — Progress Notes (Signed)
UR completed. Patient changed to inpatient- requiring IV antibiotics and IVF 

## 2014-02-18 NOTE — Progress Notes (Signed)
INITIAL NUTRITION ASSESSMENT  DOCUMENTATION CODES Per approved criteria  -Not Applicable   INTERVENTION: Provide Resource breeze BID Provide Multivitamin with minerals daily Encourage adequate PO intake  NUTRITION DIAGNOSIS: Inadequate oral intake related to AMS and poor appetite as evidenced by pt eating very little for the past week per report.   Goal: Pt to meet >/= 90% of their estimated nutrition needs   Monitor:  PO intake, weight, labs  Reason for Assessment: Malnutrition Screening Tool, score of 2  68 y.o. female  Admitting Dx: <principal problem not specified>  ASSESSMENT: 68 y.o. 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   Pt's husband at bedside assisted pt in providing history. Per husband pt ate very little this past week with almost no intake for 3 days PTA. Husband purchased Ensure and was able to get pt to drink a couple within the past 3 days. Pt states she strongly dislikes Ensure. Pt describes her appetite as normal, she ate about 50% of breakfast this morning.  Husband report pt weighed 140 lbs one year ago and has had a decreased appetite (he relates to aging) for the past year causing some slow weight loss.  Weight history shows 3% weight loss in the past 3 weeks.  Labs reviewed.   Height: Ht Readings from Last 1 Encounters:  02/17/14 5\' 1"  (1.549 m)     Weight: Wt Readings from Last 1 Encounters:  02/18/14 129 lb 12.8 oz (58.877 kg)    Ideal Body Weight: 105 lbs  % Ideal Body Weight: 123%  Wt Readings from Last 10 Encounters:  02/18/14 129 lb 12.8 oz (58.877 kg)  02/01/14 129 lb (58.514 kg)  01/25/14 133 lb (60.328 kg)  01/18/14 136 lb (61.689 kg)  01/13/14 133 lb (60.328 kg)  12/31/13 136 lb 6.4 oz (61.871 kg)  12/16/13 137 lb  (62.143 kg)  10/05/13 133 lb (60.328 kg)  09/17/13 136 lb (61.689 kg)  08/20/13 136 lb (61.689 kg)    Usual Body Weight: 140 lbs  % Usual Body Weight: 92%  BMI:  Body mass index is 24.54 kg/(m^2).  Estimated Nutritional Needs: Kcal: 1500-1700 Protein: 70-80 grams Fluid: 1.5-1.7 L/day  Skin: intact  Diet Order: Cardiac  EDUCATION NEEDS: -No education needs identified at this time   Intake/Output Summary (Last 24 hours) at 02/18/14 1521 Last data filed at 02/18/14 0553  Gross per 24 hour  Intake      0 ml  Output    250 ml  Net   -250 ml    Last BM: 5/5   Labs:   Recent Labs Lab 02/17/14 1107 02/17/14 1650  NA 139  --   K 3.5*  --   CL 101  --   CO2 18*  --   BUN 20  --   CREATININE 1.29* 1.22*  CALCIUM 9.8  --   GLUCOSE 101*  --     CBG (last 3)  No results found for this basename: GLUCAP,  in the last 72 hours  Scheduled Meds: . antiseptic oral rinse  15 mL Mouth Rinse q12n4p  . calcium-vitamin D  1 tablet Oral Daily  . cefTRIAXone (ROCEPHIN)  IV  1 g Intravenous Q24H  . chlorhexidine  15 mL Mouth Rinse BID  . cholecalciferol  1,000 Units Oral Daily  .  folic acid  1 mg Oral Daily  . heparin  5,000 Units Subcutaneous 3 times per day  . loratadine  10 mg Oral Daily  . mometasone-formoterol  2 puff Inhalation BID  . multivitamin with minerals  1 tablet Oral Daily  . pantoprazole  80 mg Oral Daily  . predniSONE  5 mg Oral Q breakfast  . thiamine  100 mg Oral Daily   Or  . thiamine  100 mg Intravenous Daily  . tiotropium  18 mcg Inhalation Daily  . verapamil  180 mg Oral QHS    Continuous Infusions:   Past Medical History  Diagnosis Date  . COPD (chronic obstructive pulmonary disease)   . Anxiety   . Hypertension   . Allergic rhinitis   . Goiter   . Asthma   . Shingles   . Pneumonia 2014  . GERD (gastroesophageal reflux disease)   . Arthritis     "joints" (02/17/2014)  . Chronic lower back pain   . On home oxygen therapy     "1.5L  at night" (02/17/2014)  . Postherpetic neuralgia     "d/t shingles"    Past Surgical History  Procedure Laterality Date  . Tubal ligation    . Tonsillectomy    . Wisdom tooth extraction    . Thyroidectomy  12/29/08  . Cataract extraction w/ intraocular lens  implant, bilateral Bilateral     Ian Malkineanne Barnett RD, LDN Inpatient Clinical Dietitian Pager: [phone] After Hours Pager: [phone]

## 2014-02-18 NOTE — Evaluation (Signed)
Physical Therapy Evaluation Patient Details Name: Laura MelnickMelody [name] MRN: [id number] DOB: 01-05-1946 Today's Date: 02/18/2014   History of Present Illness  68 y.o. female with h/o COPD for which she is on nocturnal O2 admitted with UTI, weakness and AMS, likely due to polypharmacy. She was on Neurontin and narcotics for treatment of Shingles.   Clinical Impression  **Pt admitted with AMS, weakness. Dx of UTI, encephalopathy likely due to polypharmacy**. Pt currently with functional limitations due to the deficits listed below (see PT Problem List).  Pt will benefit from skilled PT to increase their independence and safety with mobility to allow discharge to the venue listed below.   Pt ambulated 30' with RW. Distance limited by SOB. SaO2 89% on RA walking, HR 108. Good progress expected. Will follow to facilitate progression of mobility with expected DC home.   *    Follow Up Recommendations Home health PT    Equipment Recommendations  None recommended by PT    Recommendations for Other Services       Precautions / Restrictions Precautions Precautions: Fall Precaution Comments: monitor O2 Restrictions Weight Bearing Restrictions: No      Mobility  Bed Mobility Overal bed mobility: Modified Independent             General bed mobility comments: HOB up 35*, used rail  Transfers Overall transfer level: Needs assistance Equipment used: Rolling walker (2 wheeled) Transfers: Sit to/from Stand Sit to Stand: Min assist         General transfer comment: min A for balance, verbal cues for hand placement  Ambulation/Gait Ambulation/Gait assistance: Min guard Ambulation Distance (Feet): 30 Feet Assistive device: Rolling walker (2 wheeled) Gait Pattern/deviations: Decreased step length - right;Decreased step length - left;Trunk flexed   Gait velocity interpretation: Below normal speed for age/gender General Gait Details: cues for positioning in RW, to increase step length,  and to lift head; 2-3/4 dyspnea noted with walking, pt stated this is baseline, SaO2 89% on RA walking, HR 108; Dyspnea resolved with 1 minute rest.   Stairs            Wheelchair Mobility    Modified Rankin (Stroke Patients Only)       Balance Overall balance assessment: Needs assistance Sitting-balance support: Feet supported;No upper extremity supported Sitting balance-Leahy Scale: Good     Standing balance support: Bilateral upper extremity supported Standing balance-Leahy Scale: Fair                               Pertinent Vitals/Pain **0/10 pain SaO2 94% on RA at rest, 89% on RA walking HR 108 walking 2-3/4 dyspnea with walking, resolved with 1 minute rest*    Home Living Family/patient expects to be discharged to:: Private residence Living Arrangements: Spouse/significant other Available Help at Discharge: Family;Available 24 hours/day Type of Home: House Home Access: Stairs to enter   Entergy CorporationEntrance Stairs-Number of Steps: 3 Home Layout: One level Home Equipment: Walker - 2 wheels;Cane - single point      Prior Function Level of Independence: Independent         Comments: works as Conservation officer, naturecashier at [employer]     Hand Dominance        Extremity/Trunk Assessment   Upper Extremity Assessment: Overall WFL for tasks assessed           Lower Extremity Assessment: Overall WFL for tasks assessed      Cervical / Trunk Assessment: Kyphotic  Communication  Communication: No difficulties  Cognition Arousal/Alertness: Awake/alert Behavior During Therapy: WFL for tasks assessed/performed Overall Cognitive Status: Within Functional Limits for tasks assessed                      General Comments      Exercises        Assessment/Plan    PT Assessment Patient needs continued PT services  PT Diagnosis Generalized weakness   PT Problem List Decreased activity tolerance;Cardiopulmonary status limiting activity;Decreased  mobility;Decreased balance;Decreased knowledge of use of DME  PT Treatment Interventions Gait training;DME instruction;Therapeutic activities;Therapeutic exercise;Patient/family education;Functional mobility training   PT Goals (Current goals can be found in the Care Plan section) Acute Rehab PT Goals Patient Stated Goal: to be able to do housework and "stuff that women have to do around the house" PT Goal Formulation: With patient/family Time For Goal Achievement: 03/04/14 Potential to Achieve Goals: Good    Frequency Min 3X/week   Barriers to discharge        Co-evaluation               End of Session Equipment Utilized During Treatment: Gait belt Activity Tolerance: Patient limited by fatigue Patient left: in bed;with family/visitor present;with call bell/phone within reach Nurse Communication: Mobility status    Functional Assessment Tool Used: clinical judgement Functional Limitation: Mobility: Walking and moving around Mobility: Walking and Moving Around Current Status ([id number]([id number]): At least 20 percent but less than 40 percent impaired, limited or restricted Mobility: Walking and Moving Around Goal Status [phone](G8979): 0 percent impaired, limited or restricted    Time: 0857-0914 PT Time Calculation (min): 17 min   Charges:   PT Evaluation $Initial PT Evaluation Tier I: 1 Procedure PT Treatments $Gait Training: 8-22 mins   PT G Codes:   Functional Assessment Tool Used: clinical judgement Functional Limitation: Mobility: Walking and moving around    Constellation EnergyJennifer K Jahmya Onofrio 02/18/2014, 9:57 AM [phone]

## 2014-02-19 DIAGNOSIS — I10 Essential (primary) hypertension: Secondary | ICD-10-CM

## 2014-02-19 LAB — BASIC METABOLIC PANEL
BUN: 16 mg/dL (ref 6–23)
CALCIUM: 9.1 mg/dL (ref 8.4–10.5)
CHLORIDE: 106 meq/L (ref 96–112)
CO2: 19 meq/L (ref 19–32)
Creatinine, Ser: 1.05 mg/dL (ref 0.50–1.10)
GFR, EST AFRICAN AMERICAN: 63 mL/min — AB (ref >90)
GFR, EST NON AFRICAN AMERICAN: 54 mL/min — AB (ref >90)
GLUCOSE: 78 mg/dL (ref 70–99)
Potassium: 3.3 mEq/L — ABNORMAL LOW (ref 3.7–5.3)
Sodium: 143 mEq/L (ref 137–147)

## 2014-02-19 LAB — CBC
HCT: 37.1 % (ref 36.0–46.0)
HEMOGLOBIN: 12.5 g/dL (ref 12.0–15.0)
MCH: 31.3 pg (ref 26.0–34.0)
MCHC: 33.7 g/dL (ref 30.0–36.0)
MCV: 92.8 fL (ref 78.0–100.0)
PLATELETS: 246 10*3/uL (ref 150–400)
RBC: 4 MIL/uL (ref 3.87–5.11)
RDW: 13.2 % (ref 11.5–15.5)
WBC: 7.9 10*3/uL (ref 4.0–10.5)

## 2014-02-19 LAB — URINE CULTURE
COLONY COUNT: NO GROWTH
Culture: NO GROWTH

## 2014-02-19 MED ORDER — HYDROXYZINE HCL 25 MG PO TABS: 25.0000 mg | ORAL_TABLET | Freq: Three times a day (TID) | ORAL | Status: DC | PRN

## 2014-02-19 MED ORDER — BOOST / RESOURCE BREEZE PO LIQD: 1.0000 | Freq: Two times a day (BID) | ORAL | Status: DC

## 2014-02-19 MED ORDER — POTASSIUM CHLORIDE CRYS ER 20 MEQ PO TBCR
40.0000 meq | EXTENDED_RELEASE_TABLET | Freq: Once | ORAL | Status: AC
  Administered 2014-02-19: 40 meq via ORAL
  Filled 2014-02-19: qty 2

## 2014-02-19 MED ORDER — LEVOFLOXACIN 750 MG PO TABS: 750.0000 mg | ORAL_TABLET | Freq: Every day | ORAL | Status: DC

## 2014-02-19 MED ORDER — OXYCODONE HCL 10 MG PO TABS: 5.0000 mg | ORAL_TABLET | Freq: Every day | ORAL | Status: DC

## 2014-02-19 MED ORDER — DIAZEPAM 5 MG PO TABS: 5.0000 mg | ORAL_TABLET | Freq: Two times a day (BID) | ORAL | Status: DC | PRN

## 2014-02-19 NOTE — Progress Notes (Signed)
Patient discharge teaching given, including activity, diet, follow-up appoints, and medications. Patient verbalized understanding of all discharge instructions. IV access was d/c'd. Vitals are stable. Skin is intact except as charted in most recent assessments. Pt to be escorted out by NT, to be driven home by family. 

## 2014-02-19 NOTE — Care Management Note (Signed)
    Page 1 of 1   02/19/2014     3:09:04 PM CARE MANAGEMENT NOTE 02/19/2014  Patient:  Brandon MelnickLEMONDS,Luceal   Account Number:  [id number]  Date Initiated:  02/19/2014  Documentation initiated by:  Letha CapeAYLOR,Adeeb Konecny  Subjective/Objective Assessment:   dx uti  admit- lives with spouse.     Action/Plan:   pt eval- rec hhpt.   Anticipated DC Date:  02/19/2014   Anticipated DC Plan:  HOME W HOME HEALTH SERVICES      DC Planning Services  CM consult      [facility]   Choice offered to / List presented to:  C-3 Spouse        HH arranged  HH-2 PT      [facility] L[facility] agency  [facility]   Status of service:  Completed, signed off Medicare Important Message given?  NA - LOS <3 / Initial given by admissions (If response is "NO", the following Medicare IM given date fields will be blank) Date Medicare IM given:  02/17/2014 Date Additional Medicare IM given:    Discharge Disposition:  HOME W HOME HEALTH SERVICES  Per UR Regulation:  Reviewed for med. necessity/level of care/duration of stay  If discussed at Long Length of Stay Meetings, dates discussed:    Comments:  02/19/14 1507 Letha Capeeborah Marshae Azam RN, BSN [phone] patient lives with spouse, per physical therapy rec hhpt, patient 's spouse [facility]  Soc will begin 24-48 hrs post dc.  Patient s spouse stated hhpt would be the only hh service they need.

## 2014-02-19 NOTE — Discharge Summary (Signed)
Physician Discharge Summary  Laura MelnickMelody [name] ZOX:[id number] DOB: [birth year] DOA: 02/17/2014  PCP: Laura PrimesAlex Plotnikov, MD  Admit date: 02/17/2014 Discharge date: 02/19/2014  Time spent: 45 minutes  Recommendations for Outpatient Follow-up:  1. PCP please check final Urine culture and Blood culture results. 2. sedating medications discontinued. 3. Follow up with PT for home therapy 4. Continue home O2  Discharge Diagnoses:  Active Problems:   HYPERTENSION   COPD GOLD II   Herpes zoster   Postherpetic neuralgia   UTI (lower urinary tract infection)   Encephalopathy acute   Acute encephalopathy   Fever, unspecified   Discharge Condition: stable  Diet recommendation: low sodium heart healthy  Filed Weights   02/17/14 0945 02/18/14 0548  Weight: 60.328 kg (133 lb) 58.877 kg (129 lb 12.8 oz)    History of present illness:   Laura Mable ParisLemonds is a 68 y.o. female with a Past Medical History of herpes zoster of the left facial area with postherpetic neuralgia on Neurontin, oxycodone, she of COPD on nocturnal home O2, hypertension who presented with altered mental status. Patient in April had left facial herpetic eruption, then developed painful herpetic neuralgia and was placed on Neurontin and narcotics. She was doing well until this past weekend, when on Sunday she was noted to have weakness and confusion. Along with Neurontin, patient was also narcotics and Valium, patient was also taking hydroxyzine as needed for itching. Suspecting that polypharmacy was playing a role, the patient's husband did not give her any further narcotics and Neurontin. However for the past few days her confusion and weakness worsened, patient required almost complete assistance from the patient's husband to get around the house. Husband also noted that along with the weakness, patient's appetite had significantly decreased. As a result EMS was called, per RN note-pupils were 3 mm and reactive, EMS gave 1 mg of Narcan,  following which patient became much more alert and awake.   Hospital Course:  Acute encephalopathy   She is on multiple sedating medications as well as narcotics and benzos- suspect cause of altered mental status  All sedating medications were stopped while inpatient.  Recommended no valium, OxyContin or gabapentin at d/c.  C-Xray unremarkable  CT- head, unremarkable  BMET- low potassium- replaced   Mental status is much improved ,Patient is clear, A&O x 4 and coherent at the time of discharge.  Husband appears very motivated to assist his wife in her recovery.  Fever  Developed a fever of 101 first night of inpatient stay- cause unknown  UA revealed possible UTI infection,   CBC- mild leukocytosis prob from chronic steroid use -resolved 02/16/2014  Blood cultures NGTD  Urine cultures still pending  Will discharge on levaquin for 4 more days as empiric therapy (resp and uti)  UTI  UA possible UTI,  clean catch?  Treated with Rocephin as an inpatient-urine cs pending, will discharge on Levaquin.  Follow with PCP after discharge  COPD  Patient on 2L at home at night, prednisone 5mg  daily.  Prior to admission.  Demonstrated an increased need for oxygen as she desaturated to 87% with ambulation on room air.  Recommend 2L 24x7 until re-evaluated by PCP in follow up.  Hypertension  Verapamil per patients home medications  Controlled  GERD  PPI  Etoh use  History of having a glass of scotch a night  Counseled extensively on Etoh cessation  Procedures:  none  Consultations:  Physical therapy  Discharge Exam: Filed Vitals:   02/19/14 0604  BP: 127/77  Pulse: 83  Temp: 98.4 F (36.9 C)  Resp: 16    Physical Exam  Constitutional: oriented to person, place, and time.appears well-developed and well-nourished. No distress.  HENT:   Head: Normocephalic and atraumatic.   Eyes: Pupils are equal, round, and reactive to light.  Neck: No JVD present.   Cardiovascular: Normal rate, regular rhythm and normal heart sounds.   Respiratory: Effort normal and breath sounds normal. No stridor. No respiratory distress. no wheezes. no tenderness.  GI: Soft. Bowel sounds are normal. no distension. There is no tenderness. There is no rebound.  Musculoskeletal: Normal range of motion. no tenderness.  Neurological: alert and oriented to person, place, and time.  Skin: Skin is warm and dry. Not diaphoretic. No erythema.  Psychiatric: has a normal mood and affect. speech is normal and behavior is normal. Judgment and thought content normal. Cognition and memory are normal.          Discharge Orders   Future Appointments Provider Department Dept Phone   02/26/2014 8:45 AM Tresa Garter, MD [facility] [phone]   Future Orders Complete By Expires   Diet - low sodium heart healthy  As directed    Increase activity slowly  As directed        Medication List    STOP taking these medications       diazepam 5 MG tablet  Commonly known as:  VALIUM     gabapentin 100 MG capsule  Commonly known as:  NEURONTIN     Oxycodone HCl 10 MG Tabs      TAKE these medications       calcium-vitamin D 500-200 MG-UNIT per tablet  Take 1 tablet by mouth daily.     cetirizine 10 MG tablet  Commonly known as:  ZYRTEC  Take 10 mg by mouth daily as needed for allergies. Take 1 at bedtime as needed     cholecalciferol 1000 UNITS tablet  Commonly known as:  VITAMIN D  Take 1,000 Units by mouth daily.     DULERA 200-5 MCG/ACT Aero  Generic drug:  mometasone-formoterol  Inhale 2 puffs into the lungs 2 (two) times daily.     feeding supplement (RESOURCE BREEZE) Liqd  Take 1 Container by mouth 2 (two) times daily after a meal.     hydrOXYzine 25 MG tablet  Commonly known as:  ATARAX/VISTARIL  Take 1 tablet (25 mg total) by mouth every 8 (eight) hours as needed for itching (Take only if needed.  Do not take if sleepy or  confused).     ibuprofen 200 MG tablet  Commonly known as:  ADVIL,MOTRIN  Take 600 mg by mouth every 6 (six) hours as needed for moderate pain.     levofloxacin 750 MG tablet  Commonly known as:  LEVAQUIN  Take 1 tablet (750 mg total) by mouth daily.     NON FORMULARY  Oxygen  1.5 L at bedtime     omeprazole 40 MG capsule  Commonly known as:  PRILOSEC  Take 40 mg by mouth daily.     predniSONE 5 MG tablet  Commonly known as:  DELTASONE  Take 5 mg by mouth daily with breakfast.     PROAIR HFA 108 (90 BASE) MCG/ACT inhaler  Generic drug:  albuterol  Inhale 2 puffs into the lungs every 6 (six) hours as needed for wheezing or shortness of breath.     tiotropium 18 MCG inhalation capsule  Commonly known as:  SPIRIVA HANDIHALER  Place 1 capsule (18 mcg total) into inhaler and inhale daily. (PLAN A)     verapamil 180 MG 24 hr capsule  Commonly known as:  VERELAN PM  Take 180 mg by mouth at bedtime.       Allergies  Allergen Reactions  . Tramadol     memory issues   Follow-up Information   Follow up with Laura Primes, MD In 1 week.   Specialty:  Internal Medicine   Contact information:   [address] [place] [address] [phone]        The results of significant diagnostics from this hospitalization (including imaging, microbiology, ancillary and laboratory) are listed below for reference.    Significant Diagnostic Studies: Dg Chest 2 View  02/17/2014   CLINICAL DATA:  Shortness of breath and weakness.  EXAM: CHEST  2 VIEW  COMPARISON:  PA and lateral chest 08/20/2013.  CT chest 03/06/2013.  FINDINGS: The lungs are emphysematous but clear. Heart size is normal. No pneumothorax or pleural effusion. Mild superior endplate compression fracture at the thoracolumbar junction is unchanged.  IMPRESSION: Emphysema without acute disease.   Electronically Signed   By: Drusilla Kanner M.D.   On: 02/17/2014 11:32   Ct Head Wo  Contrast  02/17/2014   CLINICAL DATA:  Weakness  EXAM: CT HEAD WITHOUT CONTRAST  TECHNIQUE: Contiguous axial images were obtained from the base of the skull through the vertex without intravenous contrast.  COMPARISON:  None.  FINDINGS: Generalized atrophy. Mild chronic microvascular ischemic change in the white matter.  Negative for acute infarct, hemorrhage, or mass lesion.  IMPRESSION: No acute abnormality.   Electronically Signed   By: Marlan Palau M.D.   On: 02/17/2014 13:23    Microbiology: Recent Results (from the past 240 hour(s))  CULTURE, BLOOD (ROUTINE X 2)     Status: None   Collection Time    02/18/14  9:22 AM      Result Value Ref Range Status   Specimen Description BLOOD LEFT HAND   Final   Special Requests BOTTLES DRAWN AEROBIC AND ANAEROBIC 10CC   Final   Culture  Setup Time     Final   Value: 02/18/2014 14:45     Performed at [facility]   Culture     Final   Value:        BLOOD CULTURE RECEIVED NO GROWTH TO DATE CULTURE WILL BE HELD FOR 5 DAYS BEFORE ISSUING A FINAL NEGATIVE REPORT     Performed at [facility]   Report Status PENDING   Incomplete  CULTURE, BLOOD (ROUTINE X 2)     Status: None   Collection Time    02/18/14  9:39 AM      Result Value Ref Range Status   Specimen Description BLOOD RIGHT ANTECUBITAL   Final   Special Requests BOTTLES DRAWN AEROBIC AND ANAEROBIC 10CC   Final   Culture  Setup Time     Final   Value: 02/18/2014 14:46     Performed at [facility]   Culture     Final   Value:        BLOOD CULTURE RECEIVED NO GROWTH TO DATE CULTURE WILL BE HELD FOR 5 DAYS BEFORE ISSUING A FINAL NEGATIVE REPORT     Performed at [facility]   Report Status PENDING   Incomplete     Labs: Basic Metabolic Panel:  Recent Labs Lab 02/17/14 1107 02/17/14 1650  02/19/14 0647  NA 139  --  143  K 3.5*  --  3.3*  CL 101  --  106  CO2 18*  --  19  GLUCOSE 101*  --  78  BUN 20  --  16  CREATININE 1.29* 1.22* 1.05  CALCIUM  9.8  --  9.1   Liver Function Tests:  Recent Labs Lab 02/17/14 1107  AST 20  ALT 14  ALKPHOS 82  BILITOT 1.0  PROT 7.5  ALBUMIN 3.6   CBC:  Recent Labs Lab 02/17/14 1107 02/17/14 1650 02/19/14 0647  WBC 13.1* 14.2* 7.9  HGB 15.7* 15.4* 12.5  HCT 44.0 44.1 37.1  MCV 91.1 91.9 92.8  PLT 300 286 246      Signed:  Noland FordyceGary Alan Benda, Student-PA Marianne L York, [place]PA-C [phone] [facility] 02/19/2014, 11:21 AM   Attending Patient seen and examined, agree with the assessment and plan. Patient requesting discharged today, she has ambulated in the hallway, remains afebrile overnight.Blood culture negative so far, mental status significantly improved-per husband back to baseline-since so much improved-will discharge on Levaquin. Would continue to hold Neurontin, Valium and Narcotics on discharge.  Windell NorfolkS Vonette Grosso MD

## 2014-02-19 NOTE — Discharge Instructions (Signed)
YOUR SYMPTOMS CAME FROM TAKING TOO MUCH MEDICATION.  THE COMBINATION OF ANY OF THESE MEDICATIONS: OXYCODONE, DIAZEPAM, GABAPENTIN, AND HYDROXYZINE MIXED TOGETHER - ESPECIALLY WHEN COMBINED WITH ANY AMOUNT OF ALCOHOL CAN CAUSE ALTERED MENTAL STATUS AND CONFUSION.  PLEASE DO NOT DRINK ANY ALCOHOL WITH THESE MEDICATIONS - AND TAKE THE "PRN" (AS NEEDED) ONLY WHEN NEEDED.  GABAPENTIN and OXYCODONE HAVE BEEN DISCONTINUED.  SEVERAL OF THE OTHER MEDICATIONS HAVE BEEN DECREASED OR THE FREQUENCY HAS BEEN REDUCED.  Your oxygen requirement has increased.  You currently require 2L 24x7 of continuous oxygen.  Please ask your PCP to re-evaluate your oxygen requirement in follow up.

## 2014-02-24 LAB — CULTURE, BLOOD (ROUTINE X 2)
CULTURE: NO GROWTH
Culture: NO GROWTH

## 2014-02-26 ENCOUNTER — Encounter: Payer: Self-pay | Admitting: Internal Medicine

## 2014-02-26 ENCOUNTER — Other Ambulatory Visit: Payer: Self-pay | Admitting: Internal Medicine

## 2014-02-26 ENCOUNTER — Ambulatory Visit (INDEPENDENT_AMBULATORY_CARE_PROVIDER_SITE_OTHER): Payer: Medicare Other | Admitting: Internal Medicine

## 2014-02-26 VITALS — BP 118/66 | HR 68 | Temp 97.6°F | Resp 16 | Wt 120.0 lb

## 2014-02-26 DIAGNOSIS — L501 Idiopathic urticaria: Secondary | ICD-10-CM

## 2014-02-26 DIAGNOSIS — G251 Drug-induced tremor: Secondary | ICD-10-CM

## 2014-02-26 DIAGNOSIS — G9340 Encephalopathy, unspecified: Secondary | ICD-10-CM

## 2014-02-26 DIAGNOSIS — F1020 Alcohol dependence, uncomplicated: Secondary | ICD-10-CM | POA: Insufficient documentation

## 2014-02-26 DIAGNOSIS — J449 Chronic obstructive pulmonary disease, unspecified: Secondary | ICD-10-CM

## 2014-02-26 DIAGNOSIS — G252 Other specified forms of tremor: Secondary | ICD-10-CM

## 2014-02-26 DIAGNOSIS — G250 Essential tremor: Secondary | ICD-10-CM

## 2014-02-26 DIAGNOSIS — I10 Essential (primary) hypertension: Secondary | ICD-10-CM

## 2014-02-26 DIAGNOSIS — B0233 Zoster keratitis: Secondary | ICD-10-CM

## 2014-02-26 DIAGNOSIS — F1027 Alcohol dependence with alcohol-induced persisting dementia: Secondary | ICD-10-CM | POA: Insufficient documentation

## 2014-02-26 MED ORDER — CYCLOBENZAPRINE HCL 5 MG PO TABS: 5.0000 mg | ORAL_TABLET | Freq: Two times a day (BID) | ORAL | Status: DC | PRN

## 2014-02-26 MED ORDER — NYSTATIN 100000 UNIT/ML MT SUSP: 500000.0000 [IU] | Freq: Four times a day (QID) | OROMUCOSAL | Status: DC

## 2014-02-26 MED ORDER — B COMPLEX PO TABS: 1.0000 | ORAL_TABLET | Freq: Every day | ORAL | Status: DC

## 2014-02-26 NOTE — Telephone Encounter (Signed)
Ok - use w/caution -- Flexeril 5 mg bid prn Thx

## 2014-02-26 NOTE — Assessment & Plan Note (Addendum)
5/15 due to meds and ETOH 1/2 gal whisky per week

## 2014-02-26 NOTE — Assessment & Plan Note (Signed)
Postherpetic neuralgia - severe

## 2014-02-26 NOTE — Assessment & Plan Note (Signed)
Better off Prednisone, ETOH, meds

## 2014-02-26 NOTE — Assessment & Plan Note (Signed)
Resolved

## 2014-02-26 NOTE — Assessment & Plan Note (Signed)
Started before 2015 per husband

## 2014-02-26 NOTE — Telephone Encounter (Signed)
Pt's husband  informed of below.  

## 2014-02-26 NOTE — Assessment & Plan Note (Signed)
Continue with current prescription therapy as reflected on the Med list.  

## 2014-02-26 NOTE — Progress Notes (Signed)
Subjective:     HPI  Post-hosp visit:   PCP: Sonda PrimesAlex Dimitrius Steedman, MD  Admit date: 02/17/2014  Discharge date: 02/19/2014  Time spent: 45 minutes  Recommendations for Outpatient Follow-up:  1. PCP please check final Urine culture and Blood culture results. 2. sedating medications discontinued. 3. Follow up with PT for home therapy 4. Continue home O2 Discharge Diagnoses:  Active Problems:  HYPERTENSION  COPD GOLD II  Herpes zoster  Postherpetic neuralgia  UTI (lower urinary tract infection)  Encephalopathy acute  Acute encephalopathy  Fever, unspecified  Discharge Condition: stable  Diet recommendation: low sodium heart healthy  Filed Weights    02/17/14 0945  02/18/14 0548   Weight:  60.328 kg (133 lb)  58.877 kg (129 lb 12.8 oz)   History of present illness:  [name] is a 68 y.o. female with a Past Medical History of herpes zoster of the left facial area with postherpetic neuralgia on Neurontin, oxycodone, she of COPD on nocturnal home O2, hypertension who presented with altered mental status. Patient in April had left facial herpetic eruption, then developed painful herpetic neuralgia and was placed on Neurontin and narcotics. She was doing well until this past weekend, when on Sunday she was noted to have weakness and confusion. Along with Neurontin, patient was also narcotics and Valium, patient was also taking hydroxyzine as needed for itching. Suspecting that polypharmacy was playing a role, the patient's husband did not give her any further narcotics and Neurontin. However for the past few days her confusion and weakness worsened, patient required almost complete assistance from the patient's husband to get around the house. Husband also noted that along with the weakness, patient's appetite had significantly decreased. As a result EMS was called, per RN note-pupils were 3 mm and reactive, EMS gave 1 mg of Narcan, following which patient became much more alert and awake.   Hospital Course:  Acute encephalopathy  She is on multiple sedating medications as well as narcotics and benzos- suspect cause of altered mental status  All sedating medications were stopped while inpatient. Recommended no valium, OxyContin or gabapentin at d/c.  C-Xray unremarkable  CT- head, unremarkable  BMET- low potassium- replaced  Mental status is much improved ,Patient is clear, A&O x 4 and coherent at the time of discharge.  Husband appears very motivated to assist his wife in her recovery. Fever  Developed a fever of 101 first night of inpatient stay- cause unknown  UA revealed possible UTI infection,  CBC- mild leukocytosis prob from chronic steroid use -resolved 02/16/2014  Blood cultures NGTD  Urine cultures still pending  Will discharge on levaquin for 4 more days as empiric therapy (resp and uti) UTI  UA possible UTI, clean catch?  Treated with Rocephin as an inpatient-urine cs pending, will discharge on Levaquin.  Follow with PCP after discharge COPD  Patient on 2L at home at night, prednisone 5mg  daily. Prior to admission.  Demonstrated an increased need for oxygen as she desaturated to 87% with ambulation on room air.  Recommend 2L 24x7 until re-evaluated by PCP in follow up. Hypertension  Verapamil per patients home medications  Controlled GERD  PPI Etoh use  History of having a glass of scotch a night  Counseled extensively on Etoh cessation Procedures:  none Consultations:  Physical therapy Discharge Exam:  Filed Vitals:    02/19/14 0604   BP:  127/77   Pulse:  83   Temp:  98.4 F (36.9 C)   Resp:  16  Physical Exam  Constitutional: oriented to person, place, and time.appears well-developed and well-nourished. No distress.  HENT:  Head: Normocephalic and atraumatic.  Eyes: Pupils are equal, round, and reactive to light.  Neck: No JVD present.  Cardiovascular: Normal rate, regular rhythm and normal heart sounds.  Respiratory: Effort normal and  breath sounds normal. No stridor. No respiratory distress. no wheezes. no tenderness.  GI: Soft. Bowel sounds are normal. no distension. There is no tenderness. There is no rebound.  Musculoskeletal: Normal range of motion. no tenderness.  Neurological: alert and oriented to person, place, and time.  Skin: Skin is warm and dry. Not diaphoretic. No erythema.  Psychiatric: has a normal mood and affect. speech is normal and behavior is normal. Judgment and thought content normal. Cognition and memory are normal.      F/u for H zoster L face/L eye -- 01/13/14. C/o pain 3/10 on 10 mg Hydrocodone - pain meds don't work  No blurred vision. She did see an eye dr  F/u rapid heart beat - Dr Sherene SiresWert stopped Losartan HCT. SBP 140-150 HR 90-100 The spasms in the back have resolved  The patient presents for a follow-up of  chronic hypertension elev BP at home  F/u on tremor - better, COPD, fatigue. C/o weakness, SOB. Working 4 d/wk - 20 hrs a week     BP Readings from Last 3 Encounters:  02/26/14 118/66  02/19/14 137/95  02/01/14 120/80   Wt Readings from Last 3 Encounters:  02/26/14 120 lb (54.432 kg)  02/18/14 129 lb 12.8 oz (58.877 kg)  02/01/14 129 lb (58.514 kg)                  Review of Systems  Constitutional: Positive for fatigue. Negative for activity change, appetite change and unexpected weight change.  HENT: Negative for congestion, mouth sores and sinus pressure.   Eyes: Negative for visual disturbance.  Respiratory: Negative for chest tightness.   Gastrointestinal: Negative for nausea and abdominal pain.  Genitourinary: Negative for frequency, difficulty urinating and vaginal pain.  Musculoskeletal: Negative for back pain and gait problem.  Skin: Negative for pallor.  Neurological: Positive for tremors. Negative for dizziness, weakness and numbness.  Psychiatric/Behavioral: Negative for confusion and disturbed wake/sleep cycle. The patient is nervous/anxious.         Objective:   Physical Exam  Constitutional: She appears well-developed. No distress.  HENT: L eye w/conj erythema Head: Normocephalic.  Right Ear: External ear normal.  Left Ear: External ear normal.  Nose: Nose normal.  Mouth/Throat: Oropharynx is clear and moist. L inner upper lip/cheek w/ulcers Eyes: Conjunctivae are normal. Pupils are equal, round, and reactive to light. Right eye exhibits no discharge. Left eye exhibits no discharge.  Neck: Normal range of motion. Neck supple. No JVD present. No tracheal deviation present. No thyromegaly present.  Cardiovascular: Normal rate, regular rhythm and normal heart sounds.   Pulmonary/Chest: Effort normal. No stridor. No respiratory distress. She has no wheezes. She has no rales. She exhibits no tenderness.  Abdominal: Soft. Bowel sounds are normal. She exhibits no distension and no mass. There is no tenderness. There is no rebound and no guarding.  Musculoskeletal: She exhibits no edema and no tenderness.  Lymphadenopathy:    She has no cervical adenopathy.  Neurological: She displays normal reflexes. No cranial nerve deficit. She exhibits normal muscle tone. Coordination normal.       Mild tremor  Skin: crusted H zoster rash on L face  Psychiatric: She has  a normal mood and affect. Her behavior is normal. Judgment and thought content normal.   Tremor is much better  Lab Results   Lab Results  Component Value Date   WBC 7.9 02/19/2014   HGB 12.5 02/19/2014   HCT 37.1 02/19/2014   PLT 246 02/19/2014   GLUCOSE 78 02/19/2014   CHOL 226* 05/16/2010   TRIG 286.0* 05/16/2010   HDL 73.10 05/16/2010   LDLDIRECT 108.0 05/16/2010   ALT 14 02/17/2014   AST 20 02/17/2014   NA 143 02/19/2014   K 3.3* 02/19/2014   CL 106 02/19/2014   CREATININE 1.05 02/19/2014   BUN 16 02/19/2014   CO2 19 02/19/2014   TSH 0.42 10/01/2011   HGBA1C 5.4 01/05/2013         Assessment & Plan:

## 2014-02-26 NOTE — Telephone Encounter (Signed)
Pt called stated that she was in the hospital and is having home PT. Pt is experiencing muscle spasm and the PT recommended Dr. Macario GoldsPlot send in mild muscle relaxer for pt. Please advise, pt request this to done today if that's possible.

## 2014-02-26 NOTE — Progress Notes (Deleted)
Pre visit review using our clinic review tool, if applicable. No additional management support is needed unless otherwise documented below in the visit note. 

## 2014-02-26 NOTE — Assessment & Plan Note (Addendum)
1/2 gal whisky per week - stopped 5/15 Declined AA Start B complex

## 2014-02-27 ENCOUNTER — Encounter: Payer: Self-pay | Admitting: Internal Medicine

## 2014-03-07 ENCOUNTER — Other Ambulatory Visit: Payer: Self-pay | Admitting: Internal Medicine

## 2014-03-31 ENCOUNTER — Other Ambulatory Visit: Payer: Self-pay | Admitting: Internal Medicine

## 2014-04-08 ENCOUNTER — Encounter: Payer: Self-pay | Admitting: Internal Medicine

## 2014-04-08 ENCOUNTER — Ambulatory Visit (INDEPENDENT_AMBULATORY_CARE_PROVIDER_SITE_OTHER): Payer: Medicare Other | Admitting: Internal Medicine

## 2014-04-08 VITALS — BP 120/86 | HR 76 | Temp 98.6°F | Resp 16 | Wt 128.0 lb

## 2014-04-08 DIAGNOSIS — F1020 Alcohol dependence, uncomplicated: Secondary | ICD-10-CM

## 2014-04-08 DIAGNOSIS — I10 Essential (primary) hypertension: Secondary | ICD-10-CM

## 2014-04-08 DIAGNOSIS — B0229 Other postherpetic nervous system involvement: Secondary | ICD-10-CM

## 2014-04-08 DIAGNOSIS — J449 Chronic obstructive pulmonary disease, unspecified: Secondary | ICD-10-CM

## 2014-04-08 DIAGNOSIS — F1027 Alcohol dependence with alcohol-induced persisting dementia: Secondary | ICD-10-CM

## 2014-04-08 NOTE — Progress Notes (Signed)
Subjective:     HPI  F/u alcoholic encephalopathy - resolved. No ETOH. Doing much better. Not working now.  F/u for H zoster L face/L eye -- 01/13/14.   No blurred vision. She did see an eye dr  F/u rapid heart beat - Dr Sherene SiresWert stopped Losartan HCT. SBP 140-150 HR 90-100 The spasms in the back have resolved  The patient presents for a follow-up of  chronic hypertension elev BP at home  F/u on tremor - better, COPD, fatigue. C/o weakness, SOB. Working 4 d/wk - 20 hrs a week     BP Readings from Last 3 Encounters:  04/08/14 120/86  02/26/14 118/66  02/19/14 137/95   Wt Readings from Last 3 Encounters:  04/08/14 128 lb (58.06 kg)  02/26/14 120 lb (54.432 kg)  02/18/14 129 lb 12.8 oz (58.877 kg)                Review of Systems  Constitutional: Positive for fatigue. Negative for activity change, appetite change and unexpected weight change.  HENT: Negative for congestion, mouth sores and sinus pressure.   Eyes: Negative for visual disturbance.  Respiratory: Negative for chest tightness.   Gastrointestinal: Negative for nausea and abdominal pain.  Genitourinary: Negative for frequency, difficulty urinating and vaginal pain.  Musculoskeletal: Negative for back pain and gait problem.  Skin: Negative for pallor.  Neurological: Positive for tremors. Negative for dizziness, weakness and numbness.  Psychiatric/Behavioral: Negative for confusion and disturbed wake/sleep cycle. The patient is nervous/anxious.        Objective:   Physical Exam  Constitutional: She appears well-developed. No distress.  HENT: L eye w/conj erythema Head: Normocephalic.  Right Ear: External ear normal.  Left Ear: External ear normal.  Nose: Nose normal.  Mouth/Throat: Oropharynx is clear and moist. L inner upper lip/cheek w/ulcers Eyes: Conjunctivae are normal. Pupils are equal, round, and reactive to light. Right eye exhibits no discharge. Left eye exhibits no discharge.  Neck: Normal  range of motion. Neck supple. No JVD present. No tracheal deviation present. No thyromegaly present.  Cardiovascular: Normal rate, regular rhythm and normal heart sounds.   Pulmonary/Chest: Effort normal. No stridor. No respiratory distress. She has no wheezes. She has no rales. She exhibits no tenderness.  Abdominal: Soft. Bowel sounds are normal. She exhibits no distension and no mass. There is no tenderness. There is no rebound and no guarding.  Musculoskeletal: She exhibits no edema and no tenderness.  Lymphadenopathy:    She has no cervical adenopathy.  Neurological: She displays normal reflexes. No cranial nerve deficit. She exhibits normal muscle tone. Coordination normal.       Mild tremor  Skin: crusted H zoster rash on L face  Psychiatric: She has a normal mood and affect. Her behavior is normal. Judgment and thought content normal.   Tremor is much better  Lab Results   Lab Results  Component Value Date   WBC 7.9 02/19/2014   HGB 12.5 02/19/2014   HCT 37.1 02/19/2014   PLT 246 02/19/2014   GLUCOSE 78 02/19/2014   CHOL 226* 05/16/2010   TRIG 286.0* 05/16/2010   HDL 73.10 05/16/2010   LDLDIRECT 108.0 05/16/2010   ALT 14 02/17/2014   AST 20 02/17/2014   NA 143 02/19/2014   K 3.3* 02/19/2014   CL 106 02/19/2014   CREATININE 1.05 02/19/2014   BUN 16 02/19/2014   CO2 19 02/19/2014   TSH 0.42 10/01/2011   HGBA1C 5.4 01/05/2013  Assessment & Plan:

## 2014-04-08 NOTE — Progress Notes (Signed)
Pre visit review using our clinic review tool, if applicable. No additional management support is needed unless otherwise documented below in the visit note. 

## 2014-04-10 NOTE — Assessment & Plan Note (Signed)
Resolved

## 2014-04-10 NOTE — Assessment & Plan Note (Signed)
Much better off ETOH

## 2014-04-10 NOTE — Assessment & Plan Note (Signed)
Not drinking 

## 2014-04-10 NOTE — Assessment & Plan Note (Signed)
Continue with current prescription therapy as reflected on the Med list.  

## 2014-04-19 ENCOUNTER — Encounter: Payer: Self-pay | Admitting: Internal Medicine

## 2014-04-19 ENCOUNTER — Other Ambulatory Visit: Payer: Self-pay | Admitting: Internal Medicine

## 2014-04-19 ENCOUNTER — Ambulatory Visit (INDEPENDENT_AMBULATORY_CARE_PROVIDER_SITE_OTHER): Payer: Medicare Other | Admitting: Internal Medicine

## 2014-04-19 VITALS — BP 112/70 | HR 95 | Temp 98.5°F | Ht 61.5 in | Wt 131.0 lb

## 2014-04-19 DIAGNOSIS — J9610 Chronic respiratory failure, unspecified whether with hypoxia or hypercapnia: Secondary | ICD-10-CM

## 2014-04-19 DIAGNOSIS — J449 Chronic obstructive pulmonary disease, unspecified: Secondary | ICD-10-CM

## 2014-04-19 DIAGNOSIS — R0902 Hypoxemia: Secondary | ICD-10-CM

## 2014-04-19 DIAGNOSIS — J9611 Chronic respiratory failure with hypoxia: Secondary | ICD-10-CM

## 2014-04-19 NOTE — Progress Notes (Signed)
Subjective:     Patient ID: [name], female   DOB: [birth year]    MRN: [id number]    Brief patient profile:  [id number] [id number] who quit smoking in December 2007, with copd with minimum asthmatic component  baseline = grocery store ok, tends to avoid mall or superstores and [facility] since July 2010     History of Present Illness  06/08/2013 f/u ov/Denaja Verhoeven on pred 5 mg one daily and never need neb, avg saba sev times per day Chief Complaint  Patient presents with  . Followup with cxr and PFT    Pt states having increased SOB and cough since her last visit- relates this to hot, humid weather.  Cough is non prod and worsens when she goes out in the heat.   can still do [employer] s 02 on a good day but worse x sev weeks despite maint rx including pred at 5 mg daily assoc with worsening dry cough- rare saba hfa use  rec Stop losartan as your blood pressure is low > we may need to consider changing to alternative medication due to high heart rate   12/31/2013 f/u ov/Erik Burkett re: recertify for 02/ still on 10 mg per day though floor dose should be 5 per calendar Chief Complaint  Patient presents with  . Follow-up    Recert for o2. Breathing is unchanged. No new co's today.    using saba  avg twice daily but usually p activity. Rec Only use your albuterol as a rescue medication  Floor dose for prednisone is 5 mg per day as per calendar Use 02 with exercise at 2lpm but not needed at rest / standing/ or walking less than 200 ft   04/19/2014 f/u ov/Sherwood Castilla re: still on pred 5 mg daily and following her med calendar Chief Complaint  Patient presents with  . Follow-up    Pt reports her breathing is doing well "the best it has been".  She is using rescue inhaler approx 2 x per day.   walking up to sev hundred yards s 02 s stopping     No obvious daytime variabilty or assoc cough or cp or chest tightness, subjective wheeze overt sinus or hb symptoms. No unusual exp hx or h/o  childhood pna/ asthma or premature birth to her knowledge.   Sleeping ok without nocturnal  or early am exacerbation  of respiratory  c/o's or need for noct saba. Also denies any obvious fluctuation of symptoms with weather or environmental changes or other aggravating or alleviating factors except as outlined above   Current Medications, Allergies, Past Medical History, Past Surgical History, Family History, and Social History were reviewed in [facility] electronic [facility] record.  ROS  The following are not active complaints unless bolded sore throat, dysphagia, dental problems, itching, sneezing,  nasal congestion or excess/ purulent secretions, ear ache,   fever, chills, sweats, unintended wt loss, pleuritic or exertional cp, hemoptysis,  orthopnea pnd or leg swelling, presyncope, palpitations, heartburn, abdominal pain, anorexia, nausea, vomiting, diarrhea  or change in bowel or urinary habits, change in stools or urine, dysuria,hematuria,  rash, arthralgias, visual complaints, headache, numbness weakness or ataxia or problems with walking or coordination,  change in mood/affect or memory.           Past Medical History:  COPD  - PFT's 12/07/08 FEV1 1.26(61%) ratio 42, 5% resp to B2  - PFT's 02/03/09 FEV1 .88 (38%) ratio 40  - PFT's 02/08/10 FEV1 1.30 (  64%) ratio 45, 19% resp to B2 and DLC046% corrects to 63%  - PFT's 04/30/2011        1.28(64%) ratio 46, no better b2  dlco  50%  - HFA technique 75% Mar 04, 2009 > 75% October 05, 2010> 90% November 09, 2010  - 02 dep at hs since April 2010, no desat walking October 05, 2010  - Prednsone maint April 25, 2009  - Add on Qvar 80 December 28, 2009 > d/c November 09, 2010 as not effective  - Dulera 200 started 11/09/10 >> much better December 21, 2010  Hypertension  Hives 07-2007  Allergic rhinitis  - Sinus ct 01/2009 wnl  Goiter 2009 > resected 12/29/08  Health Maintenance......................................Marland Kitchen.[place]  - Pneumovax  September 15, 2009  - 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, 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, 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 LUNGS: barrel chest, distant BS w/ no wheezing  CV: RRR no s3 or murmur or increase in P2, no edema  ABD: soft and nontender with nl excursion in the supine position. No bruits or organomegaly, bowel sounds nl  MS: warm without deformities, calf tenderness, cyanosis or clubbing      CXR  08/20/2013 :  No edema or consolidation. Stable nodular opacity left lower lobe. Underlying emphysema.   Assessment:

## 2014-04-19 NOTE — Assessment & Plan Note (Addendum)
-   PFT's 04/30/2011        1.28(64% ratio 46, no better b2  dlco  50%  - PFT's 06/08/2013  FEV1  1.33 (63%) with ratio 49 and no change p saba, DLC0 49 p am spiriva, dulera - HFA technique 75% Mar 04, 2009 > 75% October 05, 2010> 90% November 09, 2010  - 02 dep at hs since April 2010, no desat walking October 05, 2010  - Prednsone maint since  April 25, 2009  - Dulera 200 started 11/09/10 >> much better December 21, 2010  - hfa 90% 08/20/2013  -med calendar 09/17/2013   Adequate control on present rx, reviewed >  Should be able to taper pred to 2.5 mg daily, a physiologic dose, using the principle that the lowest dose that works is the best dose     Each maintenance medication was reviewed in detail including most importantly the difference between maintenance and as needed and under what circumstances the prns are to be used. This was done in the context of a medication calendar review which provided the patient with a user-friendly unambiguous mechanism for medication administration and reconciliation and provides an action plan for all active problems. It is critical that this be shown to every doctor  for modification during the office visit if necessary so the patient can use it as a working document.

## 2014-04-19 NOTE — Patient Instructions (Signed)
Only use your albuterol as a rescue medication to be used if you can't catch your breath by resting or doing a relaxed purse lip breathing pattern.  - The less you use it, the better it will work when you need it. - Ok to use up to 2 puffs  every 4 hours if you must but call for immediate appointment if use goes up over your usual need - Don't leave home without it !!  (think of it like the spare tire for your car)  Floor dose for prednisone is 5 mg one half  per day as per calendar  Call us with formulary substitutes for your inhalers if they are cheaper for you  Please schedule a follow up visit in 3 months but call sooner if needed

## 2014-04-19 NOTE — Assessment & Plan Note (Signed)
-   02 at hs only at 1.5.pm [facility]    - ONO ra 07/10/12 > 1 hour and 19 min with sats <88 so rec continue 02    - 08/20/2013  Walked RA x 3 laps @ 185 ft each stopped due to end of study, min sob, sats 88% at en    - 12/31/2013  Walked RA  2 laps @ 185 ft each stopped due to  Sat 88% at end, corrected on 1.5 lpm   rec = 1.5 lpm at hs and prn during the day unless walking more than 200 ft  Adequate control on present rx, reviewed > no change in rx needed

## 2014-04-28 ENCOUNTER — Other Ambulatory Visit: Payer: Self-pay | Admitting: Internal Medicine

## 2014-05-26 ENCOUNTER — Other Ambulatory Visit: Payer: Self-pay | Admitting: Internal Medicine

## 2014-05-30 ENCOUNTER — Encounter: Payer: Self-pay | Admitting: Internal Medicine

## 2014-06-15 ENCOUNTER — Telehealth: Payer: Self-pay | Admitting: Internal Medicine

## 2014-06-15 DIAGNOSIS — R911 Solitary pulmonary nodule: Secondary | ICD-10-CM

## 2014-06-15 NOTE — Telephone Encounter (Signed)
Fine with me, dx is solitary pulmonary nodule

## 2014-06-15 NOTE — Telephone Encounter (Signed)
Spoke with the pt  She she has appt with MW in 07/23/14 Wants to know if she will need a cxr  If so, she would like to have this done on 07/21/14 when she sees Plotnikov  Please advise  Last cxr done 02/17/14

## 2014-06-15 NOTE — Telephone Encounter (Signed)
Order placed and pt aware. Nothing further needed 

## 2014-07-18 ENCOUNTER — Other Ambulatory Visit: Payer: Self-pay | Admitting: Internal Medicine

## 2014-07-21 ENCOUNTER — Ambulatory Visit: Payer: Medicare Other | Admitting: Internal Medicine

## 2014-07-23 ENCOUNTER — Ambulatory Visit (INDEPENDENT_AMBULATORY_CARE_PROVIDER_SITE_OTHER)
Admission: RE | Admit: 2014-07-23 | Discharge: 2014-07-23 | Disposition: A | Discharge status: Home / Self Care | Payer: Medicare Other | Source: Ambulatory Visit | Attending: Internal Medicine | Admitting: Internal Medicine

## 2014-07-23 ENCOUNTER — Ambulatory Visit (INDEPENDENT_AMBULATORY_CARE_PROVIDER_SITE_OTHER): Payer: Medicare Other | Admitting: Internal Medicine

## 2014-07-23 ENCOUNTER — Encounter: Payer: Self-pay | Admitting: Internal Medicine

## 2014-07-23 VITALS — BP 126/80 | HR 99 | Temp 99.3°F | Ht 61.5 in | Wt 142.0 lb

## 2014-07-23 DIAGNOSIS — J449 Chronic obstructive pulmonary disease, unspecified: Secondary | ICD-10-CM

## 2014-07-23 DIAGNOSIS — R911 Solitary pulmonary nodule: Secondary | ICD-10-CM

## 2014-07-23 DIAGNOSIS — J9611 Chronic respiratory failure with hypoxia: Secondary | ICD-10-CM

## 2014-07-23 DIAGNOSIS — Z23 Encounter for immunization: Secondary | ICD-10-CM

## 2014-07-23 MED ORDER — CYCLOBENZAPRINE HCL 10 MG PO TABS: ORAL_TABLET | ORAL | Status: DC

## 2014-07-23 NOTE — Patient Instructions (Addendum)
Follow the med calendar as is   See if symbicort is on the formulary or other alternative in same class  Please remember to go to the xray  department downstairs for your tests - we will call you with the results when they are available.  Please schedule a follow up visit in 3 months but call sooner if needed to see Tammy for new med calendar

## 2014-07-23 NOTE — Progress Notes (Signed)
Subjective:     Patient ID: Laura MelnickMelody Paolini, female   DOB: 01-Dec-1945    MRN: [id number]    Brief patient profile:  [age] yowf who quit smoking in December 2007, with GOLD II copd copd dx 06/08/13  with minimum asthmatic component  baseline = grocery store ok, tends to avoid mall or superstores and [facility]/ [facility] dep since July 2010     History of Present Illness  06/08/2013 f/u ov/Wert on pred 5 mg one daily and never need neb, avg saba sev times per day Chief Complaint  Patient presents with  . Followup with cxr and PFT    Pt states having increased SOB and cough since her last visit- relates this to hot, humid weather.  Cough is non prod and worsens when she goes out in the heat.   can still do [employer] s 02 on a good day but worse x sev weeks despite maint rx including pred at 5 mg daily assoc with worsening dry cough- rare saba hfa use  rec Stop losartan as your blood pressure is low > we may need to consider changing to alternative medication due to high heart rate   12/31/2013 f/u ov/Wert re: recertify for 02/ still on 10 mg per day though floor dose should be 5 per calendar Chief Complaint  Patient presents with  . Follow-up    Recert for o2. Breathing is unchanged. No new co's today.    using saba  avg twice daily but usually p activity. Rec Only use your albuterol as a rescue medication  Floor dose for prednisone is 5 mg per day as per calendar Use 02 with exercise at 2lpm but not needed at rest / standing/ or walking less than 200 ft   04/19/2014 f/u ov/Wert re: still on pred 5 mg daily and following her med calendar Chief Complaint  Patient presents with  . Follow-up    Pt reports her breathing is doing well "the best it has been".  She is using rescue inhaler approx 2 x per day.   walking up to sev hundred yards s 02 s stopping    rec Only use your albuterol as a rescue medication  Floor dose for prednisone is 5 mg one half  per day as per calendar Call  us with formulary substitutes for your inhalers if they are cheaper for you> did not do    07/23/2014 f/u ov/Wert re: GOLD II/ steroid dep at 5mg  floor (never tried the 2.5 mg rec at last ov) dulera/spriva Chief Complaint  Patient presents with  . Follow-up    Pt states having increased SOB and cough for the past 3 months. Cough is prod with minimal clear sputum. She states that she is SOB walking from room to room. She is using proair 3-4 times per day.   not using 02 with exertion as rec  No obvious daytime variabilty or assoc  cp or chest tightness, subjective wheeze overt sinus or hb symptoms. No unusual exp hx or h/o childhood pna/ asthma or premature birth to her knowledge.   Sleeping ok without nocturnal  or early am exacerbation  of respiratory  c/o's or need for noct saba. Also denies any obvious fluctuation of symptoms with weather or environmental changes or other aggravating or alleviating factors except as outlined above   Current Medications, Allergies, Past Medical History, Past Surgical History, Family History, and Social History were reviewed in [facility] electronic medical record.  ROS  The following are not active complaints unless bolded sore throat, dysphagia, dental problems, itching, sneezing,  nasal congestion or excess/ purulent secretions, ear ache,   fever, chills, sweats, unintended wt loss, pleuritic or exertional cp, hemoptysis,  orthopnea pnd or leg swelling, presyncope, palpitations, heartburn, abdominal pain, anorexia, nausea, vomiting, diarrhea  or change in bowel or urinary habits, change in stools or urine, dysuria,hematuria,  rash, arthralgias, visual complaints, headache, numbness weakness or ataxia or problems with walking or coordination,  change in mood/affect or memory.           Past Medical History:  COPD  - PFT's 12/07/08 FEV1 1.26(61%) ratio 42, 5% resp to B2  - PFT's 02/03/09 FEV1 .88 (38%) ratio 40  - PFT's 02/08/10 FEV1 1.30 (64%) ratio  45, 19% resp to B2 and DLC046% corrects to 63%  - PFT's 04/30/2011        1.28(64%) ratio 46, no better b2  dlco  50%  - HFA technique 75% Mar 04, 2009 > 75% October 05, 2010> 90% November 09, 2010  - 02 dep at hs since April 2010, no desat walking October 05, 2010  - Prednsone maint April 25, 2009  - Add on Qvar 80 December 28, 2009 > d/c November 09, 2010 as not effective  - Dulera 200 started 11/09/10 >> much better December 21, 2010  Hypertension  Hives 07-2007  Allergic rhinitis  - Sinus ct 01/2009 wnl  Goiter 2009 > resected 12/29/08  Health Maintenance......................................Marland Kitchen.[facility] [place]  - Pneumovax September 15, 2009  - 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  edentulous with dentures in place/ Oropharynx no thrush or excess pnd or cobblestoning.  No JVD or cervical adenopathy. Mild accessory muscle hypertrophy. Trachea midline, nl thryroid. Chest was hyperinflated by percussion with diminished breath sounds and moderate increased exp time without wheeze. Hoover sign positive at mid inspiration. Regular rate and rhythm without murmur gallop or rub or increase P2 or edema.  Abd: no hsm, nl excursion. Ext warm without cyanosis or clubbing.       CXR  07/23/2014 : No active cardiopulmonary disease. Emphysematous disease.    Assessment:

## 2014-07-24 NOTE — Assessment & Plan Note (Signed)
03/03/2013 cxr 10 mm nodule within the left midlung >CT chest 03/06/2013 spiculated nodule in the left lower lobe laterally measures about 1 cm. Small spiculated nodule in right lower lobe medially measures 7.5 mm. . No adenopathy. . Low density nodules are noted thyroid gland.  - PET 06/12/2013 = neg > rec q 3mon cxr   Not viz on plain cxr 07/23/14  Discussed in detail all the  indications, usual  risks and alternatives  relative to the benefits with patient who agrees to proceed with conservative f/u given severity of copd/sob

## 2014-07-24 NOTE — Assessment & Plan Note (Signed)
-   PFT's 04/30/2011        1.28(64% ratio 46, no better b2  dlco  50%  - PFT's 06/08/2013  FEV1  1.33 (63%) with ratio 49 and no change p saba, DLC0 49 p am spiriva, dulera - HFA technique 75% Mar 04, 2009 > 75% October 05, 2010> 90% November 09, 2010  - 02 dep at hs since April 2010, no desat walking October 05, 2010  - Prednsone maint since  April 25, 2009  - Dulera 200 started 11/09/10 >> much better December 21, 2010  - hfa 90% 08/20/2013     Each maintenance medication was reviewed in detail including most importantly the difference between maintenance and as needed and under what circumstances the prns are to be used. This was done in the context of a medication calendar review which provided the patient with a user-friendly unambiguous mechanism for medication administration and reconciliation and provides an action plan for all active problems. It is critical that this be shown to every doctor  for modification during the office visit if necessary so the patient can use it as a working document.

## 2014-07-24 NOTE — Assessment & Plan Note (Signed)
-   02 at hs only at [facility].5.pm [facility]    - ONO ra 07/10/12 > 1 hour and 19 min with sats <88 so rec continue 02    - 08/20/2013  Walked RA x 3 laps @ 185 ft each stopped due to end of study, min sob, sats 88% at en    - 12/31/2013  Walked RA  2 laps @ 185 ft each stopped due to  Sat 88% at end, corrected on 1.5 lpm      - 07/23/2014  Walked RA at fast pace  2 laps @ 185 ft each stopped due to  Sob/ sats 88%   rec = 1.5 lpm at hs and prn during the day unless walking more than 200 ft

## 2014-07-25 ENCOUNTER — Other Ambulatory Visit: Payer: Self-pay | Admitting: Internal Medicine

## 2014-08-16 ENCOUNTER — Other Ambulatory Visit: Payer: Self-pay | Admitting: Internal Medicine

## 2014-08-20 ENCOUNTER — Encounter: Payer: Self-pay | Admitting: Internal Medicine

## 2014-08-20 ENCOUNTER — Ambulatory Visit (INDEPENDENT_AMBULATORY_CARE_PROVIDER_SITE_OTHER): Payer: Medicare Other | Admitting: Internal Medicine

## 2014-08-20 VITALS — BP 132/82 | HR 111 | Ht 61.5 in | Wt 140.0 lb

## 2014-08-20 DIAGNOSIS — J9611 Chronic respiratory failure with hypoxia: Secondary | ICD-10-CM

## 2014-08-20 DIAGNOSIS — J449 Chronic obstructive pulmonary disease, unspecified: Secondary | ICD-10-CM

## 2014-08-20 MED ORDER — AMOXICILLIN-POT CLAVULANATE 875-125 MG PO TABS: 1.0000 | ORAL_TABLET | Freq: Two times a day (BID) | ORAL | Status: DC

## 2014-08-20 MED ORDER — HYDROCODONE-ACETAMINOPHEN 5-325 MG PO TABS: 1.0000 | ORAL_TABLET | ORAL | Status: DC | PRN

## 2014-08-20 MED ORDER — FLUTTER DEVI: Status: AC

## 2014-08-20 NOTE — Progress Notes (Signed)
Subjective:     Patient ID: Laura MelnickMelody [name], female   DOB: 05/26/1946    MRN: [id number]    Brief patient profile:  [age] yowf who quit smoking in December 2007, with GOLD II copd copd dx 06/08/13  with minimum asthmatic component  baseline = grocery store ok, tends to avoid mall or superstores and [facility] since July [facility]     History of Present Illness  06/08/2013 f/u ov/[name] on pred 5 mg one daily and never need neb, avg saba sev times per day Chief Complaint  Patient presents with  . Followup with cxr and PFT    Pt states having increased SOB and cough since her last visit- relates this to hot, humid weather.  Cough is non prod and worsens when she goes out in the heat.   can still do [employer] s 02 on a good day but worse x sev weeks despite maint rx including pred at 5 mg daily assoc with worsening dry cough- rare saba hfa use  rec Stop losartan as your blood pressure is low > we may need to consider changing to alternative medication due to high heart rate     07/23/2014 f/u ov/[name] re: GOLD II/ steroid dep at 5mg  floor (never tried the 2.5 mg rec at last ov) dulera/spriva Chief Complaint  Patient presents with  . Follow-up    Pt states having increased SOB and cough for the past 3 months. Cough is prod with minimal clear sputum. She states that she is SOB walking from room to room. She is using proair 3-4 times per day.   not using 02 with exertion as rec rec Use 02 with exertion Follow med calendar       08/20/2014 f/u ov/[name] re: GOLD II but steroid / 02 dep  Chief Complaint  Patient presents with  . Acute Visit    cough is worsenening, SOB, DOE, some brownish tint phlegm, tightness comes and goes in chest, rattling sound with breathing   not following action plan per med calendar except related to pred rx at 20 mg daily with floor of 5 mg daily, gradually worse sob and cough since last ov    No obvious daytime variabilty or assoc  cp or chest  tightness, subjective wheeze overt sinus or hb symptoms. No unusual exp hx or h/o childhood pna/ asthma or premature birth to her knowledge.   Sleeping ok without nocturnal  or early am exacerbation  of respiratory  c/o's or need for noct saba. Also denies any obvious fluctuation of symptoms with weather or environmental changes or other aggravating or alleviating factors except as outlined above   Current Medications, Allergies, Past Medical History, Past Surgical History, Family History, and Social History were reviewed in [facility] electronic medical record.  ROS  The following are not active complaints unless bolded sore throat, dysphagia, dental problems, itching, sneezing,  nasal congestion or excess/ purulent secretions, ear ache,   fever, chills, sweats, unintended wt loss, pleuritic or exertional cp, hemoptysis,  orthopnea pnd or leg swelling, presyncope, palpitations, heartburn, abdominal pain, anorexia, nausea, vomiting, diarrhea  or change in bowel or urinary habits, change in stools or urine, dysuria,hematuria,  rash, arthralgias, visual complaints, headache, numbness weakness or ataxia or problems with walking or coordination,  change in mood/affect or memory.           Past Medical History:  COPD  - PFT's 12/07/08 FEV1 1.26(61%) ratio 42, 5% resp to B2  -  PFT's 02/03/09 FEV1 .88 (38%) ratio 40  - PFT's 02/08/10 FEV1 1.30 (64%) ratio 45, 19% resp to B2 and DLC046% corrects to 63%  - PFT's 04/30/2011        1.28(64%) ratio 46, no better b2  dlco  50%  - HFA technique 75% Mar 04, 2009 > 75% October 05, 2010> 90% November 09, 2010  - 02 dep at hs since April 2010, no desat walking October 05, 2010  - Prednsone maint April 25, 2009  - Add on Qvar 80 December 28, 2009 > d/c November 09, 2010 as not effective  - Dulera 200 started 11/09/10 >> much better December 21, 2010  Hypertension  Hives 07-2007  Allergic rhinitis  - Sinus ct 01/2009 wnl  Goiter 2009 > resected 12/29/08  Health  Maintenance......................................[facility]land Kitchen.[facility] [place]  - Pneumovax September 15, 2009  - 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, L Fem -1.9, 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  edentulous with dentures in place/ Oropharynx no thrush or excess pnd or cobblestoning.  No JVD or cervical adenopathy. Mild accessory muscle hypertrophy. Trachea midline, nl thryroid. Chest was hyperinflated by percussion with diminished breath sounds and moderate increased exp time with mid exp bilateral wheeze. Hoover sign positive at mid inspiration. Regular rate and rhythm without murmur gallop or rub or increase P2 or edema.  Abd: no hsm, nl excursion. Ext warm without cyanosis or clubbing.       CXR  07/23/2014 : No active cardiopulmonary disease. Emphysematous disease.    Assessment:

## 2014-08-20 NOTE — Patient Instructions (Addendum)
Augmentin 875 mg take one pill twice daily  X 10 days - take at breakfast and supper with large glass of water.  It would help reduce the usual side effects (diarrhea and yeast infections) if you ate cultured yogurt at lunch.   If cough as per calendar take mucinex dx 1200 mg every 12 hours and pepcid 20 mg at bedtime and use flutter valve as much as possible  For severe cough > vicodin one every 4  hours   See Tammy NP w/in 2 weeks with all your medications, even over the counter meds, separated in two separate bags, the ones you take no matter what vs the ones you stop once you feel better and take only as needed when you feel you need them.   Tammy  will generate for you a new user friendly medication calendar that will put us all on the same page re: your medication use.     Without this process, it simply isn't possible to assure that we are providing  your outpatient care  with  the attention to detail we feel you deserve.   If we cannot assure that you're getting that kind of care,  then we cannot manage your problem effectively from this clinic.  Once you have seen Tammy and we are sure that we're all on the same page with your medication use she will arrange follow up with me.  Late add check hc03 next ov and do abgs if not nl

## 2014-08-21 NOTE — Assessment & Plan Note (Signed)
-   02 at hs only at [facility].5.pm [facility]    - ONO ra 07/10/12 > 1 hour and 19 min with sats <88 so rec continue 02    - 08/20/2013  Walked RA x 3 laps @ 185 ft each stopped due to end of study, min sob, sats 88% at en    - 12/31/2013  Walked RA  2 laps @ 185 ft each stopped due to  Sat 88% at end, corrected on 1.5 lpm      - 07/23/2014  Walked RA at fast pace  2 laps @ 185 ft each stopped due to  Sob/ sats 88%   rec = 1.5 lpm at hs and prn during the day unless walking more than 200 ft  Adequate control on present rx, reviewed > no change in rx needed

## 2014-08-21 NOTE — Assessment & Plan Note (Addendum)
-   PFT's 04/30/2011        1.28(64% ratio 46, no better b2  dlco  50%  - PFT's 06/08/2013  FEV1  1.33 (63%) with ratio 49 and no change p saba, DLC0 49 p am spiriva, dulera - HFA technique 75% Mar 04, 2009 > 75% October 05, 2010> 90% November 09, 2010  - 02 dep at hs since April 2010, no desat walking October 05, 2010  - Prednsone maint since  April 25, 2009  - Dulera 200 started 11/09/10 >> much better December 21, 2010  - hfa 90% 08/20/2013  -med calendar 09/17/2013   Poor control x one month. DDX of  difficult airways management all start with A and  include Adherence, Ace Inhibitors, Acid Reflux, Active Sinus Disease, Alpha 1 Antitripsin deficiency, Anxiety masquerading as Airways dz,  ABPA,  allergy(esp in young), Aspiration (esp in elderly), Adverse effects of DPI,  Active smokers, plus two Bs  = Bronchiectasis and Beta blocker use..and one C= CHF  Adherence is always the initial "prime suspect" and is a multilayered concern that requires a "trust but verify" approach in every patient - starting with knowing how to use medications, especially inhalers, correctly, keeping up with refills and understanding the fundamental difference between maintenance and prns vs those medications only taken for a very short course and then stopped and not refilled.  - despite having med calendar for over a year failed to show me today she understands how to use the prns listed as action plans for the symptoms she presently has  ? Active sinus dz > Augmentin 875 mg take one pill twice daily  X 10 days   ? Acid (or non-acid) GERD > always difficult to exclude as up to 75% of pts in some series report no assoc GI/ Heartburn symptoms> rec continue max (24h)  acid suppression and diet restrictions/ reviewed     ? Anxiety > usually dx of exclusion. Note her hc03 levels are low ? Hyperventilating or does she have an underlying med acidosis?  - recheck next ov and do abgs if HC03 not nl

## 2014-08-26 ENCOUNTER — Ambulatory Visit (INDEPENDENT_AMBULATORY_CARE_PROVIDER_SITE_OTHER)
Admission: RE | Admit: 2014-08-26 | Discharge: 2014-08-26 | Disposition: A | Discharge status: Home / Self Care | Payer: Medicare Other | Source: Ambulatory Visit | Attending: Internal Medicine | Admitting: Internal Medicine

## 2014-08-26 ENCOUNTER — Ambulatory Visit
Admission: RE | Admit: 2014-08-26 | Discharge: 2014-08-26 | Disposition: A | Discharge status: Home / Self Care | Payer: Medicare Other | Source: Ambulatory Visit | Attending: Internal Medicine | Admitting: Internal Medicine

## 2014-08-26 ENCOUNTER — Other Ambulatory Visit: Payer: Self-pay | Admitting: Internal Medicine

## 2014-08-26 ENCOUNTER — Encounter: Payer: Self-pay | Admitting: Internal Medicine

## 2014-08-26 ENCOUNTER — Ambulatory Visit (INDEPENDENT_AMBULATORY_CARE_PROVIDER_SITE_OTHER): Payer: Medicare Other | Admitting: Internal Medicine

## 2014-08-26 VITALS — BP 148/90 | HR 85 | Temp 98.4°F | Resp 14 | Wt 138.5 lb

## 2014-08-26 DIAGNOSIS — J449 Chronic obstructive pulmonary disease, unspecified: Secondary | ICD-10-CM

## 2014-08-26 DIAGNOSIS — Z8739 Personal history of other diseases of the musculoskeletal system and connective tissue: Secondary | ICD-10-CM

## 2014-08-26 DIAGNOSIS — Z87898 Personal history of other specified conditions: Secondary | ICD-10-CM

## 2014-08-26 DIAGNOSIS — M546 Pain in thoracic spine: Secondary | ICD-10-CM

## 2014-08-26 DIAGNOSIS — Z9189 Other specified personal risk factors, not elsewhere classified: Secondary | ICD-10-CM

## 2014-08-26 DIAGNOSIS — R52 Pain, unspecified: Secondary | ICD-10-CM

## 2014-08-26 NOTE — Progress Notes (Signed)
Pre visit review using our clinic review tool, if applicable. No additional management support is needed unless otherwise documented below in the visit note. 

## 2014-08-26 NOTE — Progress Notes (Signed)
   Subjective:    Patient ID: Laura MelnickMelody Magistro, female    DOB: 1946-09-29, 68 y.o.   MRN: [id number]  HPI   She has had lower thoracic back pain since September of this year. She was leaning over the kitchen counter opening a window when she felt and heard a pop in the right back. She had immediate pain in the lower thoracic spine area.  Since that time she's had intermittent sharp pain which lasts a few minutes but morphs into a  dull pain which can last all day. Intermittently there can be some anterior radiation of the pain bilaterally, more so on the right than the left  She's had only partial relief to heat, ice, hydrocodone, Flexeril, and Aspercreme.  The pain is increased when supine or in the right lateral decubitus position more so than the left.  She is a multi-decade smoker, having quit in 2005. 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 She's been on steroids orally for at least 8 years  She states she's had a DEXA study in the past but there is none on record.  Her husband states that she has had dramatic mental status changes on a combination of high-dose narcotics with gabapentin for postherpetic neuralgia.    Review of Systems  She has some diffuse myalgias.  There is no limb numbness, tingling, weakness  There is no loss of control of bladder or bowel function.  She's had no change in color or temperature of the skin in the area of the pain. This been no associated rash  She has no symptoms of fever, chills, sweats, unexplained weight loss.    Objective:   Physical Exam  Positive or pertinent findings include: She has classic cushingoid facies and truncal lipoid distribution changes Complete dentures are present. Tongue is beefy red. There is exaggeration of curvature of the upper thoracic spine There was no pain to percussion over the lower thoracic spine Heart sounds are distant; S4 is noted Breath sounds are markedly decreased Abdomen is  protuberant. She has slight clubbing of the nailbeds Posterior tibial pulses are decreased. Deep tendon reflexes, strength, and tone were normal. She has negative bilateral straight leg raising to almost 90.  General appearance :adequately nourished; in no distress. Eyes: No conjunctival inflammation or scleral icterus is present. Oral exam:  Lips and gums are healthy appearing.There is no oropharyngeal erythema or exudate noted.  Heart:  Normal rate and regular rhythm. S1 and S2 normal without gallop, murmur, click,or rub.   Lungs: no wheezes, rhonchi,rales ,or rubs present.No increased work of breathing.  Abdomen: bowel sounds normal, soft and non-tender without masses, organomegaly or hernias noted.  No guarding or rebound. No flank tenderness to percussion. Vascular : all pulses equal ; no bruits present. Skin:Warm & dry.  Intact without suspicious lesions or rashes ; no jaundice or tenting Lymphatic: No lymphadenopathy is noted about the head, neck, axilla           Assessment & Plan:  #1 lower thoracic pain; rule out vertebral compression fracture  #2 chronic steroid use  #3 possible gabapentin induced mental status changes. This was in the context of high dose narcotic medication as well.  Plan: Thoracic spine films will be performed to assess fracture  Additionally bone density will be repeated to assess generalized bone integrity. Her greatest health risk would be femoral neck fracture. She has risk factors of chronic steroid use; post menopausal state; and former multi decade smoking.

## 2014-08-26 NOTE — Patient Instructions (Addendum)
Your next office appointment will be determined based upon review of your pending  BMD & x-rays. Those instructions will be transmitted to you through My Chart  OR  by mail;whichever process is your choice to receive results & recommendations  Followup as needed for your acute issue. Please report any significant change in your symptoms.

## 2014-09-03 ENCOUNTER — Ambulatory Visit (INDEPENDENT_AMBULATORY_CARE_PROVIDER_SITE_OTHER): Payer: Medicare Other | Admitting: Adult Health

## 2014-09-03 ENCOUNTER — Telehealth: Payer: Self-pay | Admitting: *Deleted

## 2014-09-03 ENCOUNTER — Other Ambulatory Visit (INDEPENDENT_AMBULATORY_CARE_PROVIDER_SITE_OTHER): Payer: Medicare Other

## 2014-09-03 ENCOUNTER — Encounter: Payer: Self-pay | Admitting: Adult Health

## 2014-09-03 VITALS — BP 132/84 | HR 96 | Temp 97.0°F | Ht 61.5 in | Wt 140.0 lb

## 2014-09-03 DIAGNOSIS — I10 Essential (primary) hypertension: Secondary | ICD-10-CM

## 2014-09-03 DIAGNOSIS — J449 Chronic obstructive pulmonary disease, unspecified: Secondary | ICD-10-CM

## 2014-09-03 LAB — BASIC METABOLIC PANEL
BUN: 17 mg/dL (ref 6–23)
CHLORIDE: 102 meq/L (ref 96–112)
CO2: 24 mEq/L (ref 19–32)
Calcium: 9.9 mg/dL (ref 8.4–10.5)
Creatinine, Ser: 1.2 mg/dL (ref 0.4–1.2)
GFR: 47.61 mL/min — AB (ref >60.00)
Glucose, Bld: 130 mg/dL — ABNORMAL HIGH (ref 70–99)
POTASSIUM: 4.3 meq/L (ref 3.5–5.1)
SODIUM: 136 meq/L (ref 135–145)

## 2014-09-03 MED ORDER — HYDROCODONE-ACETAMINOPHEN 5-325 MG PO TABS: 1.0000 | ORAL_TABLET | ORAL | Status: DC | PRN

## 2014-09-03 NOTE — Telephone Encounter (Signed)
[name] in Pulmonary left vm stating pt was in to see [name] today. Pt was recently informed of below: Result Notes     Notes Recorded by Merrilyn PumaStacey N Zachary Nole, CMA on 09/01/2014 at 1:28 PM Pt informed ------  Notes Recorded by Pecola LawlessWilliam F Hopper, MD on 08/27/2014 at 6:25 AM Fractures @ T9 & T10 appear new; old T11 fracture present. As narcotics not controlling pain;vertebroplasty , the interventional radiologic process we discussed, should be considered. I shall contact the Radiologist who does this to see if he feels this is option after he reviews films & office note   Laura BumpsJessica is requesting to know what pain treatment plan will be in place. Pt c/o severe pain. Please advise.

## 2014-09-03 NOTE — Patient Instructions (Signed)
Finish Augmentin as discussed.  Taper Prednisone per Med Calendar.  Use pain meds cautiously as they cause you to be sleepy.  Follow med calendar closely and bring to each visit.  We will get in touch with Dr. Posey ReaPlotnikov regarding your back.  Follow up with Dr. Sherene SiresWert  In 3 -4 months and As needed   Please contact office for sooner follow up if symptoms do not improve or worsen or seek emergency care

## 2014-09-03 NOTE — Telephone Encounter (Signed)
[name] was given Norco today Thx

## 2014-09-06 ENCOUNTER — Telehealth: Payer: Self-pay | Admitting: *Deleted

## 2014-09-06 DIAGNOSIS — IMO0002 Reserved for concepts with insufficient information to code with codable children: Secondary | ICD-10-CM

## 2014-09-06 NOTE — Progress Notes (Signed)
Subjective:     Patient ID: Laura MelnickMelody [name], female   DOB: 1945/12/27    MRN: [id number]    Brief patient profile:  [id number] yowf who quit smoking in December 2007, with GOLD II copd copd dx 06/08/13  with minimum asthmatic component  baseline = grocery store ok, tends to avoid mall or superstores and [facility]/ steroid dep since July 2010     History of Present Illness  06/08/2013 f/u ov/Wert on pred 5 mg one daily and never need neb, avg saba sev times per day Chief Complaint  Patient presents with  . Followup with cxr and PFT    Pt states having increased SOB and cough since her last visit- relates this to hot, humid weather.  Cough is non prod and worsens when she goes out in the heat.   can still do [employer] s 02 on a good day but worse x sev weeks despite maint rx including pred at 5 mg daily assoc with worsening dry cough- rare saba hfa use  rec Stop losartan as your blood pressure is low > we may need to consider changing to alternative medication due to high heart rate     07/23/2014 f/u ov/Wert re: GOLD II/ steroid dep at 5mg  floor (never tried the 2.5 mg rec at last ov) dulera/spriva Chief Complaint  Patient presents with  . Follow-up    Pt states having increased SOB and cough for the past 3 months. Cough is prod with minimal clear sputum. She states that she is SOB walking from room to room. She is using proair 3-4 times per day.   not using 02 with exertion as rec rec Use 02 with exertion Follow med calendar       08/20/2014 f/u ov/Wert re: GOLD II but steroid / 02 dep  Chief Complaint  Patient presents with  . Acute Visit    cough is worsenening, SOB, DOE, some brownish tint phlegm, tightness comes and goes in chest, rattling sound with breathing   not following action plan per med calendar except related to pred rx at 20 mg daily with floor of 5 mg daily, gradually worse sob and cough since last ov  >>augmentin rx   09/03/14 Follow up  Returns for  follow up and med review  Reports some increased SOB pt attributes to new compression fractures Has seen PCP with back pain , dx w/ compression fx.  Seen last ov with AECOPD , tx w/ augmentin and pred burst , feeling some better.  We reviewed all her medications and asked them into a medication calendar .  She denies any chest pain, orthopnea, PND or leg swelling   Current Medications, Allergies, Past Medical History, Past Surgical History, Family History, and Social History were reviewed in [facility] electronic medical record.  ROS  The following are not active complaints unless bolded sore throat, dysphagia, dental problems, itching, sneezing,  nasal congestion or excess/ purulent secretions, ear ache,   fever, chills, sweats, unintended wt loss, pleuritic or exertional cp, hemoptysis,  orthopnea pnd or leg swelling, presyncope, palpitations, heartburn, abdominal pain, anorexia, nausea, vomiting, diarrhea  or change in bowel or urinary habits, change in stools or urine, dysuria,hematuria,  rash, arthralgias, visual complaints, headache, numbness weakness or ataxia or problems with walking or coordination,  change in mood/affect or memory.           Past Medical History:  COPD  - PFT's 12/07/08 FEV1 1.26(61%) ratio 42, 5% resp to  B2  - PFT's 02/03/09 FEV1 .88 (38%) ratio 40  - PFT's 02/08/10 FEV1 1.30 (64%) ratio 45, 19% resp to B2 and DLC046% corrects to 63%  - PFT's 04/30/2011        1.28(64%) ratio 46, no better b2  dlco  50%  - HFA technique 75% Mar 04, 2009 > 75% October 05, 2010> 90% November 09, 2010  - 02 dep at hs since April 2010, no desat walking October 05, 2010  - Prednsone maint April 25, 2009  - Add on Qvar 80 December 28, 2009 > d/c November 09, 2010 as not effective  - Dulera 200 started 11/09/10 >> much better December 21, 2010  Hypertension  Hives 07-2007  Allergic rhinitis  - Sinus ct 01/2009 wnl  Goiter 2009 > resected 12/29/08  Health  Maintenance......................................Marland Kitchen.[facility] [place]  - Pneumovax September 15, 2009  - 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 11/201/5           Objective:   Physical Exam Ambulatory mod anxious  wf nad min  cushingnoid   wt 144 April 25, 2009 > 11/19/2011 124 > 03/07/2012  131>  07/07/2012  136 > 07/25/2012   134> 08/06/2012 134 08/06/2012 > 138 12/02/2012 > 03/10/2013  138 > 132 06/08/2013 > 08/20/2013 136 > 12/31/2013 136 > 04/19/2014  131 > 07/23/2014 142> 08/20/2014 140 >09/03/14 140    HEENT mild turbinate edema.  edentulous with dentures in place/ Oropharynx no thrush or excess pnd or cobblestoning.  No JVD or cervical adenopathy. Mild accessory muscle hypertrophy. Trachea midline, nl thryroid. Chest was hyperinflated by percussion with diminished breath sounds and moderate increased exp time with mid exp bilateral wheeze. Hoover sign positive at mid inspiration. Regular rate and rhythm without murmur gallop or rub or increase P2 or edema.  Abd: no hsm, nl excursion. Ext warm without cyanosis or clubbing.       CXR  07/23/2014 : No active cardiopulmonary disease. Emphysematous disease.    Assessment:

## 2014-09-06 NOTE — Telephone Encounter (Signed)
Pt was seen by TP on 11.20.15 for med calendar - pt informed us that she has severe back pain and was diagnosed with compression fractures; nothing given for pain.  Called [facility] w/ Dr Posey ReaPlotnikov to see what their plan is for pt and if she could be be given rx for pain.  No answer on Stacy's voicemail; no call back.  Norco given by TP in the office.  Called [facility] again today to check on Dr Plotnikov's plan for pt - would he be okay with IR referral for the new compression fx's? (did not leave this question on VM).  No answer and no call back.  Per TP: okay to place referral for IR.  Called spoke with patient to discuss, she is okay with referral and is aware she will be contacted regarding appt.  Referral placed.  Will sign off.

## 2014-09-06 NOTE — Assessment & Plan Note (Signed)
Recent flare now resolving  Patient's medications were reviewed today and patient education was given. Computerized medication calendar was adjusted/completed  New compression fx , ? Refer to IR for kyphoplasty -will discuss with with PCP    Plan  Finish Augmentin as discussed.  Taper Prednisone per Med Calendar.  Use pain meds cautiously as they cause you to be sleepy.  Follow med calendar closely and bring to each visit.  We will get in touch with Dr. Posey ReaPlotnikov regarding your back.  Follow up with Dr. Sherene SiresWert  In 3 -4 months and As needed   Please contact office for sooner follow up if symptoms do not improve or worsen or seek emergency care

## 2014-09-08 ENCOUNTER — Other Ambulatory Visit: Payer: Self-pay | Admitting: Internal Medicine

## 2014-09-11 ENCOUNTER — Other Ambulatory Visit: Payer: Self-pay | Admitting: Internal Medicine

## 2014-09-13 ENCOUNTER — Telehealth: Payer: Self-pay | Admitting: Adult Health

## 2014-09-13 DIAGNOSIS — IMO0002 Reserved for concepts with insufficient information to code with codable children: Secondary | ICD-10-CM

## 2014-09-14 NOTE — Telephone Encounter (Signed)
Spoke with pt and advised of lab results.  Pt is also asking if we have found out anything about her back issues.  Per ov instructions:  Finish Augmentin as discussed.   Taper Prednisone per Med Calendar.   Use pain meds cautiously as they cause you to be sleepy.   Follow med calendar closely and bring to each visit.   We will get in touch with Dr. Posey ReaPlotnikov regarding your back.   Follow up with Dr. Sherene SiresWert  In 3 -4 months and As needed    Please contact office for sooner follow up if symptoms do not improve or worsen or seek emergency care     Please advise if you have spoken with PCP and if any recommendations

## 2014-09-16 NOTE — Addendum Note (Signed)
Addended by: Boone MasterJONES, [name] E on: 09/16/2014 04:43 PM   Modules accepted: Orders

## 2014-09-16 NOTE — Telephone Encounter (Signed)
Spoke with Victorino DikeJennifer at [facility] [phone] - order was placed correctly the first time but epic somehow signed and removed the order Order placed again Pt will need MR Lumbar w/o contrast prior to DrDevashwar seeing pt - Victorino DikeJennifer has opening today at 4.45pm Spoke with patient, she is unable to make this appt Per Victorino DikeJennifer, next available is 12.9.15 @ 4.45pm and pt can see DrDevashwar 12.10.15 @ 1pm ([facility], main entrance, ask for radiology dept for both appts) Spoke with patient who is happy with the two appts Nothing further needed; thanked Victorino DikeJennifer for all of her assistance Will sign

## 2014-09-16 NOTE — Telephone Encounter (Signed)
Order was placed on 11.23.15 but apparently was done incorrectly [facility] red[facility] and [facility] - ordered as "urgent" as pt has been waiting over a week Called spoke with patient apologized for mistake and informed her that issue is being taken care of Pt okay with this and voiced her understanding Will continue to monitor order progress  Nothing further needed; will sign off

## 2014-09-17 ENCOUNTER — Telehealth: Payer: Self-pay | Admitting: Adult Health

## 2014-09-17 NOTE — Telephone Encounter (Signed)
Called spoke with patient who reported that she forgot the name of the IR physician she is scheduled to see next week Advised pt she will be seeing Dr [name]  Nothing further needed; will sign off.

## 2014-09-22 ENCOUNTER — Ambulatory Visit (HOSPITAL_COMMUNITY)
Admission: RE | Admit: 2014-09-22 | Discharge: 2014-09-22 | Disposition: A | Discharge status: Home / Self Care | Payer: Medicare Other | Source: Ambulatory Visit | Attending: Adult Health | Admitting: Adult Health

## 2014-09-22 DIAGNOSIS — IMO0002 Reserved for concepts with insufficient information to code with codable children: Secondary | ICD-10-CM

## 2014-09-22 DIAGNOSIS — M4854XA Collapsed vertebra, not elsewhere classified, thoracic region, initial encounter for fracture: Secondary | ICD-10-CM | POA: Insufficient documentation

## 2014-09-23 ENCOUNTER — Ambulatory Visit (HOSPITAL_COMMUNITY)
Admission: RE | Admit: 2014-09-23 | Discharge: 2014-09-23 | Disposition: A | Discharge status: Home / Self Care | Payer: Medicare Other | Source: Ambulatory Visit | Attending: Adult Health | Admitting: Adult Health

## 2014-09-23 DIAGNOSIS — IMO0002 Reserved for concepts with insufficient information to code with codable children: Secondary | ICD-10-CM

## 2014-09-27 ENCOUNTER — Other Ambulatory Visit (HOSPITAL_COMMUNITY): Payer: Self-pay | Admitting: Interventional Radiology

## 2014-09-27 DIAGNOSIS — IMO0002 Reserved for concepts with insufficient information to code with codable children: Secondary | ICD-10-CM

## 2014-09-27 DIAGNOSIS — M549 Dorsalgia, unspecified: Secondary | ICD-10-CM

## 2014-09-27 DIAGNOSIS — S22080A Wedge compression fracture of T11-T12 vertebra, initial encounter for closed fracture: Secondary | ICD-10-CM

## 2014-09-27 DIAGNOSIS — S22070A Wedge compression fracture of T9-T10 vertebra, initial encounter for closed fracture: Secondary | ICD-10-CM

## 2014-09-28 ENCOUNTER — Other Ambulatory Visit: Payer: Self-pay | Admitting: Radiology

## 2014-09-29 ENCOUNTER — Other Ambulatory Visit: Payer: Self-pay | Admitting: Internal Medicine

## 2014-09-30 ENCOUNTER — Ambulatory Visit (HOSPITAL_COMMUNITY)
Admission: RE | Admit: 2014-09-30 | Discharge: 2014-09-30 | Disposition: A | Discharge status: Home / Self Care | Payer: Medicare Other | Source: Ambulatory Visit | Attending: Interventional Radiology | Admitting: Interventional Radiology

## 2014-09-30 ENCOUNTER — Other Ambulatory Visit: Payer: Self-pay | Admitting: Radiology

## 2014-09-30 DIAGNOSIS — Z87891 Personal history of nicotine dependence: Secondary | ICD-10-CM | POA: Diagnosis not present

## 2014-09-30 DIAGNOSIS — Z9981 Dependence on supplemental oxygen: Secondary | ICD-10-CM | POA: Diagnosis not present

## 2014-09-30 DIAGNOSIS — S22079A Unspecified fracture of T9-T10 vertebra, initial encounter for closed fracture: Secondary | ICD-10-CM | POA: Insufficient documentation

## 2014-09-30 DIAGNOSIS — J449 Chronic obstructive pulmonary disease, unspecified: Secondary | ICD-10-CM | POA: Diagnosis not present

## 2014-09-30 DIAGNOSIS — S22089A Unspecified fracture of T11-T12 vertebra, initial encounter for closed fracture: Secondary | ICD-10-CM | POA: Diagnosis not present

## 2014-09-30 DIAGNOSIS — S22070A Wedge compression fracture of T9-T10 vertebra, initial encounter for closed fracture: Secondary | ICD-10-CM

## 2014-09-30 DIAGNOSIS — Z538 Procedure and treatment not carried out for other reasons: Secondary | ICD-10-CM | POA: Insufficient documentation

## 2014-09-30 DIAGNOSIS — K219 Gastro-esophageal reflux disease without esophagitis: Secondary | ICD-10-CM | POA: Diagnosis not present

## 2014-09-30 DIAGNOSIS — F419 Anxiety disorder, unspecified: Secondary | ICD-10-CM | POA: Insufficient documentation

## 2014-09-30 DIAGNOSIS — Y92009 Unspecified place in unspecified non-institutional (private) residence as the place of occurrence of the external cause: Secondary | ICD-10-CM | POA: Insufficient documentation

## 2014-09-30 DIAGNOSIS — J45909 Unspecified asthma, uncomplicated: Secondary | ICD-10-CM | POA: Diagnosis not present

## 2014-09-30 DIAGNOSIS — M549 Dorsalgia, unspecified: Secondary | ICD-10-CM

## 2014-09-30 DIAGNOSIS — X58XXXA Exposure to other specified factors, initial encounter: Secondary | ICD-10-CM | POA: Insufficient documentation

## 2014-09-30 DIAGNOSIS — S22080A Wedge compression fracture of T11-T12 vertebra, initial encounter for closed fracture: Secondary | ICD-10-CM

## 2014-09-30 DIAGNOSIS — I10 Essential (primary) hypertension: Secondary | ICD-10-CM | POA: Diagnosis not present

## 2014-09-30 DIAGNOSIS — Y939 Activity, unspecified: Secondary | ICD-10-CM | POA: Insufficient documentation

## 2014-09-30 LAB — CBC
HCT: 40.3 % (ref 36.0–46.0)
HEMOGLOBIN: 13.8 g/dL (ref 12.0–15.0)
MCH: 30.7 pg (ref 26.0–34.0)
MCHC: 34.2 g/dL (ref 30.0–36.0)
MCV: 89.8 fL (ref 78.0–100.0)
Platelets: 344 10*3/uL (ref 150–400)
RBC: 4.49 MIL/uL (ref 3.87–5.11)
RDW: 14.1 % (ref 11.5–15.5)
WBC: 12.3 10*3/uL — ABNORMAL HIGH (ref 4.0–10.5)

## 2014-09-30 LAB — URINALYSIS, ROUTINE W REFLEX MICROSCOPIC
Bilirubin Urine: NEGATIVE
Glucose, UA: NEGATIVE mg/dL
HGB URINE DIPSTICK: NEGATIVE
Ketones, ur: NEGATIVE mg/dL
LEUKOCYTES UA: NEGATIVE
Nitrite: NEGATIVE
PROTEIN: NEGATIVE mg/dL
Specific Gravity, Urine: 1.009 (ref 1.005–1.030)
Urobilinogen, UA: 0.2 mg/dL (ref 0.0–1.0)
pH: 7 (ref 5.0–8.0)

## 2014-09-30 LAB — PROTIME-INR
INR: 0.98 (ref 0.00–1.49)
PROTHROMBIN TIME: 13.1 s (ref 11.6–15.2)

## 2014-09-30 LAB — BASIC METABOLIC PANEL
ANION GAP: 17 — AB (ref 5–15)
BUN: 15 mg/dL (ref 6–23)
CHLORIDE: 102 meq/L (ref 96–112)
CO2: 22 mEq/L (ref 19–32)
Calcium: 10 mg/dL (ref 8.4–10.5)
Creatinine, Ser: 0.93 mg/dL (ref 0.50–1.10)
GFR calc non Af Amer: 62 mL/min — ABNORMAL LOW (ref >90)
GFR, EST AFRICAN AMERICAN: 72 mL/min — AB (ref >90)
Glucose, Bld: 102 mg/dL — ABNORMAL HIGH (ref 70–99)
Potassium: 3.7 mEq/L (ref 3.7–5.3)
Sodium: 141 mEq/L (ref 137–147)

## 2014-09-30 LAB — APTT: APTT: 29 s (ref 24–37)

## 2014-09-30 MED ORDER — CEFAZOLIN SODIUM-DEXTROSE 2-3 GM-% IV SOLR: 2.0000 g | Freq: Once | INTRAVENOUS | Status: DC

## 2014-09-30 MED ORDER — SODIUM CHLORIDE 0.9 % IV SOLN
Freq: Once | INTRAVENOUS | Status: AC
  Administered 2014-09-30: 07:00:00 via INTRAVENOUS

## 2014-09-30 MED ORDER — CEFAZOLIN SODIUM-DEXTROSE 2-3 GM-% IV SOLR
INTRAVENOUS | Status: AC
  Filled 2014-09-30: qty 50

## 2014-09-30 NOTE — H&P (Signed)
Chief Complaint: Back pain post injury at home T9 and T12 fracture  Referring Physician(s): Aakash Hollomon K  History of Present Illness: [name] is a 68 y.o. female  Acute fractures at Thoracic 9 and 12 Back pain worsens with deep breath,cough, or certain position Consulted with Dr Corliss Skainseveshwar regarding Vertebroplasty/kyphoplasty Now scheduled for T9/T12 VP/KP   Past Medical History  Diagnosis Date  . COPD (chronic obstructive pulmonary disease)   . Anxiety   . Hypertension   . Allergic rhinitis   . Goiter   . Asthma   . Shingles   . Pneumonia 2014  . GERD (gastroesophageal reflux disease)   . Arthritis     "joints" (02/17/2014)  . Chronic lower back pain   . On home oxygen therapy     "1.5L at night" (02/17/2014)  . Postherpetic neuralgia     "d/t shingles"    Past Surgical History  Procedure Laterality Date  . Tubal ligation    . Tonsillectomy    . Wisdom tooth extraction    . Thyroidectomy  12/29/08  . Cataract extraction w/ intraocular lens  implant, bilateral Bilateral     Allergies: Tramadol  Medications: Prior to Admission medications   Medication Sig Start Date End Date Taking? Authorizing Provider  acetaminophen (TYLENOL) 500 MG tablet Take 500 mg by mouth every 4 (four) hours as needed for mild pain, fever or headache. Per bottle as needed for pain/headache   Yes Historical Provider, MD  albuterol (PROAIR HFA) 108 (90 BASE) MCG/ACT inhaler Inhale 2 puffs into the lungs every 4 (four) hours as needed for wheezing or shortness of breath.   Yes Historical Provider, MD  b complex vitamins tablet Take 1 tablet by mouth daily. 02/26/14  Yes Aleksei Plotnikov V, MD  Calcium Carbonate-Vitamin D (CALCIUM-VITAMIN D) 500-200 MG-UNIT per tablet Take 1 tablet by mouth daily.    Yes Historical Provider, MD  cetirizine (ZYRTEC) 10 MG tablet Take 10 mg by mouth at bedtime as needed for allergies.    Yes Historical Provider, MD  cholecalciferol (VITAMIN D) 1000  UNITS tablet Take 1,000 Units by mouth daily.    Yes Historical Provider, MD  famotidine (PEPCID) 20 MG tablet Take 20 mg by mouth at bedtime.   Yes Historical Provider, MD  mometasone-formoterol (DULERA) 200-5 MCG/ACT AERO Inhale 2 puffs into the lungs 2 (two) times daily. (((PLAN A)))   Yes Historical Provider, MD  NON FORMULARY Oxygen  2 L at bedtime   Yes Historical Provider, MD  omeprazole (PRILOSEC) 40 MG capsule TAKE 1 CAPSULE BY MOUTH 30-60 MINUTES BEFORE FIRST AND LAST MEAL OF THE DAY   Yes Nyoka CowdenMichael B Wert, MD  omeprazole (PRILOSEC) 40 MG capsule Take 40 mg by mouth daily.   Yes Historical Provider, MD  predniSONE (DELTASONE) 5 MG tablet Take 2.5 mg by mouth daily with breakfast.    Yes Historical Provider, MD  predniSONE (DELTASONE) 5 MG tablet TAKE AS DIRECTED 09/29/14  Yes Nyoka CowdenMichael B Wert, MD  PROAIR HFA 108 (90 BASE) MCG/ACT inhaler INHALE 2 PUFFS INTO LUNGS EVERY 4 HOURS AS NEEDED WHEEZING 04/28/14  Yes Nyoka CowdenMichael B Wert, MD  Respiratory Therapy Supplies (FLUTTER) DEVI Use as directed 08/20/14  Yes Nyoka CowdenMichael B Wert, MD  tiotropium (SPIRIVA HANDIHALER) 18 MCG inhalation capsule Place 1 capsule (18 mcg total) into inhaler and inhale daily. (PLAN A) 12/02/12  Yes Nyoka CowdenMichael B Wert, MD  verapamil (VERELAN PM) 180 MG 24 hr capsule TAKE 1 CAPSULE (180 MG TOTAL) BY MOUTH AT  BEDTIME.   Yes Aleksei Plotnikov V, MD  verapamil (VERELAN PM) 180 MG 24 hr capsule Take 180 mg by mouth at bedtime.   Yes Historical Provider, MD  dextromethorphan-guaiFENesin (MUCINEX DM) 30-600 MG per 12 hr tablet Take 1-2 tablets by mouth 2 (two) times daily as needed (w/ flutter valve).     Historical Provider, MD  diazepam (VALIUM) 5 MG tablet Take 5 mg by mouth every 6 (six) hours as needed for anxiety.    Historical Provider, MD  HYDROcodone-acetaminophen (NORCO/VICODIN) 5-325 MG per tablet Take 1 tablet by mouth every 4 (four) hours as needed for moderate pain (or cough). 09/03/14   Tammy S Parrett, NP  polyethylene glycol  (MIRALAX / GLYCOLAX) packet Take 17 g by mouth daily.    Historical Provider, MD  PROAIR HFA 108 (90 BASE) MCG/ACT inhaler INHALE 2 PUFFS INTO LUNGS EVERY 4 HOURS AS NEEDED WHEEZING 09/10/14   Nyoka Cowden, MD    Family History  Problem Relation Age of Onset  . Hypertension Father     History   Social History  . Marital Status: Married    Spouse Name: N/A    Number of Children: N/A  . Years of Education: N/A   Occupational History  . cashier     [employer]   Social History Main Topics  . Smoking status: Former Smoker -- 1.00 packs/day for 40 years    Types: Cigarettes    Quit date: 04/01/2005  . Smokeless tobacco: Never Used  . Alcohol Use: 37.8 oz/week    63 Shots of liquor per week     Comment: 02/17/2014 "3 shots liquor, 3 times/day"--- stopped 5/15  . Drug Use: No  . Sexual Activity: No   Other Topics Concern  . Not on file   Social History Narrative     Review of Systems: A 12 point ROS discussed and pertinent positives are indicated in the HPI above.  All other systems are negative.  Review of Systems  Constitutional: Positive for activity change and appetite change. Negative for fever.  Respiratory: Positive for cough, chest tightness and shortness of breath.   Cardiovascular: Negative for chest pain.  Gastrointestinal: Negative for abdominal pain.  Genitourinary: Negative for difficulty urinating.  Musculoskeletal: Positive for back pain and gait problem.  Neurological: Positive for weakness.  Psychiatric/Behavioral: Negative for behavioral problems and confusion.    Vital Signs: BP 131/98 mmHg  Pulse 70  Temp(Src) 98.2 F (36.8 C) (Oral)  Resp 18  Ht 5' 1.5" (1.562 m)  Wt 61.236 kg (135 lb)  BMI 25.10 kg/m2  SpO2 95%  Physical Exam  Constitutional: She is oriented to person, place, and time. She appears well-nourished.  Cardiovascular: Normal rate, regular rhythm and normal heart sounds.   No murmur heard. Pulmonary/Chest: Effort normal and  breath sounds normal. No respiratory distress. She has no wheezes.  Abdominal: Soft. Bowel sounds are normal. There is no tenderness.  Musculoskeletal: Normal range of motion.  Back pain  Neurological: She is alert and oriented to person, place, and time.  Skin: Skin is warm and dry.  Psychiatric: She has a normal mood and affect. Her behavior is normal. Judgment and thought content normal.  Nursing note and vitals reviewed.   Imaging: Mr Lumbar Spine Wo Contrast  09/22/2014   CLINICAL DATA:  Compression fractures at T9 and T10.  EXAM: MRI LUMBAR SPINE WITHOUT CONTRAST  TECHNIQUE: Multiplanar, multisequence MR imaging of the lumbar spine was performed. No intravenous contrast was administered.  COMPARISON:  Thoracic spine radiographs 08/26/2014.  FINDINGS: Normal signal is present in the thoracic spine to the conus medullaris which terminates at L2, and normal limit. Superior endplate compression fractures are present at T9, T10, T11, and T12. The fractures at T10 and T11 are healed. The superior endplate fracture at T9 demonstrates diffuse edema. There superior endplate edema in the T12 fracture. There is no significant retropulsion of bone although there is some endplate changes at the sites.  Limited imaging of the abdomen demonstrates a benign appearing cyst in the left kidney, incompletely imaged. No other focal lesions are present. There is no significant adenopathy.  T8-9: Endplate changes results in mild foraminal narrowing. The central canal is patent.  T9-10:  Negative.  T10-11: Endplate changes and facet hypertrophy results in moderate foraminal narrowing bilaterally, worse on the right.  T11-12:  Negative.  T12-L1:  Negative.  L1-2:  Negative.  L2-3:  Negative.  L3-4:  Negative.  L4-5:  Negative.  L5-S1: There is chronic loss of disc height. No significant focal protrusion or stenosis is evident.  IMPRESSION: 1. Acute/subacute superior endplate compression fracture at T9 with edema throughout  the vertebral body extending into the pedicles bilaterally. 2. Superior endplate fractures at T10 and T11 are healed. 3. Superior endplate fracture at T12 is subacute with some residual edema in the endplate. 4. Moderate foraminal stenosis bilaterally at T10-11 secondary endplate spurring and mild facet hypertrophy. This is worse on the right. 5. Mild foraminal narrowing bilaterally at T8-9. 6. Chronic loss of disc height at L5-S1 with endplate marrow changes but no significant posterior disc protrusion or stenosis.   Electronically Signed   By: Gennette Pachris  Mattern M.D.   On: 09/22/2014 19:01   [facility] Radiologist Eval & Mgmt  09/28/2014   EXAM: NEW PATIENT OFFICE VISIT  CHIEF COMPLAINT: Severe lower thoracic pain secondary to compression fractures at T9 and T12.  Current Pain Level: 1-10  HISTORY OF PRESENT ILLNESS: The patient is a 68 year old lady who has been referred for evaluation for severe pain relief due to fractures at T9 and T12. The patient is accompanied by her husband.  They both report that the patient initially started having severe pain following her attempting to open a window pane while stooping over. She claims that she heard a pop followed by progressively worsening pain in the lower thoracic to upper thoracic region.  At the present time, the pain is an 8 out of 10 mostly localized in the midline lower thoracic region.  There is occasional radiation circumferentially around the chest. No radiation into the lower extremities or into the upper extremities.  She says the pain is somewhat relieved by taking a tablet of hydrocodone. Of late however this too has had a lesser of a pain relieving effect.  She denies any autonomic dysfunction of her bowel or bladder.  Her appetite remains normal.  Her weight is steady.  The patient does complain of which she describes a shortness of breath and increased use of her inhaler since the fracture.  Past Medical History: Asthma, emphysema, COPD. High blood pressure.  Osteoporosis. Recent bout of left-sided facial shingles.  Previous surgeries:  Tubal ligation 1971.  Thyroid surgery 2012.  Medications: Spiriva inhaler. Dulera. B-complex and vitamin-C. Verapamil for her hypertension. Calcium supplements. Vitamin-D. Prednisone. Omeprazole. Oxygen at nighttime. Pepcid. Occasional on a need to basis diazepam. Tylenol. Miralax for constipation.  Allergies: Tramadol causes confusion.  Social History: Married lives with her husband. One son healthy. Denies smoking or  use alcohol. Denies any illicit use of chemicals.  Family History: Brother with diabetes. Heart problems. High blood pressure. Both parents deceased due to heart failure.  REVIEW OF SYSTEMS: Essentially as above otherwise negative for pathologic symptomatology.  PHYSICAL EXAMINATION: Affect normal. However appears to be mildly in distress on account of the pain with shallow raspy breath sounds.  Neurologically intact. Alert, awake, oriented to time, place, space. No cranial nerve abnormalities noted. Motor and sensory, grossly no lateralized features. Station and gait not tested. Exquisitely tender over the T9 and T12 areas on palpation.  ASSESSMENT AND PLAN: The patient's recent MRI scan of the thoracic and lumbar spine was reviewed with the patient and the patient's spouse. These demonstrate compression wedge-shaped deformities at T9 and T12 with signal abnormalities suggestive of these being acute to subacute areas of fractures. Old healed fractures seen at T10 and T11. No significant retropulsion seen.  The option of vertebral body augmentation for pain relief, and also to prevent further collapse was discussed in detail. The procedure, benefits, the alternatives were all reviewed. The procedure was discussed in detail.  Both the patient and her spouse would like to proceed with the vertebral body augmentation most likely kyphoplasty at the 2 levels, T9 and T12. These will be scheduled at the earliest possible.  In the  meantime she has been advised to use a walker when ambulating. Also to refrain from stooping bending or lifting weights above 10 lb. Both the patient and her spouse leave with good understanding and agreement with the above management plan.   Electronically Signed   By: Julieanne Cotton M.D.   On: 09/23/2014 14:28    Labs:  CBC:  Recent Labs  02/17/14 1107 02/17/14 1650 02/19/14 0647 09/30/14 0644  WBC 13.1* 14.2* 7.9 12.3*  HGB 15.7* 15.4* 12.5 13.8  HCT 44.0 44.1 37.1 40.3  PLT 300 286 246 344    COAGS:  Recent Labs  09/30/14 0644  INR 0.98  APTT 29    BMP:  Recent Labs  02/17/14 1107 02/17/14 1650 02/19/14 0647 09/03/14 1305 09/30/14 0644  NA 139  --  143 136 141  K 3.5*  --  3.3* 4.3 3.7  CL 101  --  106 102 102  CO2 18*  --  19 24 22   GLUCOSE 101*  --  78 130* 102*  BUN 20  --  16 17 15   CALCIUM 9.8  --  9.1 9.9 10.0  CREATININE 1.29* 1.22* 1.05 1.2 0.93  GFRNONAA 42* 45* 54*  --  62*  GFRAA 49* 52* 63*  --  72*    LIVER FUNCTION TESTS:  Recent Labs  02/17/14 1107  BILITOT 1.0  AST 20  ALT 14  ALKPHOS 82  PROT 7.5  ALBUMIN 3.6    TUMOR MARKERS: No results for input(s): AFPTM, CEA, CA199, CHROMGRNA in the last 8760 hours.  Assessment and Plan:  Sudden back pain after lifting window at home recently MRI reveals acute T9 and T12 fx Now scheduled for VP/KP Pt aware of procedure benefits and risks and agreeable to proceed Consent signed andin chart WBC noted 12.3---checking UA (on Prednisone)  Thank you for this interesting consult.  I greatly enjoyed meeting Cheyna Curran and look forward to participating in their care.     I spent a total of 20 minutes face to face in clinical consultation, greater than 50% of which was counseling/coordinating care for T9 and T12 VP/KP  Signed: TURPIN,PAMELA A 09/30/2014, 11:49 AM

## 2014-09-30 NOTE — Progress Notes (Signed)
Patient ID: Laura MelnickMelody Tramontana, female   DOB: 01-22-1946, 68 y.o.   MRN: [id number]  Secondary emergent cases in IR VP/KP T9 and T12 must be rescheduled to 12/18 [facility] 11 am 12/18 Npo; etc.  She has good understanding and agreeable

## 2014-10-01 ENCOUNTER — Other Ambulatory Visit (HOSPITAL_COMMUNITY): Payer: Self-pay | Admitting: Interventional Radiology

## 2014-10-01 ENCOUNTER — Encounter (HOSPITAL_COMMUNITY): Payer: Self-pay

## 2014-10-01 ENCOUNTER — Ambulatory Visit (HOSPITAL_COMMUNITY)
Admission: RE | Admit: 2014-10-01 | Discharge: 2014-10-01 | Disposition: A | Discharge status: Home / Self Care | Payer: Medicare Other | Source: Ambulatory Visit | Attending: Interventional Radiology | Admitting: Interventional Radiology

## 2014-10-01 DIAGNOSIS — M549 Dorsalgia, unspecified: Secondary | ICD-10-CM

## 2014-10-01 DIAGNOSIS — Z79899 Other long term (current) drug therapy: Secondary | ICD-10-CM | POA: Diagnosis not present

## 2014-10-01 DIAGNOSIS — F419 Anxiety disorder, unspecified: Secondary | ICD-10-CM | POA: Insufficient documentation

## 2014-10-01 DIAGNOSIS — S22080A Wedge compression fracture of T11-T12 vertebra, initial encounter for closed fracture: Secondary | ICD-10-CM

## 2014-10-01 DIAGNOSIS — I10 Essential (primary) hypertension: Secondary | ICD-10-CM | POA: Diagnosis not present

## 2014-10-01 DIAGNOSIS — J45909 Unspecified asthma, uncomplicated: Secondary | ICD-10-CM | POA: Diagnosis not present

## 2014-10-01 DIAGNOSIS — S22070A Wedge compression fracture of T9-T10 vertebra, initial encounter for closed fracture: Secondary | ICD-10-CM

## 2014-10-01 DIAGNOSIS — J449 Chronic obstructive pulmonary disease, unspecified: Secondary | ICD-10-CM | POA: Diagnosis not present

## 2014-10-01 DIAGNOSIS — Z9981 Dependence on supplemental oxygen: Secondary | ICD-10-CM | POA: Insufficient documentation

## 2014-10-01 DIAGNOSIS — M1990 Unspecified osteoarthritis, unspecified site: Secondary | ICD-10-CM | POA: Diagnosis not present

## 2014-10-01 DIAGNOSIS — K219 Gastro-esophageal reflux disease without esophagitis: Secondary | ICD-10-CM | POA: Diagnosis not present

## 2014-10-01 DIAGNOSIS — M4854XA Collapsed vertebra, not elsewhere classified, thoracic region, initial encounter for fracture: Secondary | ICD-10-CM | POA: Insufficient documentation

## 2014-10-01 MED ORDER — HYDROCODONE-ACETAMINOPHEN 5-325 MG PO TABS
ORAL_TABLET | ORAL | Status: AC
  Administered 2014-10-01: 2 via ORAL
  Filled 2014-10-01: qty 2

## 2014-10-01 MED ORDER — CEFAZOLIN (ANCEF) 1 G IV SOLR
INTRAVENOUS | Status: AC | PRN
  Administered 2014-10-01: 2 g

## 2014-10-01 MED ORDER — MIDAZOLAM HCL 2 MG/2ML IJ SOLN
INTRAMUSCULAR | Status: AC | PRN
  Administered 2014-10-01: 1 mg via INTRAVENOUS
  Administered 2014-10-01: 0.5 mg via INTRAVENOUS
  Administered 2014-10-01 (×2): 1 mg via INTRAVENOUS

## 2014-10-01 MED ORDER — FENTANYL CITRATE 0.05 MG/ML IJ SOLN
INTRAMUSCULAR | Status: AC
  Filled 2014-10-01: qty 4

## 2014-10-01 MED ORDER — CEFAZOLIN SODIUM-DEXTROSE 2-3 GM-% IV SOLR
INTRAVENOUS | Status: AC
Start: 2014-10-01 — End: 2014-10-01
  Filled 2014-10-01: qty 50

## 2014-10-01 MED ORDER — HYDROMORPHONE HCL 1 MG/ML IJ SOLN
INTRAMUSCULAR | Status: AC | PRN
  Administered 2014-10-01: 1 mg via INTRAVENOUS

## 2014-10-01 MED ORDER — GELATIN ABSORBABLE 12-7 MM EX MISC
CUTANEOUS | Status: AC
  Filled 2014-10-01: qty 1

## 2014-10-01 MED ORDER — BUPIVACAINE HCL (PF) 0.25 % IJ SOLN
INTRAMUSCULAR | Status: AC
  Filled 2014-10-01: qty 30

## 2014-10-01 MED ORDER — HYDRALAZINE HCL 20 MG/ML IJ SOLN
INTRAMUSCULAR | Status: AC
  Filled 2014-10-01: qty 1

## 2014-10-01 MED ORDER — HYDROCODONE-ACETAMINOPHEN 5-325 MG PO TABS
2.0000 | ORAL_TABLET | Freq: Once | ORAL | Status: AC
  Administered 2014-10-01: 2 via ORAL

## 2014-10-01 MED ORDER — TOBRAMYCIN SULFATE 1.2 G IJ SOLR
INTRAMUSCULAR | Status: AC
  Filled 2014-10-01: qty 1.2

## 2014-10-01 MED ORDER — MIDAZOLAM HCL 2 MG/2ML IJ SOLN
INTRAMUSCULAR | Status: AC
  Filled 2014-10-01: qty 4

## 2014-10-01 MED ORDER — HYDROMORPHONE HCL 1 MG/ML IJ SOLN
INTRAMUSCULAR | Status: AC
  Filled 2014-10-01: qty 2

## 2014-10-01 MED ORDER — SODIUM CHLORIDE 0.9 % IV SOLN: INTRAVENOUS | Status: AC

## 2014-10-01 MED ORDER — FENTANYL CITRATE 0.05 MG/ML IJ SOLN
INTRAMUSCULAR | Status: AC | PRN
  Administered 2014-10-01: 12.5 ug via INTRAVENOUS
  Administered 2014-10-01 (×3): 25 ug via INTRAVENOUS

## 2014-10-01 NOTE — Sedation Documentation (Signed)
Tried to wean pt off oxygen but sats were 87% on room air, so placed back to 1.5L

## 2014-10-01 NOTE — Progress Notes (Signed)
Referring Physician(s): Caria Transue K  Subjective:  Pt was scheduled yesterday for T9 and T12 VP/KP Had to be rescheduled secondary emergent cases in IR Now here for procedure 12.3 wbc (on pred---ua clear from 09/30/14)  Allergies: Tramadol  Medications: Prior to Admission medications   Medication Sig Start Date End Date Taking? Authorizing Provider  acetaminophen (TYLENOL) 500 MG tablet Take 500 mg by mouth every 4 (four) hours as needed for mild pain, fever or headache. Per bottle as needed for pain/headache    Historical Provider, MD  albuterol (PROAIR HFA) 108 (90 BASE) MCG/ACT inhaler Inhale 2 puffs into the lungs every 4 (four) hours as needed for wheezing or shortness of breath.    Historical Provider, MD  b complex vitamins tablet Take 1 tablet by mouth daily. 02/26/14   Aleksei Plotnikov V, MD  Calcium Carbonate-Vitamin D (CALCIUM-VITAMIN D) 500-200 MG-UNIT per tablet Take 1 tablet by mouth daily.     Historical Provider, MD  cetirizine (ZYRTEC) 10 MG tablet Take 10 mg by mouth at bedtime as needed for allergies.     Historical Provider, MD  cholecalciferol (VITAMIN D) 1000 UNITS tablet Take 1,000 Units by mouth daily.     Historical Provider, MD  dextromethorphan-guaiFENesin (MUCINEX DM) 30-600 MG per 12 hr tablet Take 1-2 tablets by mouth 2 (two) times daily as needed (w/ flutter valve).     Historical Provider, MD  diazepam (VALIUM) 5 MG tablet Take 5 mg by mouth every 6 (six) hours as needed for anxiety.    Historical Provider, MD  famotidine (PEPCID) 20 MG tablet Take 20 mg by mouth at bedtime.    Historical Provider, MD  HYDROcodone-acetaminophen (NORCO/VICODIN) 5-325 MG per tablet Take 1 tablet by mouth every 4 (four) hours as needed for moderate pain (or cough). 09/03/14   Tammy S Parrett, NP  mometasone-formoterol (DULERA) 200-5 MCG/ACT AERO Inhale 2 puffs into the lungs 2 (two) times daily. (((PLAN A)))    Historical Provider, MD  NON FORMULARY Oxygen  2 L at  bedtime    Historical Provider, MD  omeprazole (PRILOSEC) 40 MG capsule TAKE 1 CAPSULE BY MOUTH 30-60 MINUTES BEFORE FIRST AND LAST MEAL OF THE DAY    Nyoka CowdenMichael B Wert, MD  omeprazole (PRILOSEC) 40 MG capsule Take 40 mg by mouth daily.    Historical Provider, MD  polyethylene glycol (MIRALAX / GLYCOLAX) packet Take 17 g by mouth daily.    Historical Provider, MD  predniSONE (DELTASONE) 5 MG tablet Take 2.5 mg by mouth daily with breakfast.     Historical Provider, MD  predniSONE (DELTASONE) 5 MG tablet TAKE AS DIRECTED 09/29/14   Nyoka CowdenMichael B Wert, MD  PROAIR HFA 108 (90 BASE) MCG/ACT inhaler INHALE 2 PUFFS INTO LUNGS EVERY 4 HOURS AS NEEDED WHEEZING 04/28/14   Nyoka CowdenMichael B Wert, MD  PROAIR HFA 108 [phone](90 BASE) MCG/ACT inhaler INHALE 2 PUFFS INTO LUNGS EVERY 4 HOURS AS NEEDED WHEEZING 09/10/14   Nyoka CowdenMichael B Wert, MD  Respiratory Therapy Supplies (FLUTTER) DEVI Use as directed 08/20/14   Nyoka CowdenMichael B Wert, MD  tiotropium (SPIRIVA HANDIHALER) 18 MCG inhalation capsule Place 1 capsule (18 mcg total) into inhaler and inhale daily. (PLAN A) 12/02/12   Nyoka CowdenMichael B Wert, MD  verapamil (VERELAN PM) 180 MG 24 hr capsule TAKE 1 CAPSULE (180 MG TOTAL) BY MOUTH AT BEDTIME.    Aleksei Plotnikov V, MD  verapamil (VERELAN PM) 180 MG 24 hr capsule Take 180 mg by mouth at bedtime.    Historical Provider,  MD    Review of Systems  Vital Signs: BP 154/88 mmHg  Pulse 98  Temp(Src) 98.4 F (36.9 C) (Oral)  Resp 24  SpO2 95%  Physical Exam  Constitutional: She is oriented to person, place, and time. She appears well-nourished.  Cardiovascular: Normal rate, regular rhythm and normal heart sounds.   No murmur heard. Pulmonary/Chest: Effort normal and breath sounds normal. She has no wheezes.  Abdominal: Soft. Bowel sounds are normal. There is no tenderness.  Musculoskeletal: Normal range of motion.  Back pain  Neurological: She is alert and oriented to person, place, and time.  Skin: Skin is warm and dry.  Psychiatric: She has  a normal mood and affect. Her behavior is normal. Judgment and thought content normal.    Imaging: No results found.  Labs:  CBC:  Recent Labs  02/17/14 1107 02/17/14 1650 02/19/14 0647 09/30/14 0644  WBC 13.1* 14.2* 7.9 12.3*  HGB 15.7* 15.4* 12.5 13.8  HCT 44.0 44.1 37.1 40.3  PLT 300 286 246 344    COAGS:  Recent Labs  09/30/14 0644  INR 0.98  APTT 29    BMP:  Recent Labs  02/17/14 1107 02/17/14 1650 02/19/14 0647 09/03/14 1305 09/30/14 0644  NA 139  --  143 136 141  K 3.5*  --  3.3* 4.3 3.7  CL 101  --  106 102 102  CO2 18*  --  19 24 22   GLUCOSE 101*  --  78 130* 102*  BUN 20  --  16 17 15   CALCIUM 9.8  --  9.1 9.9 10.0  CREATININE 1.29* 1.22* 1.05 1.2 0.93  GFRNONAA 42* 45* 54*  --  62*  GFRAA 49* 52* 63*  --  72*    LIVER FUNCTION TESTS:  Recent Labs  02/17/14 1107  BILITOT 1.0  AST 20  ALT 14  ALKPHOS 82  PROT 7.5  ALBUMIN 3.6    Assessment and Plan:  Acute Thoracic 9 and Thoracic 12 fractures Scheduled now for VP/KP Pt aware of procedure benefits and risks and agreeable to proceed Consent signed andin chart   I spent a total of 20 minutes face to face in clinical consultation/evaluation, greater than 50% of which was counseling/coordinating care for T9 and T12 VP/KP  Signed: TURPIN,PAMELA A 10/01/2014, 12:05 PM

## 2014-10-01 NOTE — Sedation Documentation (Signed)
Patient denies pain and is resting comfortably.  

## 2014-10-01 NOTE — Sedation Documentation (Signed)
No change to dressings to back.  Upper dressing w/ drainage marked, scant amount noted and unchanged.  Lower dressing dry and intact.

## 2014-10-01 NOTE — Discharge Instructions (Signed)
1.No stooping,bending or lifting more than 10lbs for 2 weeks. 2.Use walker for 2 weeks.  3.RTC in 2 weeks KYPHOPLASTY/VERTEBROPLASTY DISCHARGE INSTRUCTIONS  Medications: (check all that apply)     Resume all home medications as before procedure.                       Continue your pain medications as prescribed as needed.  Over the next 3-5 days, decrease your pain medication as tolerated.  Over the counter medications (i.e. Tylenol, ibuprofen, and aleve) may be substituted once severe/moderate pain symptoms have subsided.   Wound Care: - Bandages may be removed the day following your procedure.  You may get your incision wet once bandages are removed.  Bandaids may be used to cover the incisions until scab formation.  Topical ointments are optional.  - If you develop a fever greater than 101 degrees, have increased skin redness at the incision sites or pus-like oozing from incisions occurring within 1 week of the procedure, contact radiology at [phone] or [phone].  - Ice pack to back for 15-20 minutes 2-3 time per day for first 2-3 days post procedure.  The ice will expedite muscle healing and help with the pain from the incisions.   Activity: - Bedrest today with limited activity for 24 hours post procedure.  - No driving for 48 hours.  - Increase your activity as tolerated after bedrest (with assistance if necessary).  - Refrain from any strenuous activity or heavy lifting (greater than 10 lbs.).   Follow up: - Contact radiology at [phone] or [phone] if any questions/concerns.  - A physician assistant from radiology will contact you in approximately 1 week.  - If a biopsy was performed at the time of your procedure, your referring physician should receive the results in usually 2-3 days.        KYPHOPLASTY/VERTEBROPLASTY DISCHARGE INSTRUCTIONS  Medications: (check all that apply)     Resume all home medications as before procedure.                       Continue your pain medications as prescribed as needed.  Over the next 3-5 days, decrease your pain medication as tolerated.  Over the counter medications (i.e. Tylenol, ibuprofen, and aleve) may be substituted once severe/moderate pain symptoms have subsided.   Wound Care: - Bandages may be removed the day following your procedure.  You may get your incision wet once bandages are removed.  Bandaids may be used to cover the incisions until scab formation.  Topical ointments are optional.  - If you develop a fever greater than 101 degrees, have increased skin redness at the incision sites or pus-like oozing from incisions occurring within 1 week of the procedure, contact radiology at [phone] or [phone].  - Ice pack to back for 15-20 minutes 2-3 time per day for first 2-3 days post procedure.  The ice will expedite muscle healing and help with the pain from the incisions.   Activity: - Bedrest today with limited activity for 24 hours post procedure.  - No driving for 48 hours.  - Increase your activity as tolerated after bedrest (with assistance if necessary).  - Refrain from any strenuous activity or heavy lifting (greater than 10 lbs.).   Follow up: - Contact radiology at [phone] or [phone] if any questions/concerns.  - A physician assistant from radiology will contact you in approximately 1 week.  - If a biopsy was performed at the  time of your procedure, your referring physician should receive the results in usually 2-3 days.

## 2014-10-01 NOTE — Sedation Documentation (Signed)
Pt resting peacefully, easily awakens, no complaints.

## 2014-10-01 NOTE — Sedation Documentation (Signed)
Pt's current stated pain level is 3/10 in prone position on IR procedure table.

## 2014-10-01 NOTE — Sedation Documentation (Signed)
Pt transferred to nurses station, husband at side, placed to [facility] for Sats falling to 89% intermittently. Denies pain.

## 2014-10-01 NOTE — Procedures (Signed)
S/P T ( and T 10 balloon KPs with biopsies

## 2014-10-01 NOTE — Sedation Documentation (Addendum)
Advised Dr. Corliss Skainseveshwar of elevated BP, recent pain meds and O2 level on 3LNC.

## 2014-10-01 NOTE — Sedation Documentation (Signed)
Notified Dr. Corliss Skainseveshwar that pt's Bp was going back up and she was grimacing w/ hammering now and becoming more awake.

## 2014-10-01 NOTE — Sedation Documentation (Signed)
Dr. Corliss Skainseveshwar in to s/w pt/husband. Pt sats 90-91% on room air.  Will watch in rad nurses station for 30min per MD.

## 2014-10-05 ENCOUNTER — Other Ambulatory Visit: Payer: Self-pay | Admitting: *Deleted

## 2014-10-05 MED ORDER — VERAPAMIL HCL ER 180 MG PO CP24: 180.0000 mg | ORAL_CAPSULE | Freq: Every day | ORAL | Status: DC

## 2014-10-11 ENCOUNTER — Other Ambulatory Visit (HOSPITAL_COMMUNITY): Payer: Self-pay | Admitting: Interventional Radiology

## 2014-10-11 DIAGNOSIS — IMO0002 Reserved for concepts with insufficient information to code with codable children: Secondary | ICD-10-CM

## 2014-10-11 DIAGNOSIS — M549 Dorsalgia, unspecified: Secondary | ICD-10-CM

## 2014-10-15 LAB — HM MAMMOGRAPHY

## 2014-10-20 ENCOUNTER — Ambulatory Visit (HOSPITAL_COMMUNITY): Admission: RE | Admit: 2014-10-20 | Payer: Medicare Other | Source: Ambulatory Visit

## 2014-10-21 ENCOUNTER — Telehealth: Payer: Self-pay | Admitting: Internal Medicine

## 2014-10-21 MED ORDER — BUDESONIDE-FORMOTEROL FUMARATE 160-4.5 MCG/ACT IN AERO: 2.0000 | INHALATION_SPRAY | Freq: Two times a day (BID) | RESPIRATORY_TRACT | Status: DC

## 2014-10-21 NOTE — Telephone Encounter (Signed)
Called and spoke to pt. Pt stated [facility] but will cover Symbicort. Pt is requesting the rx be sent to [facility] on [address].   Dr. Sherene SiresWert please advise.

## 2014-10-21 NOTE — Telephone Encounter (Signed)
Called pt and is aware RX sent in. Nothing further needed 

## 2014-10-21 NOTE — Telephone Encounter (Signed)
symbicort 160 2bid  

## 2014-10-27 ENCOUNTER — Ambulatory Visit (HOSPITAL_COMMUNITY)
Admission: RE | Admit: 2014-10-27 | Discharge: 2014-10-27 | Disposition: A | Discharge status: Home / Self Care | Payer: Medicare Other | Source: Ambulatory Visit | Attending: Interventional Radiology | Admitting: Interventional Radiology

## 2014-10-27 DIAGNOSIS — IMO0002 Reserved for concepts with insufficient information to code with codable children: Secondary | ICD-10-CM

## 2014-10-27 DIAGNOSIS — M549 Dorsalgia, unspecified: Secondary | ICD-10-CM

## 2014-11-09 ENCOUNTER — Encounter: Payer: Self-pay | Admitting: Internal Medicine

## 2014-11-09 ENCOUNTER — Ambulatory Visit (INDEPENDENT_AMBULATORY_CARE_PROVIDER_SITE_OTHER): Payer: Medicare Other | Admitting: Internal Medicine

## 2014-11-09 VITALS — BP 132/80 | HR 86 | Ht 61.5 in | Wt 134.0 lb

## 2014-11-09 DIAGNOSIS — J449 Chronic obstructive pulmonary disease, unspecified: Secondary | ICD-10-CM

## 2014-11-09 DIAGNOSIS — J9611 Chronic respiratory failure with hypoxia: Secondary | ICD-10-CM

## 2014-11-09 MED ORDER — DIAZEPAM 5 MG PO TABS: 5.0000 mg | ORAL_TABLET | Freq: Four times a day (QID) | ORAL | Status: DC | PRN

## 2014-11-09 MED ORDER — TIOTROPIUM BROMIDE MONOHYDRATE 2.5 MCG/ACT IN AERS: INHALATION_SPRAY | RESPIRATORY_TRACT | Status: DC

## 2014-11-09 MED ORDER — TIOTROPIUM BROMIDE MONOHYDRATE 2.5 MCG/ACT IN AERS: INHALATION_SPRAY | RESPIRATORY_TRACT | Status: AC

## 2014-11-09 NOTE — Progress Notes (Signed)
Subjective:     Patient ID: Laura MelnickMelody [name], female   DOB: 01-24-1946    MRN: [id number]    Brief patient profile:  [id number] yowf who quit smoking in December 2007, with GOLD II copd copd dx 06/08/13  with minimum asthmatic component  baseline = grocery store ok, tends to avoid mall or superstores and [facility]/ steroid dep since July 2010     History of Present Illness  06/08/2013 f/u ov/[name] on pred 5 mg one daily and never need neb, avg saba sev times per day Chief Complaint  Patient presents with  . Followup with cxr and PFT    Pt states having increased SOB and cough since her last visit- relates this to hot, humid weather.  Cough is non prod and worsens when she goes out in the heat.   can still do [employer] s 02 on a good day but worse x sev weeks despite maint rx including pred at 5 mg daily assoc with worsening dry cough- rare saba hfa use  rec Stop losartan as your blood pressure is low > we may need to consider changing to alternative medication due to high heart rate     07/23/2014 f/u ov/[name] re: GOLD II/ steroid dep at 5mg  floor (never tried the 2.5 mg rec at last ov) dulera/spriva Chief Complaint  Patient presents with  . Follow-up    Pt states having increased SOB and cough for the past 3 months. Cough is prod with minimal clear sputum. She states that she is SOB walking from room to room. She is using proair 3-4 times per day.   not using 02 with exertion as rec rec Use 02 with exertion Follow med calendar       08/20/2014 f/u ov/[name] re: GOLD II but steroid / 02 dep  Chief Complaint  Patient presents with  . Acute Visit    cough is worsenening, SOB, DOE, some brownish tint phlegm, tightness comes and goes in chest, rattling sound with breathing   not following action plan per med calendar except related to pred rx at 20 mg daily with floor of 5 mg daily, gradually worse sob and cough since last ov  >>augmentin rx   09/03/14 Follow up  Returns for  follow up and med review  Reports some increased SOB pt attributes to new compression fractures Has seen PCP with back pain , dx w/ compression fx.  Seen last ov with AECOPD , tx w/ augmentin and pred burst , feeling some better.  We reviewed all her medications and asked them into a medication calendar .  rec Finish Augmentin as discussed.  Taper Prednisone per Med Calendar.  Use pain meds cautiously as they cause you to be sleepy.  Follow med calendar closely and bring to each visit.    11/09/2014 f/u ov/[name] re: prednisone 5 mg  One half daily  Chief Complaint  Patient presents with  . Follow-up    Pt states that her SOB to get worse and her cough is no better. She is using proair about 10 x per day.   once gets up at usual hour  Does not use symbicort first thing as rec / no better on higher prednisone so tapered back down. Not using valium at all "I ran out" and def worse sinc no longer taking   No obvious day to day or daytime variabilty or assoc excess or purulent sputum  or cp or chest tightness, subjective wheeze overt sinus  or hb symptoms. No unusual exp hx or h/o childhood pna/ asthma or knowledge of premature birth.  Sleeping ok without nocturnal  or early am exacerbation  of respiratory  c/o's or need for noct saba. Also denies any obvious fluctuation of symptoms with weather or environmental changes or other aggravating or alleviating factors except as outlined above   Current Medications, Allergies, Complete Past Medical History, Past Surgical History, Family History, and Social History were reviewed in [employer] record.  ROS  The following are not active complaints unless bolded sore throat, dysphagia, dental problems, itching, sneezing,  nasal congestion or excess/ purulent secretions, ear ache,   fever, chills, sweats, unintended wt loss, pleuritic or exertional cp, hemoptysis,  orthopnea pnd or leg swelling, presyncope, palpitations, heartburn,  abdominal pain, anorexia, nausea, vomiting, diarrhea  or change in bowel or urinary habits, change in stools or urine, dysuria,hematuria,  rash, arthralgias, visual complaints, headache, numbness weakness or ataxia or problems with walking or coordination,  change in mood/affect or memory.               Past Medical History:  COPD  - PFT's 12/07/08 FEV1 1.26(61%) ratio 42, 5% resp to B2  - PFT's 02/03/09 FEV1 .88 (38%) ratio 40  - PFT's 02/08/10 FEV1 1.30 (64%) ratio 45, 19% resp to B2 and DLC046% corrects to 63%  - PFT's 04/30/2011        1.28(64%) ratio 46, no better b2  dlco  50%  - HFA technique 75% Mar 04, 2009 > 75% October 05, 2010> 90% November 09, 2010  - 02 dep at hs since April 2010, no desat walking October 05, 2010  - Prednsone maint April 25, 2009  - Add on Qvar 80 December 28, 2009 > d/c November 09, 2010 as not effective  - Dulera 200 started 11/09/10 >> much better December 21, 2010  Hypertension  Hives 07-2007  Allergic rhinitis  - Sinus ct 01/2009 wnl  Goiter 2009 > resected 12/29/08  Health Maintenance......................................[facility] [place]  - Pneumovax September 15, 2009  - 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 11/201/5           Objective:   Physical Exam Ambulatory mod anxious  wf nad min  cushingnoid   wt 144 April 25, 2009 > 11/19/2011 124 > 03/07/2012  131>  07/07/2012  136 > 07/25/2012   134> 08/06/2012 134 08/06/2012 > 138 12/02/2012 > 03/10/2013  138 > 132 06/08/2013 > 08/20/2013 136 > 12/31/2013 136 > 04/19/2014  131 > 07/23/2014 142> 08/20/2014 140 >09/03/14 140 > 11/09/2014  135    HEENT mild turbinate edema.  edentulous with dentures in place/ Oropharynx no thrush or excess pnd or cobblestoning.  No JVD or cervical adenopathy. Mild accessory muscle hypertrophy. Trachea midline, nl thryroid. Chest was  hyperinflated by percussion with diminished breath sounds and moderate increased exp time with mid exp bilateral wheeze. Hoover sign positive at mid inspiration. Regular rate and rhythm without murmur gallop or rub or increase P2 or edema.  Abd: no hsm, nl excursion. Ext warm without cyanosis or clubbing.       CXR  07/23/2014 : No active cardiopulmonary disease. Emphysematous disease.    Assessment:

## 2014-11-09 NOTE — Patient Instructions (Addendum)
symbicort 160 Take 2 puffs first thing in am and then another 2 puffs about 12 hours later.  Follow AM symbicort  With spriva respimatx 2 puffs each am only   Only use your albuterol as a rescue medication to be used if you can't catch your breath by resting or doing a relaxed purse lip breathing pattern.  - The less you use it, the better it will work when you need it. - Ok to use up to 2 puffs  every 4 hours if you must but goal is no more than twice daily and  call for immediate appointment if use goes up over your usual need - Don't leave home without it !!  (think of it like the spare tire for your car)   For nerves/ breathlessness not responding to albuterol  > use diazepam 5 mg one every 6 hours as needed   See calendar for specific medication instructions and bring it back for each and every office visit for every healthcare provider you see.  Without it,  you may not receive the best quality medical care that we feel you deserve.  You will note that the calendar groups together  your maintenance  medications that are timed at particular times of the day.  Think of this as your checklist for what your doctor has instructed you to do until your next evaluation to see what benefit  there is  to staying on a consistent group of medications intended to keep you well.  The other group at the bottom is entirely up to you to use as you see fit  for specific symptoms that may arise between visits that require you to treat them on an as needed basis.  Think of this as your action plan or "what if" list.   Separating the top medications from the bottom group is fundamental to providing you adequate care going forward.    Please schedule a follow up visit in 3 months but call sooner if needed

## 2014-11-10 ENCOUNTER — Encounter: Payer: Self-pay | Admitting: Internal Medicine

## 2014-11-10 NOTE — Assessment & Plan Note (Signed)
Followed in Pulmonary clinic/ [facility]/ Taila Basinski     - 02 at hs only at 1.5.pm [facility]    - ONO ra 07/10/12 > 1 hour and 19 min with sats <88 so rec continue 02    - 08/20/2013  Walked RA x 3 laps @ 185 ft each stopped due to end of study, min sob, sats 88% at en    - 12/31/2013  Walked RA  2 laps @ 185 ft each stopped due to  Sat 88% at end, corrected on 1.5 lpm      - 07/23/2014  Walked RA at fast pace  2 laps @ 185 ft each stopped due to  Sob/ sats 88%      - 11/09/2014   Walked RA x one lap @ 185 stopped due to  Sob, nl pace, desat to 87% corrected on 1.5 lpm   rec as of 11/09/14  = 1.5 lpm at hs and prn during the day unless walking more than100 ft  Pt is continuing to use 02 at home as directed and is benefiting from the use of home oxygen She has a portable system and is using it within her home when doing more than slow walking room to room

## 2014-11-10 NOTE — Assessment & Plan Note (Signed)
-   PFT's 04/30/2011        1.28(64% ratio 46, no better b2  dlco  50%  - PFT's 06/08/2013  FEV1  1.33 (63%) with ratio 49 and no change p saba, DLC0 49 p am spiriva, dulera - HFA technique 75% Mar 04, 2009 > 75% October 05, 2010> 90% November 09, 2010  - 02 dep at hs since April 2010, no desat walking October 05, 2010  - Prednsone maint since  April 25, 2009  - Dulera 200 started 11/09/10 >> much better December 21, 2010  - hfa 90% 08/20/2013  -med calendar 09/17/2013 09/03/14  - added flutter 08/20/14   She has moderate dz but major symptoms/ 02 dep and not better even on prednisone   The proper method of use, as well as anticipated side effects, of a metered-dose inhaler are discussed and demonstrated to the patient. Improved effectiveness after extensive coaching during this visit to a level of approximately  90% with respimat so try this for spiriva trial basis, esp since dry cough also a concern   I had an extended discussion with the patient reviewing all relevant studies completed to date and  lasting 15 to 20 minutes of a 25 minute visit on the following ongoing concerns:     Each maintenance medication was reviewed in detail including most importantly the difference between maintenance and as needed and under what circumstances the prns are to be used. This was done in the context of a medication calendar review which provided the patient with a user-friendly unambiguous mechanism for medication administration and reconciliation and provides an action plan for all active problems. It is critical that this be shown to every doctor  for modification during the office visit if necessary so the patient can use it as a working document.      Part of the action plan calls for her to use valium prn nerves, I added today to use prn dyspnea  I also emphasized symbicort is first thing in am before using any saba

## 2014-12-03 ENCOUNTER — Telehealth: Payer: Self-pay | Admitting: Adult Health

## 2014-12-03 NOTE — Telephone Encounter (Signed)
Work in appt given to pt for 2.23.16 @ 1015 Pt okay with this date and time Pt is aware to call back if symtpoms worsen or seek emergency care Nothing further needed at this time; will sign off

## 2014-12-03 NOTE — Telephone Encounter (Signed)
Called and spoke with pt and she stated that she was seen by MW for her cough and felt like nothing has helped and nothing was done.  She wanted to know if she can see TP.  No TP appts available all next week.  Pt wanted to know if TP has any recs for this. Please advise. Thanks  Allergies  Allergen Reactions  . Tramadol     memory issues    Current Outpatient Prescriptions on File Prior to Visit  Medication Sig Dispense Refill  . acetaminophen (TYLENOL) 500 MG tablet Take 500 mg by mouth every 4 (four) hours as needed for mild pain, fever or headache. Per bottle as needed for pain/headache    . albuterol (PROAIR HFA) 108 (90 BASE) MCG/ACT inhaler Inhale 2 puffs into the lungs every 4 (four) hours as needed for wheezing or shortness of breath.    Marland Kitchen. b complex vitamins tablet Take 1 tablet by mouth daily. 100 tablet 3  . budesonide-formoterol (SYMBICORT) 160-4.5 MCG/ACT inhaler Inhale 2 puffs into the lungs 2 (two) times daily. 1 Inhaler 6  . Calcium Carbonate-Vitamin D (CALCIUM-VITAMIN D) 500-200 MG-UNIT per tablet Take 1 tablet by mouth daily.     . cetirizine (ZYRTEC) 10 MG tablet Take 10 mg by mouth at bedtime as needed for allergies.     . cholecalciferol (VITAMIN D) 1000 UNITS tablet Take 1,000 Units by mouth daily.     Marland Kitchen. dextromethorphan-guaiFENesin (MUCINEX DM) 30-600 MG per 12 hr tablet Take 1-2 tablets by mouth 2 (two) times daily as needed (w/ flutter valve).     . diazepam (VALIUM) 5 MG tablet Take 1 tablet (5 mg total) by mouth every 6 (six) hours as needed for anxiety. 120 tablet 2  . famotidine (PEPCID) 20 MG tablet Take 20 mg by mouth at bedtime.    Marland Kitchen. HYDROcodone-acetaminophen (NORCO/VICODIN) 5-325 MG per tablet Take 1 tablet by mouth every 4 (four) hours as needed for moderate pain (or cough). 40 tablet 0  . NON FORMULARY Oxygen  2 L at bedtime    . omeprazole (PRILOSEC) 40 MG capsule TAKE 1 CAPSULE BY MOUTH 30-60 MINUTES BEFORE FIRST AND LAST MEAL OF THE DAY 60 capsule 5  .  polyethylene glycol (MIRALAX / GLYCOLAX) packet Take 17 g by mouth daily.    . predniSONE (DELTASONE) 5 MG tablet Take 2.5 mg by mouth daily with breakfast.     . PROAIR HFA 108 (90 BASE) MCG/ACT inhaler INHALE 2 PUFFS INTO LUNGS EVERY 4 HOURS AS NEEDED WHEEZING 8.5 each 2  . Respiratory Therapy Supplies (FLUTTER) DEVI Use as directed 1 each 0  . Tiotropium Bromide Monohydrate (SPIRIVA RESPIMAT) 2.5 MCG/ACT AERS 2 each am 1 Inhaler 0  . verapamil (VERELAN PM) 180 MG 24 hr capsule Take 1 capsule (180 mg total) by mouth at bedtime. 30 capsule 5   No current facility-administered medications on file prior to visit.

## 2014-12-07 ENCOUNTER — Ambulatory Visit (INDEPENDENT_AMBULATORY_CARE_PROVIDER_SITE_OTHER)
Admission: RE | Admit: 2014-12-07 | Discharge: 2014-12-07 | Disposition: A | Discharge status: Home / Self Care | Payer: Medicare Other | Source: Ambulatory Visit | Attending: Adult Health | Admitting: Adult Health

## 2014-12-07 ENCOUNTER — Ambulatory Visit (INDEPENDENT_AMBULATORY_CARE_PROVIDER_SITE_OTHER): Payer: Medicare Other | Admitting: Adult Health

## 2014-12-07 ENCOUNTER — Encounter: Payer: Self-pay | Admitting: Adult Health

## 2014-12-07 ENCOUNTER — Telehealth: Payer: Self-pay | Admitting: Adult Health

## 2014-12-07 VITALS — BP 126/74 | HR 92 | Temp 97.6°F | Ht 61.5 in | Wt 130.6 lb

## 2014-12-07 DIAGNOSIS — J449 Chronic obstructive pulmonary disease, unspecified: Secondary | ICD-10-CM

## 2014-12-07 DIAGNOSIS — J9611 Chronic respiratory failure with hypoxia: Secondary | ICD-10-CM

## 2014-12-07 MED ORDER — HYDROCODONE-HOMATROPINE 5-1.5 MG/5ML PO SYRP: 5.0000 mL | ORAL_SOLUTION | Freq: Four times a day (QID) | ORAL | Status: DC | PRN

## 2014-12-07 MED ORDER — AMOXICILLIN-POT CLAVULANATE 875-125 MG PO TABS: 1.0000 | ORAL_TABLET | Freq: Two times a day (BID) | ORAL | Status: AC

## 2014-12-07 MED ORDER — LEVALBUTEROL HCL 0.63 MG/3ML IN NEBU
0.6300 mg | INHALATION_SOLUTION | Freq: Once | RESPIRATORY_TRACT | Status: AC
  Administered 2014-12-07: 0.63 mg via RESPIRATORY_TRACT

## 2014-12-07 MED ORDER — ALBUTEROL SULFATE (2.5 MG/3ML) 0.083% IN NEBU: 2.5000 mg | INHALATION_SOLUTION | RESPIRATORY_TRACT | Status: AC | PRN

## 2014-12-07 MED ORDER — PREDNISONE 10 MG PO TABS: ORAL_TABLET | ORAL | Status: DC

## 2014-12-07 NOTE — Assessment & Plan Note (Signed)
Exacerbation Check chest x-ray today Xopenex nebulizer treatment given in the office  Plan  Augmentin Twice daily  For 7 days with food.  Prednisone taper.  Mucinex DM Twice daily  As needed  Cough/congestion  Hydromet 1 tsp every 8hrs as needed.  Can use Zyrtec 10mg  At bedtime  for drainage As needed   Chest xray today  Follow up in 2 weeks and As needed     Please contact office for sooner follow up if symptoms do not improve or worsen or seek emergency care

## 2014-12-07 NOTE — Progress Notes (Signed)
Subjective:     Patient ID: [name], female   DOB: 13-Nov-1945    MRN: [id number]    Brief patient profile:  [id number] yowf who quit smoking in December 2007, with GOLD II copd copd dx 06/08/13  with minimum asthmatic component  baseline = grocery store ok, tends to avoid mall or superstores and [facility] since July 2010     History of Present Illness  06/08/2013 f/u ov/Wert on pred 5 mg one daily and never need neb, avg saba sev times per day Chief Complaint  Patient presents with  . Followup with cxr and PFT    Pt states having increased SOB and cough since her last visit- relates this to hot, humid weather.  Cough is non prod and worsens when she goes out in the heat.   can still do [employer] s 02 on a good day but worse x sev weeks despite maint rx including pred at 5 mg daily assoc with worsening dry cough- rare saba hfa use  rec Stop losartan as your blood pressure is low > we may need to consider changing to alternative medication due to high heart rate     07/23/2014 f/u ov/Wert re: GOLD II/ steroid dep at 5mg  floor (never tried the 2.5 mg rec at last ov) dulera/spriva Chief Complaint  Patient presents with  . Follow-up    Pt states having increased SOB and cough for the past 3 months. Cough is prod with minimal clear sputum. She states that she is SOB walking from room to room. She is using proair 3-4 times per day.   not using 02 with exertion as rec rec Use 02 with exertion Follow med calendar       08/20/2014 f/u ov/Wert re: GOLD II but steroid / 02 dep  Chief Complaint  Patient presents with  . Acute Visit    cough is worsenening, SOB, DOE, some brownish tint phlegm, tightness comes and goes in chest, rattling sound with breathing   not following action plan per med calendar except related to pred rx at 20 mg daily with floor of 5 mg daily, gradually worse sob and cough since last ov  >>augmentin rx   09/03/14 Follow up  Returns for  follow up and med review  Reports some increased SOB pt attributes to new compression fractures Has seen PCP with back pain , dx w/ compression fx.  Seen last ov with AECOPD , tx w/ augmentin and pred burst , feeling some better.  We reviewed all her medications and asked them into a medication calendar .  rec Finish Augmentin as discussed.  Taper Prednisone per Med Calendar.  Use pain meds cautiously as they cause you to be sleepy.  Follow med calendar closely and bring to each visit.    11/09/2014 f/u ov/Wert re: prednisone 5 mg  One half daily  Chief Complaint  Patient presents with  . Follow-up    Pt states that her SOB to get worse and her cough is no better. She is using proair about 10 x per day.   once gets up at usual hour  Does not use symbicort first thing as rec / no better on higher prednisone so tapered back down. Not using valium at all "I ran out" and def worse sinc no longer taking  >>no changes  12/07/2014 Acute OV : COPD , steroid-dependent/ chronic respiratory failure, oxygen dependent Patient returns for persistent cough, congestion, wheezing and increased shortness  of breath. Patient complains that she's been getting worse over the last 3 months. Complains over the last 5 days. She's had increased cough with clear to yellow and brown mucus, increased wheezing and tightness. She's been taking Mucinex and using her flutter valve without much relief. She did increase her prednisone up to 10 mg without any change in symptoms. She denies any hemoptysis, orthopnea, PND, increased leg swelling, or fever. Appetite is fair with no nausea, vomiting or diarrhea.     Current Medications, Allergies, Complete Past Medical History, Past Surgical History, Family History, and Social History were reviewed in [employer] record.  ROS  The following are not active complaints unless bolded sore throat, dysphagia, dental problems, itching, sneezing,  nasal  congestion or excess/ purulent secretions, ear ache,   fever, chills, sweats, unintended wt loss, pleuritic or exertional cp, hemoptysis,  orthopnea pnd or leg swelling, presyncope, palpitations, heartburn, abdominal pain, anorexia, nausea, vomiting, diarrhea  or change in bowel or urinary habits, change in stools or urine, dysuria,hematuria,  rash, arthralgias, visual complaints, headache, numbness weakness or ataxia or problems with walking or coordination,  change in mood/affect or memory.               Past Medical History:  COPD  - PFT's 12/07/08 FEV1 1.26(61%) ratio 42, 5% resp to B2  - PFT's 02/03/09 FEV1 .88 (38%) ratio 40  - PFT's 02/08/10 FEV1 1.30 (64%) ratio 45, 19% resp to B2 and DLC046% corrects to 63%  - PFT's 04/30/2011        1.28(64%) ratio 46, no better b2  dlco  50%  - HFA technique 75% Mar 04, 2009 > 75% October 05, 2010> 90% November 09, 2010  - 02 dep at hs since April 2010, no desat walking October 05, 2010  - Prednsone maint April 25, 2009  - Add on Qvar 80 December 28, 2009 > d/c November 09, 2010 as not effective  - Dulera 200 started 11/09/10 >> much better December 21, 2010  Hypertension  Hives 07-2007  Allergic rhinitis  - Sinus ct 01/2009 wnl  Goiter 2009 > resected 12/29/08  Health Maintenance......................................[facility] [place]  - Pneumovax September 15, 2009  - 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 11/201/5           Objective:   Physical Exam Ambulatory mod anxious  wf nad min  cushingnoid   wt 144 April 25, 2009 > 11/19/2011 124 > 03/07/2012  131>  07/07/2012  136 > 07/25/2012   134> 08/06/2012 134 08/06/2012 > 138 12/02/2012 > 03/10/2013  138 > 132 06/08/2013 > 08/20/2013 136 > 12/31/2013 136 > 04/19/2014  131 > 07/23/2014 142> 08/20/2014 140 >09/03/14 140 > 11/09/2014  135 >130 12/07/2014    HEENT mild  turbinate edema.  edentulous with dentures in place/ Oropharynx no thrush or excess pnd or cobblestoning.  No JVD or cervical adenopathy. Mild accessory muscle hypertrophy. Trachea midline, nl thryroid. Chest was hyperinflated by percussion with diminished breath sounds and moderate increased exp time with mid exp bilateral wheeze. Hoover sign positive at mid inspiration. Regular rate and rhythm without murmur gallop or rub or increase P2 or edema.  Abd: no hsm, nl excursion. Ext warm without cyanosis or clubbing.       CXR  07/23/2014 : No active cardiopulmonary disease. Emphysematous disease.  Assessment:

## 2014-12-07 NOTE — Addendum Note (Signed)
Addended by: Boone MasterJONES, [name] E on: 12/07/2014 01:24 PM   Modules accepted: Orders

## 2014-12-07 NOTE — Telephone Encounter (Signed)
This is not unusual for Medicare Discussed with TP: recommend Delsym 2tsp q12h prn and Mucinex DM BID prn Pt okay with these recs and reported that she had been using these previously and is okay with continuing them  TP had mentioned after ov to send albuterol neb to pharmacy - $4 list Pt stated that she would like rx sent to [facility] [address] if they will honor the $4 price - advised pt will call and check on this for her. Called [facility] and spoke with pharmacist Revonda StandardAllison - they do not have this medication on the generics list  Called spoke with patient and informed her of the above.  [facility]  Advised pt will send rx  Nothing further needed at this time; will sign off

## 2014-12-07 NOTE — Assessment & Plan Note (Signed)
Reminded to wear oxygen with activity and at bedtime Goal is to keep sats greater than 88%

## 2014-12-07 NOTE — Patient Instructions (Addendum)
Augmentin Twice daily  For 7 days with food.  Prednisone taper.  Mucinex DM Twice daily  As needed  Cough/congestion  Hydromet 1 tsp every 8hrs as needed.  Can use Zyrtec 10mg  At bedtime  for drainage As needed   Chest xray today  Follow up in 2 weeks and As needed     Please contact office for sooner follow up if symptoms do not improve or worsen or seek emergency care

## 2014-12-09 ENCOUNTER — Telehealth: Payer: Self-pay | Admitting: Adult Health

## 2014-12-09 DIAGNOSIS — R911 Solitary pulmonary nodule: Secondary | ICD-10-CM

## 2014-12-09 NOTE — Telephone Encounter (Signed)
Please see CXR , they are in there

## 2014-12-09 NOTE — Telephone Encounter (Signed)
Notes Recorded by Julio Sicksammy S Parrett, NP on 12/09/2014 at 2:37 PM New small nodule noted on left  Will need CT chest -no contrast  Make sure follow up ov with Dr. Sherene SiresWert To discuss --   I spoke with patient about results and she verbalized understanding. CT ordered and pt scheduled to see MW on 12/21/14.

## 2014-12-09 NOTE — Telephone Encounter (Signed)
Spoke with the pt  She is requesting cxr results from 12/07/14  I do not see that there is a result note yet Please advise thanks

## 2014-12-15 ENCOUNTER — Ambulatory Visit (INDEPENDENT_AMBULATORY_CARE_PROVIDER_SITE_OTHER)
Admission: RE | Admit: 2014-12-15 | Discharge: 2014-12-15 | Disposition: A | Discharge status: Home / Self Care | Payer: Medicare Other | Source: Ambulatory Visit | Attending: Internal Medicine | Admitting: Internal Medicine

## 2014-12-15 DIAGNOSIS — R911 Solitary pulmonary nodule: Secondary | ICD-10-CM

## 2014-12-15 NOTE — Progress Notes (Signed)
Quick Note:  Spoke with pt and notified of results per Dr. Wert. Pt verbalized understanding and denied any questions.  ______ 

## 2014-12-21 ENCOUNTER — Ambulatory Visit: Payer: BLUE CROSS/BLUE SHIELD | Admitting: Adult Health

## 2014-12-21 ENCOUNTER — Ambulatory Visit: Payer: BLUE CROSS/BLUE SHIELD | Admitting: Internal Medicine

## 2015-01-05 ENCOUNTER — Other Ambulatory Visit: Payer: Self-pay | Admitting: Internal Medicine

## 2015-01-05 ENCOUNTER — Encounter: Payer: Self-pay | Admitting: Family

## 2015-01-05 ENCOUNTER — Ambulatory Visit (INDEPENDENT_AMBULATORY_CARE_PROVIDER_SITE_OTHER): Payer: Medicare Other | Admitting: Family

## 2015-01-05 VITALS — BP 132/80 | HR 110 | Temp 98.4°F | Resp 20 | Ht 61.5 in | Wt 133.4 lb

## 2015-01-05 DIAGNOSIS — L309 Dermatitis, unspecified: Secondary | ICD-10-CM | POA: Diagnosis not present

## 2015-01-05 MED ORDER — HYDROXYZINE HCL 25 MG PO TABS: 25.0000 mg | ORAL_TABLET | Freq: Three times a day (TID) | ORAL | Status: DC | PRN

## 2015-01-05 MED ORDER — METHYLPREDNISOLONE ACETATE 80 MG/ML IJ SUSP
80.0000 mg | Freq: Once | INTRAMUSCULAR | Status: AC
  Administered 2015-01-05: 80 mg via INTRAMUSCULAR

## 2015-01-05 MED ORDER — TRIAMCINOLONE ACETONIDE 0.5 % EX CREA: 1.0000 "application " | TOPICAL_CREAM | Freq: Three times a day (TID) | CUTANEOUS | Status: DC

## 2015-01-05 NOTE — Progress Notes (Signed)
Subjective:    Patient ID: [name], female    DOB: 1945-11-18, 69 y.o.   MRN: [id number]  Chief Complaint  Patient presents with  . Rash    Has spots on both hips and on her back that itch, not sure if its shingles or eczema, x2 weeks    HPI:  [name] is a 69 y.o. female who presents today for an office visit.  This is a new problem. Associated symptom of spots on her skin located on both hips and her back has been going on for about 2 weeks. Describes as itchy. Has tried [place] Eczema cream which has helped with the itching and the lesions appear to be healing. Notes she has previously been treated for eczema.     Allergies  Allergen Reactions  . Tramadol     memory issues    Current Outpatient Prescriptions on File Prior to Visit  Medication Sig Dispense Refill  . acetaminophen (TYLENOL) 500 MG tablet Take 500 mg by mouth every 4 (four) hours as needed for mild pain, fever or headache. Per bottle as needed for pain/headache    . albuterol (PROVENTIL) (2.5 MG/3ML) 0.083% nebulizer solution Take 3 mLs (2.5 mg total) by nebulization every 4 (four) hours as needed for wheezing or shortness of breath. 25 vial 5  . b complex vitamins tablet Take 1 tablet by mouth daily. 100 tablet 3  . budesonide-formoterol (SYMBICORT) 160-4.5 MCG/ACT inhaler Inhale 2 puffs into the lungs 2 (two) times daily. 1 Inhaler 6  . Calcium Carbonate-Vitamin D (CALCIUM-VITAMIN D) 500-200 MG-UNIT per tablet Take 1 tablet by mouth daily.     . cetirizine (ZYRTEC) 10 MG tablet Take 10 mg by mouth at bedtime as needed for allergies.     . cholecalciferol (VITAMIN D) 1000 UNITS tablet Take 1,000 Units by mouth daily.     Marland Kitchen dextromethorphan-guaiFENesin (MUCINEX DM) 30-600 MG per 12 hr tablet Take 1-2 tablets by mouth 2 (two) times daily as needed (w/ flutter valve).     . diazepam (VALIUM) 5 MG tablet Take 1 tablet (5 mg total) by mouth every 6 (six) hours as needed for anxiety. 120 tablet 2  .  famotidine (PEPCID) 20 MG tablet Take 20 mg by mouth at bedtime.    . NON FORMULARY Oxygen  2 L at bedtime    . omeprazole (PRILOSEC) 40 MG capsule TAKE 1 CAPSULE BY MOUTH 30-60 MINUTES BEFORE FIRST AND LAST MEAL OF THE DAY 60 capsule 5  . polyethylene glycol (MIRALAX / GLYCOLAX) packet Take 17 g by mouth daily.    . predniSONE (DELTASONE) 10 MG tablet 4 tabs for 2 days, then 3 tabs for 2 days, 2 tabs for 2 days, then 1 tab for 2 days, then back to  daily 20 tablet 0  . PROAIR HFA 108 (90 BASE) MCG/ACT inhaler INHALE 2 PUFFS INTO LUNGS EVERY 4 HOURS AS NEEDED WHEEZING 8.5 each 2  . Respiratory Therapy Supplies (FLUTTER) DEVI Use as directed 1 each 0  . Tiotropium Bromide Monohydrate (SPIRIVA RESPIMAT) 2.5 MCG/ACT AERS 2 each am 1 Inhaler 0  . verapamil (VERELAN PM) 180 MG 24 hr capsule Take 1 capsule (180 mg total) by mouth at bedtime. 30 capsule 5   No current facility-administered medications on file prior to visit.    Review of Systems  Constitutional: Negative for fever and chills.  Skin: Positive for rash.      Objective:    BP 132/80 mmHg  Pulse  110  Temp(Src) 98.4 F (36.9 C) (Oral)  Resp 20  Ht 5' 1.5" (1.562 m)  Wt 133 lb 6.4 oz (60.51 kg)  BMI 24.80 kg/m2  SpO2 90% Nursing note and vital signs reviewed.  Physical Exam  Constitutional: She is oriented to person, place, and time. She appears well-developed and well-nourished. No distress.  Cardiovascular: Normal rate, regular rhythm, normal heart sounds and intact distal pulses.   Pulmonary/Chest: Effort normal and breath sounds normal.  Neurological: She is alert and oriented to person, place, and time.  Skin: Skin is warm and dry.  Scattered reddened areas in various stages of healing, appears scaly, located across her back and on the lateral side of her hips.   Psychiatric: She has a normal mood and affect. Her behavior is normal. Judgment and thought content normal.       Assessment & Plan:

## 2015-01-05 NOTE — Assessment & Plan Note (Signed)
Symptoms and exam consistent with eczema. In office injection of Depo-Medrol given. Start triamcinolone cream. Start hydroxyzine as needed for itching. Discussed using moisturizing creams to help prevent future breakouts. Follow-up if symptoms worsen or fail to improve.

## 2015-01-05 NOTE — Progress Notes (Signed)
Pre visit review using our clinic review tool, if applicable. No additional management support is needed unless otherwise documented below in the visit note. 

## 2015-01-05 NOTE — Patient Instructions (Signed)
Thank you for choosing [facility].  Summary/Instructions:  Please use Aveeno, Dove or Eucerin moisturizing products.  Please use hydroxyzine for itching    Your prescription(s) have been submitted to your pharmacy or been printed and provided for you. Please take as directed and contact our office if you believe you are having problem(s) with the medication(s) or have any questions.  If your symptoms worsen or fail to improve, please contact our office for further instruction, or in case of emergency go directly to the emergency room at the closest medical facility.   Eczema Eczema, also called atopic dermatitis, is a skin disorder that causes inflammation of the skin. It causes a red rash and dry, scaly skin. The skin becomes very itchy. Eczema is generally worse during the cooler winter months and often improves with the warmth of summer. Eczema usually starts showing signs in infancy. Some children outgrow eczema, but it may last through adulthood.  CAUSES  The exact cause of eczema is not known, but it appears to run in families. People with eczema often have a family history of eczema, allergies, asthma, or hay fever. Eczema is not contagious. Flare-ups of the condition may be caused by:   Contact with something you are sensitive or allergic to.   Stress. SIGNS AND SYMPTOMS  Dry, scaly skin.   Red, itchy rash.   Itchiness. This may occur before the skin rash and may be very intense.  DIAGNOSIS  The diagnosis of eczema is usually made based on symptoms and medical history. TREATMENT  Eczema cannot be cured, but symptoms usually can be controlled with treatment and other strategies. A treatment plan might include:  Controlling the itching and scratching.   Use over-the-counter antihistamines as directed for itching. This is especially useful at night when the itching tends to be worse.   Use over-the-counter steroid creams as directed for itching.   Avoid  scratching. Scratching makes the rash and itching worse. It may also result in a skin infection (impetigo) due to a break in the skin caused by scratching.   Keeping the skin well moisturized with creams every day. This will seal in moisture and help prevent dryness. Lotions that contain alcohol and water should be avoided because they can dry the skin.   Limiting exposure to things that you are sensitive or allergic to (allergens).   Recognizing situations that cause stress.   Developing a plan to manage stress.  HOME CARE INSTRUCTIONS   Only take over-the-counter or prescription medicines as directed by your health care provider.   Do not use anything on the skin without checking with your health care provider.   Keep baths or showers short (5 minutes) in warm (not hot) water. Use mild cleansers for bathing. These should be unscented. You may add nonperfumed bath oil to the bath water. It is best to avoid soap and bubble bath.   Immediately after a bath or shower, when the skin is still damp, apply a moisturizing ointment to the entire body. This ointment should be a petroleum ointment. This will seal in moisture and help prevent dryness. The thicker the ointment, the better. These should be unscented.   Keep fingernails cut short. Children with eczema may need to wear soft gloves or mittens at night after applying an ointment.   Dress in clothes made of cotton or cotton blends. Dress lightly, because heat increases itching.   A child with eczema should stay away from anyone with fever blisters or cold sores.  The virus that causes fever blisters (herpes simplex) can cause a serious skin infection in children with eczema. SEEK MEDICAL CARE IF:   Your itching interferes with sleep.   Your rash gets worse or is not better within 1 week after starting treatment.   You see pus or soft yellow scabs in the rash area.   You have a fever.   You have a rash flare-up after  contact with someone who has fever blisters.  Document Released: 09/28/2000 Document Revised: 07/22/2013 Document Reviewed: 05/04/2013 [facility] Patient Information 2015 [place], [place]. This information is not intended to replace advice given to you by your health care provider. Make sure you discuss any questions you have with your health care provider.

## 2015-02-07 ENCOUNTER — Ambulatory Visit (INDEPENDENT_AMBULATORY_CARE_PROVIDER_SITE_OTHER): Payer: Medicare Other | Admitting: Internal Medicine

## 2015-02-07 ENCOUNTER — Encounter: Payer: Self-pay | Admitting: Internal Medicine

## 2015-02-07 VITALS — BP 132/90 | HR 102 | Ht 61.5 in | Wt 135.4 lb

## 2015-02-07 DIAGNOSIS — J449 Chronic obstructive pulmonary disease, unspecified: Secondary | ICD-10-CM | POA: Diagnosis not present

## 2015-02-07 NOTE — Progress Notes (Signed)
Subjective:     Patient ID: [name], female   DOB: 13-Nov-1945    MRN: [id number]    Brief patient profile:  [id number] yowf who quit smoking in December 2007, with GOLD II copd copd dx 06/08/13  with minimum asthmatic component  baseline = grocery store ok, tends to avoid mall or superstores and [facility] since July [facility]     History of Present Illness  06/08/2013 f/u ov/Sherilynn Dieu on pred 5 mg one daily and never need neb, avg saba sev times per day Chief Complaint  Patient presents with  . Followup with cxr and PFT    Pt states having increased SOB and cough since her last visit- relates this to hot, humid weather.  Cough is non prod and worsens when she goes out in the heat.   can still do [employer] s 02 on a good day but worse x sev weeks despite maint rx including pred at 5 mg daily assoc with worsening dry cough- rare saba hfa use  rec Stop losartan as your blood pressure is low > we may need to consider changing to alternative medication due to high heart rate     07/23/2014 f/u ov/Aseem Sessums re: GOLD II/ steroid dep at 5mg  floor (never tried the 2.5 mg rec at last ov) dulera/spriva Chief Complaint  Patient presents with  . Follow-up    Pt states having increased SOB and cough for the past 3 months. Cough is prod with minimal clear sputum. She states that she is SOB walking from room to room. She is using proair 3-4 times per day.   not using 02 with exertion as rec rec Use 02 with exertion Follow med calendar       08/20/2014 f/u ov/Benjy Kana re: GOLD II but steroid / 02 dep  Chief Complaint  Patient presents with  . Acute Visit    cough is worsenening, SOB, DOE, some brownish tint phlegm, tightness comes and goes in chest, rattling sound with breathing   not following action plan per med calendar except related to pred rx at 20 mg daily with floor of 5 mg daily, gradually worse sob and cough since last ov  >>augmentin rx   09/03/14 Follow up  Returns for  follow up and med review  Reports some increased SOB pt attributes to new compression fractures Has seen PCP with back pain , dx w/ compression fx.  Seen last ov with AECOPD , tx w/ augmentin and pred burst , feeling some better.  We reviewed all her medications and asked them into a medication calendar .  rec Finish Augmentin as discussed.  Taper Prednisone per Med Calendar.  Use pain meds cautiously as they cause you to be sleepy.  Follow med calendar closely and bring to each visit.    11/09/2014 f/u ov/Chistian Kasler re: prednisone 5 mg  One half daily  Chief Complaint  Patient presents with  . Follow-up    Pt states that her SOB to get worse and her cough is no better. She is using proair about 10 x per day.   once gets up at usual hour  Does not use symbicort first thing as rec / no better on higher prednisone so tapered back down. Not using valium at all "I ran out" and def worse sinc no longer taking  >>no changes  12/07/2014 Acute OV : COPD , steroid-dependent/ chronic respiratory failure, oxygen dependent Patient returns for persistent cough, congestion, wheezing and increased shortness  of breath. Patient complains that she's been getting worse over the last 3 months. Complains over the last 5 days. She's had increased cough with clear to yellow and brown mucus, increased wheezing and tightness. She's been taking Mucinex and using her flutter valve without much relief. She did increase her prednisone up to 10 mg without any change in symptoms. rec Augmentin Twice daily  For 7 days with food.  Prednisone taper.  Mucinex DM Twice daily  As needed  Cough/congestion  Hydromet 1 tsp every 8hrs as needed.  Can use Zyrtec 10mg  At bedtime  for drainage As needed      02/07/2015 f/u ov/Rasheen Schewe re: GOLD II/ steroid dep at 2.5 floor / using med calendar well  Chief Complaint  Patient presents with  . Followup    Pt states her breathing is doing well- best in the past 6-7 months. She does have  some minimal cough- non prod- relates to pollen.    Only using saba twice a week at most in hfa/ never uses saba neb though does have it   Able to make bed, do housework include vaccuming, down to 2.5 mg daily prednisone   No obvious day to day or daytime variabilty or assoc chronic cough or cp or chest tightness, subjective wheeze overt sinus or hb symptoms. No unusual exp hx or h/o childhood pna/ asthma or knowledge of premature birth.  Sleeping ok without nocturnal  or early am exacerbation  of respiratory  c/o's or need for noct saba. Also denies any obvious fluctuation of symptoms with weather or environmental changes or other aggravating or alleviating factors except as outlined above   Current Medications, Allergies, Complete Past Medical History, Past Surgical History, Family History, and Social History were reviewed in [facility] electronic [facility] record.  ROS  The following are not active complaints unless bolded sore throat, dysphagia, dental problems, itching, sneezing,  nasal congestion or excess/ purulent secretions, ear ache,   fever, chills, sweats, unintended wt loss, pleuritic or exertional cp, hemoptysis,  orthopnea pnd or leg swelling, presyncope, palpitations, heartburn, abdominal pain, anorexia, nausea, vomiting, diarrhea  or change in bowel or urinary habits, change in stools or urine, dysuria,hematuria,  rash, arthralgias, visual complaints, headache, numbness weakness or ataxia or problems with walking or coordination,  change in mood/affect or memory.                       Past Medical History:  COPD  - PFT's 12/07/08 FEV1 1.26(61%) ratio 42, 5% resp to B2  - PFT's 02/03/09 FEV1 .88 (38%) ratio 40  - PFT's 02/08/10 FEV1 1.30 (64%) ratio 45, 19% resp to B2 and DLC046% corrects to 63%  - PFT's 04/30/2011        1.28(64%) ratio 46, no better b2  dlco  50%  - HFA technique 75% Mar 04, 2009 > 75% October 05, 2010> 90% November 09, 2010  - 02 dep at hs since  April 2010, no desat walking October 05, 2010  - Prednsone maint April 25, 2009  - Add on Qvar 80 December 28, 2009 > d/c November 09, 2010 as not effective  - Dulera 200 started 11/09/10 >> much better December 21, 2010  Hypertension  Hives 07-2007  Allergic rhinitis  - Sinus ct 01/2009 wnl  Goiter 2009 > resected 12/29/08  Health Maintenance......................................Marland Kitchen[facility]  - Pneumovax September 15, 2009  - 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, L Fem -1.9, 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 11/201/5           Objective:   Physical Exam Ambulatory mod anxious  wf nad min  cushingnoid   wt 144 April 25, 2009 > 11/19/2011 124 > 03/07/2012  131>  07/07/2012  136 > 07/25/2012   134> 08/06/2012 134 08/06/2012 > 138 12/02/2012 > 03/10/2013  138 > 132 06/08/2013 > 08/20/2013 136 > 12/31/2013 136 > 04/19/2014  131 > 07/23/2014 142> 08/20/2014 140 >09/03/14 140 > 11/09/2014  135 >130 12/07/2014 > 02/07/2015 135    HEENT mild turbinate edema.  edentulous with dentures in place/ Oropharynx no thrush or excess pnd or cobblestoning.  No JVD or cervical adenopathy. Mild accessory muscle hypertrophy. Trachea midline, nl thryroid. Chest was hyperinflated by percussion with diminished breath sounds and moderate increased exp time with mid exp bilateral wheeze. Hoover sign positive at mid inspiration. Regular rate and rhythm without murmur gallop or rub or increase P2 or edema.  Abd: no hsm, nl excursion. Ext warm without cyanosis or clubbing.       CXR  07/23/2014 : No active cardiopulmonary disease. Emphysematous disease.    Assessment:

## 2015-02-07 NOTE — Patient Instructions (Signed)
See calendar for specific medication instructions and bring it back for each and every office visit for every healthcare provider you see.  Without it,  you may not receive the best quality medical care that we feel you deserve.  You will note that the calendar groups together  your maintenance  medications that are timed at particular times of the day.  Think of this as your checklist for what your doctor has instructed you to do until your next evaluation to see what benefit  there is  to staying on a consistent group of medications intended to keep you well.  The other group at the bottom is entirely up to you to use as you see fit  for specific symptoms that may arise between visits that require you to treat them on an as needed basis.  Think of this as your action plan or "what if" list.   Separating the top medications from the bottom group is fundamental to providing you adequate care going forward.    Please schedule a follow up visit in 3 months but call sooner if needed Tammy

## 2015-02-08 ENCOUNTER — Encounter: Payer: Self-pay | Admitting: Internal Medicine

## 2015-02-08 NOTE — Assessment & Plan Note (Addendum)
-   PFT's 04/30/2011        1.28(64% ratio 46, no better b2  dlco  50%  - PFT's 06/08/2013  FEV1  1.33 (63%) with ratio 49 and no change p saba, DLC0 49 p am spiriva, dulera - HFA technique 75% Mar 04, 2009 > 75% October 05, 2010> 90% November 09, 2010  - 02 dep at hs since April 2010, no desat walking October 05, 2010  - Prednsone maint since  April 25, 2009  - hfa 90% 08/20/2013  -med calendar 09/17/2013 09/03/14  - added flutter 08/20/14   Has finally turned the corner in outpt management and now the goal is maintaining present level of improvement without exacerbations   I had an extended discussion with the patient reviewing all relevant studies completed to date and  lasting 15 to 20 minutes of a 25 minute visit on the following ongoing concerns:   1) Each maintenance medication was reviewed in detail including most importantly the difference between maintenance and as needed and under what circumstances the prns are to be used. This was done in the context of a medication calendar review which provided the patient with a user-friendly unambiguous mechanism for medication administration and reconciliation and provides an action plan for all active problems. It is critical that this be shown to every doctor  for modification during the office visit if necessary so the patient can use it as a working document.      2) The goal with a chronic steroid dependent illness is always arriving at the lowest effective dose that controls the disease/symptoms and not accepting a set "formula" which is based on statistics or guidelines that don't always take into account patient  variability or the natural hx of the dz in every individual patient, which may well vary over time.  For now therefore I recommend the patient maintain  A floor of 2.5 mg daily with action plan to increase to 20 mg daily if flares  3) See instructions for specific recommendations which were reviewed directly with the patient who was  given a copy with highlighter outlining the key components.

## 2015-02-21 ENCOUNTER — Other Ambulatory Visit: Payer: Self-pay | Admitting: Internal Medicine

## 2015-03-11 ENCOUNTER — Encounter: Payer: Self-pay | Admitting: Internal Medicine

## 2015-03-11 ENCOUNTER — Ambulatory Visit (INDEPENDENT_AMBULATORY_CARE_PROVIDER_SITE_OTHER): Payer: Medicare Other | Admitting: Internal Medicine

## 2015-03-11 ENCOUNTER — Other Ambulatory Visit (INDEPENDENT_AMBULATORY_CARE_PROVIDER_SITE_OTHER): Payer: Medicare Other

## 2015-03-11 VITALS — BP 130/80 | HR 80 | Temp 97.6°F | Ht 61.5 in | Wt 134.0 lb

## 2015-03-11 DIAGNOSIS — F1027 Alcohol dependence with alcohol-induced persisting dementia: Secondary | ICD-10-CM

## 2015-03-11 DIAGNOSIS — M810 Age-related osteoporosis without current pathological fracture: Secondary | ICD-10-CM | POA: Insufficient documentation

## 2015-03-11 DIAGNOSIS — Z23 Encounter for immunization: Secondary | ICD-10-CM | POA: Diagnosis not present

## 2015-03-11 DIAGNOSIS — I10 Essential (primary) hypertension: Secondary | ICD-10-CM | POA: Diagnosis not present

## 2015-03-11 LAB — BASIC METABOLIC PANEL
BUN: 12 mg/dL (ref 6–23)
CHLORIDE: 104 meq/L (ref 96–112)
CO2: 26 mEq/L (ref 19–32)
CREATININE: 1.08 mg/dL (ref 0.40–1.20)
Calcium: 9.5 mg/dL (ref 8.4–10.5)
GFR: 53.68 mL/min — AB (ref >60.00)
Glucose, Bld: 85 mg/dL (ref 70–99)
Potassium: 3.6 mEq/L (ref 3.5–5.1)
Sodium: 139 mEq/L (ref 135–145)

## 2015-03-11 LAB — VITAMIN D 25 HYDROXY (VIT D DEFICIENCY, FRACTURES): VITD: 45.69 ng/mL (ref 30.00–100.00)

## 2015-03-11 MED ORDER — IBANDRONATE SODIUM 150 MG PO TABS: 150.0000 mg | ORAL_TABLET | ORAL | Status: DC

## 2015-03-11 MED ORDER — PREDNISOLONE 5 MG PO TABS: 5.0000 mg | ORAL_TABLET | Freq: Every day | ORAL | Status: DC

## 2015-03-11 NOTE — Progress Notes (Signed)
Pre visit review using our clinic review tool, if applicable. No additional management support is needed unless otherwise documented below in the visit note. 

## 2015-03-11 NOTE — Progress Notes (Signed)
   Subjective:     HPI  F/u alcoholic encephalopathy - resolved. No ETOH. Doing much better. Not working now - retired.  F/u for H zoster L face/L eye -- 01/13/14.   No blurred vision. She did see an eye doctor  F/u rapid heart beat - Dr Sherene SiresWert stopped Losartan HCT. SBP 140-150 HR 90-100 The spasms in the back have resolved  The patient presents for a follow-up of  chronic hypertension elev BP at home  F/u on tremor - better, COPD, fatigue. C/o weakness, SOB. Working 4 d/wk - 20 hrs a week     BP Readings from Last 3 Encounters:  03/11/15 130/80  02/07/15 132/90  01/05/15 132/80   Wt Readings from Last 3 Encounters:  03/11/15 134 lb (60.782 kg)  02/07/15 135 lb 6.4 oz (61.417 kg)  01/05/15 133 lb 6.4 oz (60.51 kg)                Review of Systems  Constitutional: Positive for fatigue. Negative for activity change, appetite change and unexpected weight change.  HENT: Negative for congestion, mouth sores and sinus pressure.   Eyes: Negative for visual disturbance.  Respiratory: Negative for chest tightness.   Gastrointestinal: Negative for nausea and abdominal pain.  Genitourinary: Negative for frequency, difficulty urinating and vaginal pain.  Musculoskeletal: Negative for back pain and gait problem.  Skin: Negative for pallor.  Neurological: Positive for tremors. Negative for dizziness, weakness and numbness.  Psychiatric/Behavioral: Negative for confusion and disturbed wake/sleep cycle. The patient is nervous/anxious.        Objective:   Physical Exam  Constitutional: She appears well-developed. No distress.  HENT: WNL Head: Normocephalic.  Right Ear: External ear normal.  Left Ear: External ear normal.  Nose: Nose normal.  Mouth/Throat: Oropharynx is clear and moist.  Eyes: Conjunctivae are normal. Pupils are equal, round, and reactive to light. Neck: Normal range of motion. Neck supple. No JVD present. No tracheal deviation present. No thyromegaly  present.  Cardiovascular: Normal rate, regular rhythm and normal heart sounds.   Pulmonary/Chest: Effort normal. No stridor. No respiratory distress. She has no wheezes. She has no rales. She exhibits no tenderness.  Abdominal: Soft. Bowel sounds are normal. She exhibits no distension and no mass. There is no tenderness. There is no rebound and no guarding.  Musculoskeletal: She exhibits no edema and no tenderness.  Lymphadenopathy:    She has no cervical adenopathy.  Neurological: She displays normal reflexes. No cranial nerve deficit. She exhibits normal muscle tone. Coordination normal.   Skin: clear Psychiatric: She has a normal mood and affect. Her behavior is normal. Judgment and thought content normal.   Tremor is much better  Lab Results   Lab Results  Component Value Date   WBC 12.3* 09/30/2014   HGB 13.8 09/30/2014   HCT 40.3 09/30/2014   PLT 344 09/30/2014   GLUCOSE 102* 09/30/2014   CHOL 226* 05/16/2010   TRIG 286.0* 05/16/2010   HDL 73.10 05/16/2010   LDLDIRECT 108.0 05/16/2010   ALT 14 02/17/2014   AST 20 02/17/2014   NA 141 09/30/2014   K 3.7 09/30/2014   CL 102 09/30/2014   CREATININE 0.93 09/30/2014   BUN 15 09/30/2014   CO2 22 09/30/2014   TSH 0.42 10/01/2011   INR 0.98 09/30/2014   HGBA1C 5.4 01/05/2013         Assessment & Plan:

## 2015-03-11 NOTE — Assessment & Plan Note (Signed)
compr fx On Prednisone prn 5/16 start Boniva. Cont Vit D

## 2015-03-15 NOTE — Assessment & Plan Note (Signed)
Better  

## 2015-03-15 NOTE — Assessment & Plan Note (Signed)
Verapamil 

## 2015-04-05 ENCOUNTER — Encounter: Payer: Self-pay | Admitting: *Deleted

## 2015-05-09 ENCOUNTER — Ambulatory Visit (INDEPENDENT_AMBULATORY_CARE_PROVIDER_SITE_OTHER): Payer: Medicare Other | Admitting: Adult Health

## 2015-05-09 ENCOUNTER — Encounter: Payer: Self-pay | Admitting: Adult Health

## 2015-05-09 VITALS — BP 122/84 | HR 80 | Temp 98.3°F | Ht 61.0 in | Wt 134.0 lb

## 2015-05-09 DIAGNOSIS — B370 Candidal stomatitis: Secondary | ICD-10-CM | POA: Diagnosis not present

## 2015-05-09 DIAGNOSIS — J9611 Chronic respiratory failure with hypoxia: Secondary | ICD-10-CM | POA: Diagnosis not present

## 2015-05-09 DIAGNOSIS — J449 Chronic obstructive pulmonary disease, unspecified: Secondary | ICD-10-CM | POA: Diagnosis not present

## 2015-05-09 MED ORDER — CLOTRIMAZOLE 10 MG MT TROC: 10.0000 mg | Freq: Every day | OROMUCOSAL | Status: DC

## 2015-05-09 NOTE — Assessment & Plan Note (Signed)
Doing well on current regimen   Plan  Follow med calendar closely and bring to each visit.  Follow up with Dr. Sherene Sires  In 3  months and As needed   Mycelex troches five times daily for 1 week .  Rinse and gargle after use.  Please contact office for sooner follow up if symptoms do not improve or worsen or seek emergency care

## 2015-05-09 NOTE — Assessment & Plan Note (Signed)
Cont on current regimen  

## 2015-05-09 NOTE — Progress Notes (Signed)
Subjective:     Patient ID: [name], female   DOB: 13-Nov-1945    MRN: [id number]    Brief patient profile:  [age] yowf who quit smoking in December 2007, with GOLD II copd copd dx 06/08/13  with minimum asthmatic component  baseline = grocery store ok, tends to avoid mall or superstores and [facility] since July [facility]     History of Present Illness  06/08/2013 f/u ov/Wert on pred 5 mg one daily and never need neb, avg saba sev times per day Chief Complaint  Patient presents with  . Followup with cxr and PFT    Pt states having increased SOB and cough since her last visit- relates this to hot, humid weather.  Cough is non prod and worsens when she goes out in the heat.   can still do [employer] s 02 on a good day but worse x sev weeks despite maint rx including pred at 5 mg daily assoc with worsening dry cough- rare saba hfa use  rec Stop losartan as your blood pressure is low > we may need to consider changing to alternative medication due to high heart rate     07/23/2014 f/u ov/Wert re: GOLD II/ steroid dep at 5mg  floor (never tried the 2.5 mg rec at last ov) dulera/spriva Chief Complaint  Patient presents with  . Follow-up    Pt states having increased SOB and cough for the past 3 months. Cough is prod with minimal clear sputum. She states that she is SOB walking from room to room. She is using proair 3-4 times per day.   not using 02 with exertion as rec rec Use 02 with exertion Follow med calendar       08/20/2014 f/u ov/Wert re: GOLD II but steroid / 02 dep  Chief Complaint  Patient presents with  . Acute Visit    cough is worsenening, SOB, DOE, some brownish tint phlegm, tightness comes and goes in chest, rattling sound with breathing   not following action plan per med calendar except related to pred rx at 20 mg daily with floor of 5 mg daily, gradually worse sob and cough since last ov  >>augmentin rx   09/03/14 Follow up  Returns for  follow up and med review  Reports some increased SOB pt attributes to new compression fractures Has seen PCP with back pain , dx w/ compression fx.  Seen last ov with AECOPD , tx w/ augmentin and pred burst , feeling some better.  We reviewed all her medications and asked them into a medication calendar .  rec Finish Augmentin as discussed.  Taper Prednisone per Med Calendar.  Use pain meds cautiously as they cause you to be sleepy.  Follow med calendar closely and bring to each visit.    11/09/2014 f/u ov/Wert re: prednisone 5 mg  One half daily  Chief Complaint  Patient presents with  . Follow-up    Pt states that her SOB to get worse and her cough is no better. She is using proair about 10 x per day.   once gets up at usual hour  Does not use symbicort first thing as rec / no better on higher prednisone so tapered back down. Not using valium at all "I ran out" and def worse sinc no longer taking  >>no changes  12/07/2014 Acute OV : COPD , steroid-dependent/ chronic respiratory failure, oxygen dependent Patient returns for persistent cough, congestion, wheezing and increased shortness  of breath. Patient complains that she's been getting worse over the last 3 months. Complains over the last 5 days. She's had increased cough with clear to yellow and brown mucus, increased wheezing and tightness. She's been taking Mucinex and using her flutter valve without much relief. She did increase her prednisone up to 10 mg without any change in symptoms. rec Augmentin Twice daily  For 7 days with food.  Prednisone taper.  Mucinex DM Twice daily  As needed  Cough/congestion  Hydromet 1 tsp every 8hrs as needed.  Can use Zyrtec 10mg  At bedtime  for drainage As needed      02/07/2015 f/u ov/Wert re: GOLD II/ steroid dep at 2.5 floor / using med calendar well  Chief Complaint  Patient presents with  . Followup    Pt states her breathing is doing well- best in the past 6-7 months. She does have  some minimal cough- non prod- relates to pollen.    Only using saba twice a week at most in hfa/ never uses saba neb though does have it   Able to make bed, do housework include vaccuming, down to 2.5 mg daily prednisone >>no changes   05/09/2015 Follow up : COPD II/Steroid dependen/ Med review /[place]rett NP ov  Pt returns for 3 month follow up and med review  We reviewed all her meds and organized them into a med calendar w/ pt education  Appears to be taking correctly.  Denies any chest pain, orthopnea, edema , fever .  Says overall breathing is doing ok.  Hot temps are harder on her.    Current Medications, Allergies, Complete Past Medical History, Past Surgical History, Family History, and Social History were reviewed in [employer] record.  ROS  The following are not active complaints unless bolded sore throat, dysphagia, dental problems, itching, sneezing,  nasal congestion or excess/ purulent secretions, ear ache,   fever, chills, sweats, unintended wt loss, pleuritic or exertional cp, hemoptysis,  orthopnea pnd or leg swelling, presyncope, palpitations, heartburn, abdominal pain, anorexia, nausea, vomiting, diarrhea  or change in bowel or urinary habits, change in stools or urine, dysuria,hematuria,  rash, arthralgias, visual complaints, headache, numbness weakness or ataxia or problems with walking or coordination,  change in mood/affect or memory.        Past Medical History:  COPD  - PFT's 12/07/08 FEV1 1.26(61%) ratio 42, 5% resp to B2  - PFT's 02/03/09 FEV1 .88 (38%) ratio 40  - PFT's 02/08/10 FEV1 1.30 (64%) ratio 45, 19% resp to B2 and DLC046% corrects to 63%  - PFT's 04/30/2011        1.28(64%) ratio 46, no better b2  dlco  50%  - HFA technique 75% Mar 04, 2009 > 75% October 05, 2010> 90% November 09, 2010  - 02 dep at hs since April 2010, no desat walking October 05, 2010  - Prednsone maint April 25, 2009  - Add on Qvar 80 December 28, 2009 > d/c November 09, 2010 as not effective  - Dulera 200 started 11/09/10 >> much better December 21, 2010  Hypertension  Hives 07-2007  Allergic rhinitis  - Sinus ct 01/2009 wnl  Goiter 2009 > resected 12/29/08  Health Maintenance......................................[facility]  - Pneumovax September 15, 2009  - 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 11/201/5  Objective:   Physical Exam Ambulatory mod anxious  wf nad min  cushingnoid   wt 144 April 25, 2009 > 11/19/2011 124 > 03/07/2012  131>  07/07/2012  136 > 07/25/2012   134> 08/06/2012 134 08/06/2012 > 138 12/02/2012 > 03/10/2013  138 > 132 06/08/2013 > 08/20/2013 136 > 12/31/2013 136 > 04/19/2014  131 > 07/23/2014 142> 08/20/2014 140 >09/03/14 140 > 11/09/2014  135 >130 12/07/2014 > 02/07/2015 135 >134 05/09/2015    HEENT mild turbinate edema.  edentulous with dentures in place/ Oropharynx + thrush  no excess pnd or cobblestoning.  No JVD or cervical adenopathy. Mild accessory muscle hypertrophy. Trachea midline, nl thryroid. Chest was hyperinflated by percussion with diminished breath sounds and moderate increased exp time . Hoover sign positive at mid inspiration. Regular rate and rhythm without murmur gallop or rub or increase P2 or edema.  Abd: no hsm, nl excursion. Ext warm without cyanosis or clubbing.       CXR  07/23/2014 : No active cardiopulmonary disease. Emphysematous disease.  CT chest 12/2014  The density on chest radiography is cause by callus formation associated with a healing left anterior fifth rib fracture. There is no underlying worrisome pulmonary nodule. 2. The remotely (in 2014) worked up pulmonary nodule in the left lower lobe laterally is reduced in conspicuity on today's exam, and likely postinflammatory. 3. Severe emphysema. 4. Atherosclerosis. 5. Complex left kidney upper  pole cyst, stable. 6. Prior compression fractures at T12 and T9 with vertebral augmentation; subtle superior endplate compressions at T10 and T11. Findings favor osteoporosis.  Assessment:

## 2015-05-09 NOTE — Assessment & Plan Note (Signed)
Oral candidiasis , educated on inhaler /oral care  Plan  Mycelex troches five times daily for 1 week .  Rinse and gargle after use.  Please contact office for sooner follow up if symptoms do not improve or worsen or seek emergency care

## 2015-05-09 NOTE — Progress Notes (Signed)
Chart and office note reviewed in detail  > agree with a/p as outlined    

## 2015-05-09 NOTE — Patient Instructions (Addendum)
Follow med calendar closely and bring to each visit.  Follow up with Dr. Sherene Sires  In 3  months and As needed   Mycelex troches five times daily for 1 week .  Rinse and gargle after use.  Please contact office for sooner follow up if symptoms do not improve or worsen or seek emergency care

## 2015-05-12 ENCOUNTER — Other Ambulatory Visit: Payer: Self-pay | Admitting: Internal Medicine

## 2015-05-16 NOTE — Addendum Note (Signed)
Addended by: Karalee Height on: 05/16/2015 02:15 PM   Modules accepted: Orders, Medications

## 2015-05-21 ENCOUNTER — Other Ambulatory Visit: Payer: Self-pay | Admitting: Internal Medicine

## 2015-05-24 ENCOUNTER — Telehealth: Payer: Self-pay | Admitting: Adult Health

## 2015-05-24 NOTE — Telephone Encounter (Signed)
Pt aware that we do not have any samples of Symbicort or Spiriva at this time.  Pt advised that she can check back later this week.  Nothing further needed.

## 2015-05-26 ENCOUNTER — Telehealth: Payer: Self-pay | Admitting: Adult Health

## 2015-05-26 NOTE — Telephone Encounter (Signed)
Pt spouse aware we currently do not have any samples. Nothing further needed

## 2015-05-28 ENCOUNTER — Other Ambulatory Visit: Payer: Self-pay | Admitting: Internal Medicine

## 2015-06-04 ENCOUNTER — Other Ambulatory Visit: Payer: Self-pay | Admitting: Internal Medicine

## 2015-06-08 ENCOUNTER — Other Ambulatory Visit: Payer: Self-pay | Admitting: Internal Medicine

## 2015-06-09 ENCOUNTER — Telehealth: Payer: Self-pay | Admitting: Adult Health

## 2015-06-09 NOTE — Telephone Encounter (Signed)
I called spoke with pt. Aware we don't have any samples at this time. Nothing further needed

## 2015-06-13 ENCOUNTER — Other Ambulatory Visit: Payer: Self-pay | Admitting: Internal Medicine

## 2015-06-14 ENCOUNTER — Encounter: Payer: Self-pay | Admitting: Internal Medicine

## 2015-06-14 ENCOUNTER — Ambulatory Visit (INDEPENDENT_AMBULATORY_CARE_PROVIDER_SITE_OTHER): Payer: Medicare Other | Admitting: Internal Medicine

## 2015-06-14 VITALS — BP 140/72 | HR 86 | Wt 133.0 lb

## 2015-06-14 DIAGNOSIS — Z23 Encounter for immunization: Secondary | ICD-10-CM | POA: Diagnosis not present

## 2015-06-14 DIAGNOSIS — E559 Vitamin D deficiency, unspecified: Secondary | ICD-10-CM

## 2015-06-14 DIAGNOSIS — M810 Age-related osteoporosis without current pathological fracture: Secondary | ICD-10-CM

## 2015-06-14 DIAGNOSIS — R911 Solitary pulmonary nodule: Secondary | ICD-10-CM

## 2015-06-14 NOTE — Progress Notes (Signed)
Subjective:  Patient ID: [name], female    DOB: Apr 21, 1946  Age: 69 y.o. MRN: [id number]  CC: No chief complaint on file.   HPI [name] presents for COPD, osteoporosis, GERD f/u. C/o cough w/Boniva (x3 mo)...  Outpatient Prescriptions Prior to Visit  Medication Sig Dispense Refill  . acetaminophen (TYLENOL) 500 MG tablet Per bottle as needed for pain/headache    . albuterol (PROVENTIL) (2.5 MG/3ML) 0.083% nebulizer solution Take 3 mLs (2.5 mg total) by nebulization every 4 (four) hours as needed for wheezing or shortness of breath. 25 vial 5  . B Complex-C (B-COMPLEX WITH VITAMIN C) tablet Take 1 tablet by mouth every morning.    . Calcium Carbonate-Vitamin D (CALCIUM-VITAMIN D) 500-200 MG-UNIT per tablet Take 1 tablet by mouth every morning.     . cetirizine (ZYRTEC) 10 MG tablet Take 10 mg by mouth at bedtime as needed (for nasal drainage).     . cholecalciferol (VITAMIN D) 1000 UNITS tablet Take 1,000 Units by mouth daily.     Marland Kitchen dextromethorphan-guaiFENesin (MUCINEX DM) 30-600 MG per 12 hr tablet Take 1-2 tablets by mouth 2 (two) times daily as needed (w/ flutter valve for cough).     . diazepam (VALIUM) 5 MG tablet Take 1 tablet (5 mg total) by mouth every 6 (six) hours as needed for anxiety. (Patient taking differently: Take 5 mg by mouth every 6 (six) hours as needed (nerves/ shortness of breath). ) 120 tablet 2  . famotidine (PEPCID) 20 MG tablet Take 20 mg by mouth at bedtime.    Marland Kitchen omeprazole (PRILOSEC) 40 MG capsule TAKE 1 CAPSULE BY MOUTH 30-60 MINUTES BEFORE FIRST AND LAST MEAL OF THE DAY (Patient taking differently: TAKE 1 CAPSULE BY MOUTH 30-60 MINUTES BEFORE FIRST) 60 capsule 5  . OXYGEN Inhale into the lungs. Wear 2L of O2 as needed for increased shortness of breath with activity and at bedtime    . polyethylene glycol (MIRALAX / GLYCOLAX) packet Take 17 g by mouth daily as needed (constipation).     . predniSONE (DELTASONE) 5 MG tablet TAKE AS DIRECTED 100  tablet 1  . PROAIR HFA 108 (90 BASE) MCG/ACT inhaler INHALE 2 PUFFS INTO LUNGS EVERY 4 HOURS AS NEEDED WHEEZING (Patient taking differently: INHALE 2 PUFFS INTO LUNGS EVERY 4-6 HOURS AS NEEDED WHEEZING/SHORTNESS OF BREATH (( PLAN B))) 8.5 Inhaler 2  . PROAIR HFA 108 (90 BASE) MCG/ACT inhaler INHALE 2 PUFFS INTO LUNGS EVERY 4 HOURS AS NEEDED WHEEZING 8.5 Inhaler 2  . Respiratory Therapy Supplies (FLUTTER) DEVI Use as directed 1 each 0  . SYMBICORT 160-4.5 MCG/ACT inhaler INHALE 2 PUFFS INTO THE LUNGS 2 (TWO) TIMES DAILY. 10.2 Inhaler 6  . Tiotropium Bromide Monohydrate (SPIRIVA RESPIMAT) 2.5 MCG/ACT AERS 2 each am (Patient taking differently: Take 2 puffs each morning ((PLAN A))) 1 Inhaler 0  . verapamil (VERELAN PM) 180 MG 24 hr capsule TAKE 1 CAPSULE (180 MG TOTAL) BY MOUTH AT BEDTIME. 30 capsule 11  . ibandronate (BONIVA) 150 MG tablet TAJE 1 TABLET BY MOUTH EVERY 30 DAYS. TAKE IN THE MORNING WITH FULL GLASS OF WATER ON EMPTY STOMACH. 3 tablet 1  . prednisoLONE 5 MG TABS tablet Take 1 tablet (5 mg total) by mouth daily. (Patient taking differently: Take 4 tablets daily-back to baseline, 1 tablet daily for 7 days then 1/2 tablet daily for worsening breathing) 30 each 11   No facility-administered medications prior to visit.    ROS Review of Systems  Constitutional: Negative  for chills, activity change, appetite change, fatigue and unexpected weight change.  HENT: Negative for congestion, mouth sores and sinus pressure.   Eyes: Negative for visual disturbance.  Respiratory: Positive for cough, shortness of breath and wheezing. Negative for chest tightness.   Gastrointestinal: Negative for nausea, abdominal pain and diarrhea.  Genitourinary: Negative for frequency, difficulty urinating and vaginal pain.  Musculoskeletal: Negative for back pain and gait problem.  Skin: Negative for pallor and rash.  Neurological: Negative for dizziness, tremors, weakness, numbness and headaches.    Psychiatric/Behavioral: Negative for confusion and sleep disturbance. The patient is nervous/anxious.     Objective:  BP 140/72 mmHg  Pulse 86  Wt 133 lb (60.328 kg)  SpO2 95%  BP Readings from Last 3 Encounters:  06/14/15 140/72  05/09/15 122/84  03/11/15 130/80    Wt Readings from Last 3 Encounters:  06/14/15 133 lb (60.328 kg)  05/09/15 134 lb (60.782 kg)  03/11/15 134 lb (60.782 kg)    Physical Exam  Constitutional: She appears well-developed. No distress.  HENT:  Head: Normocephalic.  Right Ear: External ear normal.  Left Ear: External ear normal.  Nose: Nose normal.  Mouth/Throat: Oropharynx is clear and moist.  Eyes: Conjunctivae are normal. Pupils are equal, round, and reactive to light. Right eye exhibits no discharge. Left eye exhibits no discharge.  Neck: Normal range of motion. Neck supple. No JVD present. No tracheal deviation present. No thyromegaly present.  Cardiovascular: Normal rate, regular rhythm and normal heart sounds.   Pulmonary/Chest: No stridor. No respiratory distress. She has wheezes.  Abdominal: Soft. Bowel sounds are normal. She exhibits no distension and no mass. There is no tenderness. There is no rebound and no guarding.  Musculoskeletal: She exhibits no edema or tenderness.  Lymphadenopathy:    She has no cervical adenopathy.  Neurological: She displays normal reflexes. No cranial nerve deficit. She exhibits normal muscle tone. Coordination normal.  Skin: No rash noted. No erythema.  Psychiatric: She has a normal mood and affect. Her behavior is normal. Judgment and thought content normal.    Lab Results  Component Value Date   WBC 12.3* 09/30/2014   HGB 13.8 09/30/2014   HCT 40.3 09/30/2014   PLT 344 09/30/2014   GLUCOSE 85 03/11/2015   CHOL 226* 05/16/2010   TRIG 286.0* 05/16/2010   HDL 73.10 05/16/2010   LDLDIRECT 108.0 05/16/2010   ALT 14 02/17/2014   AST 20 02/17/2014   NA 139 03/11/2015   K 3.6 03/11/2015   CL 104  03/11/2015   CREATININE 1.08 03/11/2015   BUN 12 03/11/2015   CO2 26 03/11/2015   TSH 0.42 10/01/2011   INR 0.98 09/30/2014   HGBA1C 5.4 01/05/2013    Ct Chest Wo Contrast  12/15/2014   CLINICAL DATA:  Lung nodule seen on chest radiograph. Home oxygen. COPD. Worsening shortness of breath.  EXAM: CT CHEST WITHOUT CONTRAST  TECHNIQUE: Multidetector CT imaging of the chest was performed following the standard protocol without IV contrast.  COMPARISON:  Multiple exams, including 06/12/2013  FINDINGS: Mediastinum/Nodes: Aortic, branch vessel, and coronary artery atherosclerotic calcification. 2.1 cm exophytic solid nodule from the left inferior thyroid lobe, previously the same size on 03/06/2013, previously not hypermetabolic. Inferior right thyroid nodule also observed, previously not hypermetabolic.  No pathologic thoracic adenopathy.  Lungs/Pleura: No lung nodule correlating with the density a chest radiography. Severe emphysema. The left lower lobe nodule shown on 03/06/2013 demonstrates reduced conspicuity and more of a scar-like, flat appearance. This lesion was  not appreciably hypermetabolic on PET-CT.  Upper abdomen: Left kidney upper pole cysts, including a complex hyper dense lesion, not appreciably changed from 06/12/2013 were these were not perceptibly hypermetabolic.  Musculoskeletal: There is callus formation in the left anterior fifth rib corresponding to the density on chest radiography. Appearance indicates healing rib fracture. There is also an old healed left tenth rib fracture.  Compression fractures observed at T9, T10, T11, and T12, with vertebral augmentations at T9 and T12.  IMPRESSION: 1. The density on chest radiography is cause by callus formation associated with a healing left anterior fifth rib fracture. There is no underlying worrisome pulmonary nodule. 2. The remotely (in 2014) worked up pulmonary nodule in the left lower lobe laterally is reduced in conspicuity on today's exam,  and likely postinflammatory. 3. Severe emphysema. 4. Atherosclerosis. 5. Complex left kidney upper pole cyst, stable. 6. Prior compression fractures at T12 and T9 with vertebral augmentation; subtle superior endplate compressions at T10 and T11. Findings favor osteoporosis.   Electronically Signed   By: Gaylyn Rong M.D.   On: 12/15/2014 12:00    Assessment & Plan:   Diagnoses and all orders for this visit:  Osteoporosis  Vitamin D deficiency  Lung nodule  Need for influenza vaccination -     Flu Vaccine QUAD 36+ mos IM  I have discontinued Ms. Maden's prednisoLONE and ibandronate. I am also having her maintain her cholecalciferol, cetirizine, calcium-vitamin D, FLUTTER, famotidine, polyethylene glycol, dextromethorphan-guaiFENesin, acetaminophen, diazepam, Tiotropium Bromide Monohydrate, albuterol, omeprazole, PROAIR HFA, OXYGEN, B-complex with vitamin C, predniSONE, SYMBICORT, verapamil, PROAIR HFA, and clotrimazole.  Meds ordered this encounter  Medications  . clotrimazole (MYCELEX) 10 MG troche    Sig:      Follow-up: Return in about 3 months (around 09/14/2015) for Wellness Exam.  Sonda Primes, MD

## 2015-06-14 NOTE — Assessment & Plan Note (Signed)
On Vit D 

## 2015-06-14 NOTE — Assessment & Plan Note (Signed)
Will monitor

## 2015-06-14 NOTE — Assessment & Plan Note (Signed)
compr fx On Prednisone prn 5/16 started Boniva. 8/16 stopped due to side effects. Cont Vit D 8/16 Info on Strontium. Info on Prolia given

## 2015-06-14 NOTE — Progress Notes (Signed)
Pre visit review using our clinic review tool, if applicable. No additional management support is needed unless otherwise documented below in the visit note. 

## 2015-07-08 ENCOUNTER — Other Ambulatory Visit: Payer: Self-pay | Admitting: Internal Medicine

## 2015-07-12 NOTE — Telephone Encounter (Signed)
Dr Sherene Sires, please advise if okay to refill

## 2015-07-13 ENCOUNTER — Ambulatory Visit: Payer: Medicare Other

## 2015-07-14 ENCOUNTER — Telehealth: Payer: Self-pay

## 2015-07-14 ENCOUNTER — Ambulatory Visit (INDEPENDENT_AMBULATORY_CARE_PROVIDER_SITE_OTHER): Payer: Medicare Other

## 2015-07-14 VITALS — BP 128/80 | Ht 61.5 in | Wt 131.0 lb

## 2015-07-14 DIAGNOSIS — Z0000 Encounter for general adult medical examination without abnormal findings: Secondary | ICD-10-CM | POA: Diagnosis not present

## 2015-07-14 NOTE — Telephone Encounter (Signed)
Dr. Macario Golds; AWV completed; The patient now feels the cough which she thought was due to [facility] may not have been the case. She is willing to re-try [facility] or other med? Do you want her to come back in and discuss?  She states the cough she had continued on after Boniva was stopped. Please advise.  Duane Boston

## 2015-07-14 NOTE — Patient Instructions (Addendum)
[name] , Thank you for taking time to come for your Medicare Wellness Visit. I appreciate your ongoing commitment to your health goals. Please review the following plan we discussed and let me know if I can assist you in the future.   Discussed energy conservation; Exercise goals; Working on energy conservation by tracking her level of energy and planning activities accordingly  Discuss Shingles. Will review with Dr. Alain Marion   Goals is to start walking 5 to 10  Minutes and increase as tolerated. Start breathing exercises daily and develop tracking to note progress.  Hep C antibody screen discussed  May take colo-guard  These are the goals we discussed: Goals    None      This is a list of the screening recommended for you and due dates:  Health Maintenance  Topic Date Due  .  Hepatitis C: One time screening is recommended by Center for Disease Control  (CDC) for  adults born from [birth year] through 1964.   05/08/1946  . Colon Cancer Screening  07/24/1997  . Shingles Vaccine  07/25/2007  . Pneumonia vaccines (2 of 2 - PPSV23) 03/10/2016  . Flu Shot  05/15/2016  . Mammogram  10/15/2016  . Tetanus Vaccine  04/05/2023  . DEXA scan (bone density measurement)  Completed     Health Maintenance Adopting a healthy lifestyle and getting preventive care can go a long way to promote health and wellness. Talk with your health care provider about what schedule of regular examinations is right for you. This is a good chance for you to check in with your provider about disease prevention and staying healthy. In between checkups, there are plenty of things you can do on your own. Experts have done a lot of research about which lifestyle changes and preventive measures are most likely to keep you healthy. Ask your health care provider for more information. WEIGHT AND DIET  Eat a healthy diet  Be sure to include plenty of vegetables, fruits, low-fat dairy products, and lean protein.  Do not  eat a lot of foods high in solid fats, added sugars, or salt.  Get regular exercise. This is one of the most important things you can do for your health.  Most adults should exercise for at least 150 minutes each week. The exercise should increase your heart rate and make you sweat (moderate-intensity exercise).  Most adults should also do strengthening exercises at least twice a week. This is in addition to the moderate-intensity exercise.  Maintain a healthy weight  Body mass index (BMI) is a measurement that can be used to identify possible weight problems. It estimates body fat based on height and weight. Your health care provider can help determine your BMI and help you achieve or maintain a healthy weight.  For females 14 years of age and older:   A BMI below 18.5 is considered underweight.  A BMI of 18.5 to 24.9 is normal.  A BMI of 25 to 29.9 is considered overweight.  A BMI of 30 and above is considered obese.  Watch levels of cholesterol and blood lipids  You should start having your blood tested for lipids and cholesterol at 68 years of age, then have this test every 5 years.  You may need to have your cholesterol levels checked more often if:  Your lipid or cholesterol levels are high.  You are older than 68 years of age.  You are at high risk for heart disease.  CANCER SCREENING  Lung Cancer  Lung cancer screening is recommended for adults 87-51 years old who are at high risk for lung cancer because of a history of smoking.  A yearly low-dose CT scan of the lungs is recommended for people who:  Currently smoke.  Have quit within the past 15 years.  Have at least a 30-pack-year history of smoking. A pack year is smoking an average of one pack of cigarettes a day for 1 year.  Yearly screening should continue until it has been 15 years since you quit.  Yearly screening should stop if you develop a health problem that would prevent you from having lung  cancer treatment.  Breast Cancer  Practice breast self-awareness. This means understanding how your breasts normally appear and feel.  It also means doing regular breast self-exams. Let your health care provider know about any changes, no matter how small.  If you are in your 20s or 30s, you should have a clinical breast exam (CBE) by a health care provider every 1-3 years as part of a regular health exam.  If you are 37 or older, have a CBE every year. Also consider having a breast X-ray (mammogram) every year.  If you have a family history of breast cancer, talk to your health care provider about genetic screening.  If you are at high risk for breast cancer, talk to your health care provider about having an MRI and a mammogram every year.  Breast cancer gene (BRCA) assessment is recommended for women who have family members with BRCA-related cancers. BRCA-related cancers include:  Breast.  Ovarian.  Tubal.  Peritoneal cancers.  Results of the assessment will determine the need for genetic counseling and BRCA1 and BRCA2 testing. Cervical Cancer Routine pelvic examinations to screen for cervical cancer are no longer recommended for nonpregnant women who are considered low risk for cancer of the pelvic organs (ovaries, uterus, and vagina) and who do not have symptoms. A pelvic examination may be necessary if you have symptoms including those associated with pelvic infections. Ask your health care provider if a screening pelvic exam is right for you.   The Pap test is the screening test for cervical cancer for women who are considered at risk.  If you had a hysterectomy for a problem that was not cancer or a condition that could lead to cancer, then you no longer need Pap tests.  If you are older than 65 years, and you have had normal Pap tests for the past 10 years, you no longer need to have Pap tests.  If you have had past treatment for cervical cancer or a condition that could  lead to cancer, you need Pap tests and screening for cancer for at least 20 years after your treatment.  If you no longer get a Pap test, assess your risk factors if they change (such as having a new sexual partner). This can affect whether you should start being screened again.  Some women have medical problems that increase their chance of getting cervical cancer. If this is the case for you, your health care provider may recommend more frequent screening and Pap tests.  The human papillomavirus (HPV) test is another test that may be used for cervical cancer screening. The HPV test looks for the virus that can cause cell changes in the cervix. The cells collected during the Pap test can be tested for HPV.  The HPV test can be used to screen women 62 years of age and older. Getting tested  for HPV can extend the interval between normal Pap tests from three to five years.  An HPV test also should be used to screen women of any age who have unclear Pap test results.  After 68 years of age, women should have HPV testing as often as Pap tests.  Colorectal Cancer  This type of cancer can be detected and often prevented.  Routine colorectal cancer screening usually begins at 68 years of age and continues through 68 years of age.  Your health care provider may recommend screening at an earlier age if you have risk factors for colon cancer.  Your health care provider may also recommend using home test kits to check for hidden blood in the stool.  A small camera at the end of a tube can be used to examine your colon directly (sigmoidoscopy or colonoscopy). This is done to check for the earliest forms of colorectal cancer.  Routine screening usually begins at age 65.  Direct examination of the colon should be repeated every 5-10 years through 68 years of age. However, you may need to be screened more often if early forms of precancerous polyps or small growths are found. Skin Cancer  Check your  skin from head to toe regularly.  Tell your health care provider about any new moles or changes in moles, especially if there is a change in a mole's shape or color.  Also tell your health care provider if you have a mole that is larger than the size of a pencil eraser.  Always use sunscreen. Apply sunscreen liberally and repeatedly throughout the day.  Protect yourself by wearing long sleeves, pants, a wide-brimmed hat, and sunglasses whenever you are outside. HEART DISEASE, DIABETES, AND HIGH BLOOD PRESSURE   Have your blood pressure checked at least every 1-2 years. High blood pressure causes heart disease and increases the risk of stroke.  If you are between 14 years and 53 years old, ask your health care provider if you should take aspirin to prevent strokes.  Have regular diabetes screenings. This involves taking a blood sample to check your fasting blood sugar level.  If you are at a normal weight and have a low risk for diabetes, have this test once every three years after 68 years of age.  If you are overweight and have a high risk for diabetes, consider being tested at a younger age or more often. PREVENTING INFECTION  Hepatitis B  If you have a higher risk for hepatitis B, you should be screened for this virus. You are considered at high risk for hepatitis B if:  You were born in a country where hepatitis B is common. Ask your health care provider which countries are considered high risk.  Your parents were born in a high-risk country, and you have not been immunized against hepatitis B (hepatitis B vaccine).  You have HIV or AIDS.  You use needles to inject street drugs.  You live with someone who has hepatitis B.  You have had sex with someone who has hepatitis B.  You get hemodialysis treatment.  You take certain medicines for conditions, including cancer, organ transplantation, and autoimmune conditions. Hepatitis C  Blood testing is recommended  for:  Everyone born from [birth year] through 1964.  Anyone with known risk factors for hepatitis C. Sexually transmitted infections (STIs)  You should be screened for sexually transmitted infections (STIs) including gonorrhea and chlamydia if:  You are sexually active and are younger than 68 years of age.  You are older than 68 years of age and your health care provider tells you that you are at risk for this type of infection.  Your sexual activity has changed since you were last screened and you are at an increased risk for chlamydia or gonorrhea. Ask your health care provider if you are at risk.  If you do not have HIV, but are at risk, it may be recommended that you take a prescription medicine daily to prevent HIV infection. This is called pre-exposure prophylaxis (PrEP). You are considered at risk if:  You are sexually active and do not regularly use condoms or know the HIV status of your partner(s).  You take drugs by injection.  You are sexually active with a partner who has HIV. Talk with your health care provider about whether you are at high risk of being infected with HIV. If you choose to begin PrEP, you should first be tested for HIV. You should then be tested every 3 months for as long as you are taking PrEP.  PREGNANCY   If you are premenopausal and you may become pregnant, ask your health care provider about preconception counseling.  If you may become pregnant, take 400 to 800 micrograms (mcg) of folic acid every day.  If you want to prevent pregnancy, talk to your health care provider about birth control (contraception). OSTEOPOROSIS AND MENOPAUSE   Osteoporosis is a disease in which the bones lose minerals and strength with aging. This can result in serious bone fractures. Your risk for osteoporosis can be identified using a bone density scan.  If you are 68 years of age or older, or if you are at risk for osteoporosis and fractures, ask your health care provider if you  should be screened.  Ask your health care provider whether you should take a calcium or vitamin D supplement to lower your risk for osteoporosis.  Menopause may have certain physical symptoms and risks.  Hormone replacement therapy may reduce some of these symptoms and risks. Talk to your health care provider about whether hormone replacement therapy is right for you.  HOME CARE INSTRUCTIONS   Schedule regular health, dental, and eye exams.  Stay current with your immunizations.   Do not use any tobacco products including cigarettes, chewing tobacco, or electronic cigarettes.  If you are pregnant, do not drink alcohol.  If you are breastfeeding, limit how much and how often you drink alcohol.  Limit alcohol intake to no more than 1 drink per day for nonpregnant women. One drink equals 12 ounces of beer, 5 ounces of wine, or 1 ounces of hard liquor.  Do not use street drugs.  Do not share needles.  Ask your health care provider for help if you need support or information about quitting drugs.  Tell your health care provider if you often feel depressed.  Tell your health care provider if you have ever been abused or do not feel safe at home. Document Released: 04/16/2011 Document Revised: 02/15/2014 Document Reviewed: 09/02/2013 [facility] Patient Information 2015 [place], [place]. This information is not intended to replace advice given to you by your health care provider. Make sure you discuss any questions you have with your health care provider.

## 2015-07-14 NOTE — Progress Notes (Addendum)
Subjective:   [name] is a 69 y.o. female who presents for Medicare Annual (Subsequent) preventive examination.  Review of Systems:  HRA assessment completed during visit; Patient is here for Annual Wellness Assessment The Patient was informed that this wellness visit is to identify risk and educate on how to reduce risk for increase disease through lifestyle changes.   ROS deferred to CPE exam with physician; problems being medically managed  Medical Hx;  HTN; Well controlled; BP 120/80 today Bronchitis; chronic respiratory failure-Dr. Sherene Sires; has management plan regarding all her meds and plan for exacerbation and when to notify the doctor;   Osteoporosis- considering treatment; will refer to Dr. Posey Rea Vit d deficiency; Vit d  94; will let Dr. Posey Rea know that the patient is considering boniva trail or other; Feels like "coughing" noted when taking Boniva was not related. Next office visit 12/5;   BMI: BMI  24.7  Diet; [place]; spouse was European trained and cooks meals Diet has plenty of protein; pork loins; minimal red meat;  They do not eat enough vegetables; grapes, watermelons, salads; Steam veg; Sweet tea; [place] sweetener; peanut butter Will eat ice cream  Exercise; Would like to do exercise; discussed chair Pilate moves or chair yoga as tolerated  ENERGY CONSERVATION Will try walking based on time; 10; 15 min and increase as tolerated as spouse walks 1 mile; recommended walking 5 -10 minutes total and build up. Stated she was in Pulmonary rehab and this did help her but she has not continued. Discussed frustration as she attempts to do to much, gets overwhelmed and spouse intervenes;  Assisted to create trial of noting energy level in the am and throughout the day and track on calender; plan day based on energy reserves and she will have feedback from month to month of improvements. She will then be accountable for her proper use of energy and breath  and her breathing exercises ,which may help her in the long run.   The patient agreed as she has a tendency to make the bed after she showered and is already tired etc and has not been doing her breathing exercises;    SAFETY/ one level home/transition in discussion; son is [place]; sister in [place]; spouse has son in [place]; will negotiate long term plan when ready.  Safety reviewed for the home; including removal of clutter; clear paths through the home, eliminating clutter, railing as needed; bathroom safety; community safety; smoke detectors Energy conservation is a struggle and ADL's can be difficult;    Medication review/ did discuss trying osteo med; may need to come back in for apt, will run by dr. Posey Rea.  Mobilization and Functional losses in the last year. ADL's are still a struggle but does continue to attempt   Urinary or fecal incontinence reviewed/no issue verbalized   Lifeline: [email or web address]/[email or web address]; [phone]   Counseling: Hep C reivewed and will defer to MD Colonoscopy; declined; discussed genetic colo guard and will discuss with Dr. Posey Rea EKG 02/17/2014 Dexa scan; 08/2014 lowest score -2.6 at the femoral neck/ ordered by Dr.  Corliss Skains-  Per Dr. Corliss Skains, she needs to have some treatment; the patient is willing to attempt;  Mammogram 10/15/2014 Hearing: adequate Ophthalmology exam; deferred due to time   Immunizations Due  Zoster/ Educated to check with insurance regarding coverage of Shingles vaccination on Part D or Part B and may have lower co-pay if provided on the Part D side Will defer to Dr. Posey Rea on taking post shingles episode  Current Care Team reviewed and updated      Objective:     Vitals: BP 128/80 mmHg  Ht 5' 1.5" (1.562 m)  Wt 131 lb (59.421 kg)  BMI 24.35 kg/m2  Tobacco History  Smoking status  . Former Smoker -- 1.00 packs/day for 40 years  . Types: Cigarettes  . Quit date: 04/01/2005  Smokeless  tobacco  . Never Used     Counseling given: Yes   Past Medical History  Diagnosis Date  . COPD (chronic obstructive pulmonary disease)   . Anxiety   . Hypertension   . Allergic rhinitis   . Goiter   . Asthma   . Shingles   . Pneumonia 2014  . GERD (gastroesophageal reflux disease)   . Arthritis     "joints" (02/17/2014)  . Chronic lower back pain   . On home oxygen therapy     "1.5L at night" (02/17/2014)  . Postherpetic neuralgia     "d/t shingles"   Past Surgical History  Procedure Laterality Date  . Tubal ligation    . Tonsillectomy    . Wisdom tooth extraction    . Thyroidectomy  12/29/08  . Cataract extraction w/ intraocular lens  implant, bilateral Bilateral    Family History  Problem Relation Age of Onset  . Hypertension Father    History  Sexual Activity  . Sexual Activity: No    Outpatient Encounter Prescriptions as of 07/14/2015  Medication Sig  . acetaminophen (TYLENOL) 500 MG tablet Per bottle as needed for pain/headache  . albuterol (PROVENTIL) (2.5 MG/3ML) 0.083% nebulizer solution Take 3 mLs (2.5 mg total) by nebulization every 4 (four) hours as needed for wheezing or shortness of breath.  . B Complex-C (B-COMPLEX WITH VITAMIN C) tablet Take 1 tablet by mouth every morning.  . Calcium Carbonate-Vitamin D (CALCIUM-VITAMIN D) 500-200 MG-UNIT per tablet Take 1 tablet by mouth every morning.   . cetirizine (ZYRTEC) 10 MG tablet Take 10 mg by mouth at bedtime as needed (for nasal drainage).   . cholecalciferol (VITAMIN D) 1000 UNITS tablet Take 1,000 Units by mouth daily.   Marland Kitchen dextromethorphan-guaiFENesin (MUCINEX DM) 30-600 MG per 12 hr tablet Take 1-2 tablets by mouth 2 (two) times daily as needed (w/ flutter valve for cough).   . diazepam (VALIUM) 5 MG tablet TAKE 1 TABLET BY MOUTH EVERY 6 HOURS AS NEEDED FOR ANXIETY  . famotidine (PEPCID) 20 MG tablet Take 20 mg by mouth at bedtime.  . OXYGEN Inhale into the lungs. Wear 2L of O2 as needed for increased  shortness of breath with activity and at bedtime  . polyethylene glycol (MIRALAX / GLYCOLAX) packet Take 17 g by mouth daily as needed (constipation).   . predniSONE (DELTASONE) 5 MG tablet TAKE AS DIRECTED  . PROAIR HFA 108 (90 BASE) MCG/ACT inhaler INHALE 2 PUFFS INTO LUNGS EVERY 4 HOURS AS NEEDED WHEEZING (Patient taking differently: INHALE 2 PUFFS INTO LUNGS EVERY 4-6 HOURS AS NEEDED WHEEZING/SHORTNESS OF BREATH (( PLAN B)))  . PROAIR HFA 108 (90 BASE) MCG/ACT inhaler INHALE 2 PUFFS INTO LUNGS EVERY 4 HOURS AS NEEDED WHEEZING  . Respiratory Therapy Supplies (FLUTTER) DEVI Use as directed  . SYMBICORT 160-4.5 MCG/ACT inhaler INHALE 2 PUFFS INTO THE LUNGS 2 (TWO) TIMES DAILY.  Marland Kitchen Tiotropium Bromide Monohydrate (SPIRIVA RESPIMAT) 2.5 MCG/ACT AERS 2 each am (Patient taking differently: Take 2 puffs each morning ((PLAN A)))  . verapamil (VERELAN PM) 180 MG 24 hr capsule TAKE 1 CAPSULE (180 MG TOTAL) BY  MOUTH AT BEDTIME.  . clotrimazole (MYCELEX) 10 MG troche   . omeprazole (PRILOSEC) 40 MG capsule TAKE 1 CAPSULE BY MOUTH 30-60 MINUTES BEFORE FIRST AND LAST MEAL OF THE DAY (Patient not taking: Reported on 07/14/2015)   No facility-administered encounter medications on file as of 07/14/2015.    Activities of Daily Living In your present state of health, do you have any difficulty performing the following activities: 07/14/2015 09/30/2014  Hearing? N N  Vision? - N  Difficulty concentrating or making decisions? - N  Walking or climbing stairs? - N  Dressing or bathing? - N    Patient Care Team: Tresa Garter, MD as PCP - General Nyoka Cowden, MD (Pulmonary Disease) Mia Creek, MD as Consulting Physician (Ophthalmology)    Assessment:   Assessment   Today patient counseled on age appropriate routine health concerns for screening and prevention, each reviewed and up to date or declined. Immunizations reviewed and up to date except for shingles.  Labs deferred for CPE or medical fup  by MD.  Risk factors for depression reviewed and negative. Hearing function and visual acuity are intact. ADLs screened and addressed as needed. Functional ability and level of safety reviewed and appropriate. Educated on memory loss and AD8 score 0.  Education, counseling and referrals performed based on assessed risks today. Patient provided with a copy of personalized plan for preventive services and due dates  FALL PREVENTION Educated on prevention falls; Exercise, toning and strengthening; Balance exercises  Wear Comfortable shoes; Vision checks; home modifications for safety  HEPATITIS SCREEN reviewed; no risk identified  TOBACCO use; quit 2006; had CT and other fup ETOH or other DRUG use was negative    Exercise Activities and Dietary recommendations    Exercise per tolerance 5 to 10 minutes walking and house work  Goals    None     Fall Risk Fall Risk  07/14/2015 06/14/2015  Falls in the past year? No No   Depression Screen PHQ 2/9 Scores 07/14/2015 06/14/2015  PHQ - 2 Score 0 1    The patient gets frustrated at her disability and verbalizes this. Will develop plan to help herself  Cognitive Testing No flowsheet data found.  Immunization History  Administered Date(s) Administered  . H1N1 09/21/2008  . Influenza Split 07/26/2011, 08/07/2012  . Influenza Whole 07/15/2009  . Influenza,inj,Quad PF,36+ Mos 07/08/2013, 07/23/2014, 06/14/2015  . Pneumococcal Conjugate-13 03/11/2015  . Pneumococcal Polysaccharide-23 09/15/2009  . Td 04/04/2013   Screening Tests Health Maintenance  Topic Date Due  . Hepatitis C Screening  May 05, 1946  . COLONOSCOPY  07/24/1997  . ZOSTAVAX  07/25/2007  . PNA vac Low Risk Adult (2 of 2 - PPSV23) 03/10/2016  . INFLUENZA VACCINE  05/15/2016  . MAMMOGRAM  10/15/2016  . TETANUS/TDAP  04/05/2023  . DEXA SCAN  Completed      Plan:   Discussed energy conservation; Exercise goals; Working on energy conservation by tracking her level of  energy and planning activities accordingly  Discuss Shingles. Will review with Dr. Posey Rea   Goals is to start walking 5 to 10  Minutes and increase as tolerated. Start breathing exercises daily and develop tracking to note progress.  Hep C antibody screen discussed  May take colo-guard  During the course of the visit the patient was educated and counseled about the following appropriate screening and preventive services:   Vaccines to include Pneumoccal, Influenza, Hepatitis B, Td, Zostavax, HCV/   Electrocardiogram 02/17/2014  Cardiovascular Disease / reviewed  Colorectal cancer screening/ not completed; may consider colo-guard  Bone density screening/ completed may consider therapy  Diabetes screening/ deferred  Glaucoma screening/ did not verbalize issue  Mammography/Jan 2016  Nutrition counseling / discussed  Patient Instructions (the written plan) was given to the patient.   Montine Circle, RN  07/14/2015     Medical screening examination/treatment/procedure(s) were performed by non-physician practitioner and as supervising physician I was immediately available for consultation/collaboration. I agree with above. Sonda Primes, MD

## 2015-07-16 ENCOUNTER — Other Ambulatory Visit: Payer: Self-pay | Admitting: Internal Medicine

## 2015-07-18 NOTE — Telephone Encounter (Signed)
Dr Sherene Sires, Please advise if ok to refill Valium .  Last given on 11/09/14 for #120 tab with 2 refills.

## 2015-07-19 ENCOUNTER — Other Ambulatory Visit: Payer: Self-pay

## 2015-07-19 MED ORDER — IBANDRONATE SODIUM 150 MG PO TABS: ORAL_TABLET | ORAL | Status: DC

## 2015-07-19 NOTE — Telephone Encounter (Signed)
Ok to try Boniva again Thx

## 2015-07-25 ENCOUNTER — Telehealth: Payer: Self-pay

## 2015-07-25 ENCOUNTER — Other Ambulatory Visit: Payer: Self-pay

## 2015-07-25 NOTE — Telephone Encounter (Signed)
This is late note for Boniva reordered on 10/4 per MD verbal order after the patient stated during AWV her "cough" was not due to the medication and would like to try it again. Re-ordered for her to re-start; will call Dr. Posey Rea for issues.

## 2015-08-01 ENCOUNTER — Emergency Department (HOSPITAL_COMMUNITY): Payer: Medicare Other

## 2015-08-01 ENCOUNTER — Inpatient Hospital Stay (HOSPITAL_COMMUNITY)
Admission: EM | Admit: 2015-08-01 | Discharge: 2015-08-16 | DRG: 870 | Disposition: E | Payer: Medicare Other | Attending: Pulmonary Disease | Admitting: Pulmonary Disease

## 2015-08-01 ENCOUNTER — Other Ambulatory Visit: Payer: Self-pay | Admitting: Internal Medicine

## 2015-08-01 DIAGNOSIS — N179 Acute kidney failure, unspecified: Secondary | ICD-10-CM | POA: Diagnosis present

## 2015-08-01 DIAGNOSIS — J9621 Acute and chronic respiratory failure with hypoxia: Secondary | ICD-10-CM | POA: Diagnosis present

## 2015-08-01 DIAGNOSIS — E872 Acidosis, unspecified: Secondary | ICD-10-CM | POA: Diagnosis present

## 2015-08-01 DIAGNOSIS — Z8249 Family history of ischemic heart disease and other diseases of the circulatory system: Secondary | ICD-10-CM

## 2015-08-01 DIAGNOSIS — R000 Tachycardia, unspecified: Secondary | ICD-10-CM | POA: Diagnosis present

## 2015-08-01 DIAGNOSIS — I4892 Unspecified atrial flutter: Secondary | ICD-10-CM | POA: Diagnosis not present

## 2015-08-01 DIAGNOSIS — K5900 Constipation, unspecified: Secondary | ICD-10-CM | POA: Diagnosis present

## 2015-08-01 DIAGNOSIS — E870 Hyperosmolality and hypernatremia: Secondary | ICD-10-CM | POA: Diagnosis not present

## 2015-08-01 DIAGNOSIS — Z452 Encounter for adjustment and management of vascular access device: Secondary | ICD-10-CM

## 2015-08-01 DIAGNOSIS — Z8701 Personal history of pneumonia (recurrent): Secondary | ICD-10-CM

## 2015-08-01 DIAGNOSIS — Z66 Do not resuscitate: Secondary | ICD-10-CM | POA: Diagnosis present

## 2015-08-01 DIAGNOSIS — Z961 Presence of intraocular lens: Secondary | ICD-10-CM | POA: Diagnosis present

## 2015-08-01 DIAGNOSIS — Z7951 Long term (current) use of inhaled steroids: Secondary | ICD-10-CM

## 2015-08-01 DIAGNOSIS — M545 Low back pain: Secondary | ICD-10-CM | POA: Diagnosis present

## 2015-08-01 DIAGNOSIS — I824Z3 Acute embolism and thrombosis of unspecified deep veins of distal lower extremity, bilateral: Secondary | ICD-10-CM | POA: Diagnosis present

## 2015-08-01 DIAGNOSIS — Z791 Long term (current) use of non-steroidal anti-inflammatories (NSAID): Secondary | ICD-10-CM | POA: Diagnosis not present

## 2015-08-01 DIAGNOSIS — J9600 Acute respiratory failure, unspecified whether with hypoxia or hypercapnia: Secondary | ICD-10-CM | POA: Diagnosis not present

## 2015-08-01 DIAGNOSIS — F419 Anxiety disorder, unspecified: Secondary | ICD-10-CM | POA: Diagnosis present

## 2015-08-01 DIAGNOSIS — B0229 Other postherpetic nervous system involvement: Secondary | ICD-10-CM | POA: Diagnosis present

## 2015-08-01 DIAGNOSIS — L89151 Pressure ulcer of sacral region, stage 1: Secondary | ICD-10-CM | POA: Diagnosis present

## 2015-08-01 DIAGNOSIS — G9340 Encephalopathy, unspecified: Secondary | ICD-10-CM | POA: Diagnosis present

## 2015-08-01 DIAGNOSIS — I82409 Acute embolism and thrombosis of unspecified deep veins of unspecified lower extremity: Secondary | ICD-10-CM

## 2015-08-01 DIAGNOSIS — K838 Other specified diseases of biliary tract: Secondary | ICD-10-CM

## 2015-08-01 DIAGNOSIS — J9601 Acute respiratory failure with hypoxia: Secondary | ICD-10-CM

## 2015-08-01 DIAGNOSIS — K219 Gastro-esophageal reflux disease without esophagitis: Secondary | ICD-10-CM | POA: Diagnosis present

## 2015-08-01 DIAGNOSIS — E861 Hypovolemia: Secondary | ICD-10-CM | POA: Diagnosis present

## 2015-08-01 DIAGNOSIS — R6520 Severe sepsis without septic shock: Secondary | ICD-10-CM | POA: Diagnosis present

## 2015-08-01 DIAGNOSIS — D649 Anemia, unspecified: Secondary | ICD-10-CM | POA: Diagnosis present

## 2015-08-01 DIAGNOSIS — Z9981 Dependence on supplemental oxygen: Secondary | ICD-10-CM | POA: Diagnosis not present

## 2015-08-01 DIAGNOSIS — Z9841 Cataract extraction status, right eye: Secondary | ICD-10-CM

## 2015-08-01 DIAGNOSIS — A419 Sepsis, unspecified organism: Secondary | ICD-10-CM | POA: Diagnosis present

## 2015-08-01 DIAGNOSIS — Z87891 Personal history of nicotine dependence: Secondary | ICD-10-CM

## 2015-08-01 DIAGNOSIS — I10 Essential (primary) hypertension: Secondary | ICD-10-CM | POA: Diagnosis present

## 2015-08-01 DIAGNOSIS — G8929 Other chronic pain: Secondary | ICD-10-CM | POA: Diagnosis present

## 2015-08-01 DIAGNOSIS — E874 Mixed disorder of acid-base balance: Secondary | ICD-10-CM | POA: Diagnosis present

## 2015-08-01 DIAGNOSIS — J80 Acute respiratory distress syndrome: Secondary | ICD-10-CM | POA: Diagnosis not present

## 2015-08-01 DIAGNOSIS — Z9842 Cataract extraction status, left eye: Secondary | ICD-10-CM | POA: Diagnosis not present

## 2015-08-01 DIAGNOSIS — J9622 Acute and chronic respiratory failure with hypercapnia: Secondary | ICD-10-CM | POA: Diagnosis present

## 2015-08-01 DIAGNOSIS — Z7952 Long term (current) use of systemic steroids: Secondary | ICD-10-CM | POA: Diagnosis not present

## 2015-08-01 DIAGNOSIS — J45909 Unspecified asthma, uncomplicated: Secondary | ICD-10-CM | POA: Diagnosis present

## 2015-08-01 DIAGNOSIS — N390 Urinary tract infection, site not specified: Secondary | ICD-10-CM | POA: Diagnosis present

## 2015-08-01 DIAGNOSIS — Z515 Encounter for palliative care: Secondary | ICD-10-CM | POA: Diagnosis present

## 2015-08-01 DIAGNOSIS — J441 Chronic obstructive pulmonary disease with (acute) exacerbation: Secondary | ICD-10-CM | POA: Diagnosis present

## 2015-08-01 DIAGNOSIS — E876 Hypokalemia: Secondary | ICD-10-CM | POA: Diagnosis present

## 2015-08-01 DIAGNOSIS — M1990 Unspecified osteoarthritis, unspecified site: Secondary | ICD-10-CM | POA: Diagnosis present

## 2015-08-01 DIAGNOSIS — J9690 Respiratory failure, unspecified, unspecified whether with hypoxia or hypercapnia: Secondary | ICD-10-CM | POA: Diagnosis not present

## 2015-08-01 DIAGNOSIS — L8990 Pressure ulcer of unspecified site, unspecified stage: Secondary | ICD-10-CM | POA: Insufficient documentation

## 2015-08-01 DIAGNOSIS — Z978 Presence of other specified devices: Secondary | ICD-10-CM

## 2015-08-01 DIAGNOSIS — R0602 Shortness of breath: Secondary | ICD-10-CM | POA: Diagnosis present

## 2015-08-01 DIAGNOSIS — Z9289 Personal history of other medical treatment: Secondary | ICD-10-CM

## 2015-08-01 LAB — BASIC METABOLIC PANEL
ANION GAP: 16 — AB (ref 5–15)
BUN: 26 mg/dL — AB (ref 6–20)
CHLORIDE: 105 mmol/L (ref 101–111)
CO2: 13 mmol/L — ABNORMAL LOW (ref 22–32)
Calcium: 8.9 mg/dL (ref 8.9–10.3)
Creatinine, Ser: 2.24 mg/dL — ABNORMAL HIGH (ref 0.44–1.00)
GFR calc Af Amer: 25 mL/min — ABNORMAL LOW (ref >60)
GFR calc non Af Amer: 21 mL/min — ABNORMAL LOW (ref >60)
Glucose, Bld: 118 mg/dL — ABNORMAL HIGH (ref 65–99)
POTASSIUM: 3.7 mmol/L (ref 3.5–5.1)
SODIUM: 134 mmol/L — AB (ref 135–145)

## 2015-08-01 LAB — CBC WITH DIFFERENTIAL/PLATELET
Basophils Absolute: 0.1 10*3/uL (ref 0.0–0.1)
Basophils Relative: 1 %
EOS PCT: 1 %
Eosinophils Absolute: 0.1 10*3/uL (ref 0.0–0.7)
HEMATOCRIT: 46.1 % — AB (ref 36.0–46.0)
HEMOGLOBIN: 15.6 g/dL — AB (ref 12.0–15.0)
LYMPHS ABS: 4.4 10*3/uL — AB (ref 0.7–4.0)
LYMPHS PCT: 22 %
MCH: 30.8 pg (ref 26.0–34.0)
MCHC: 33.8 g/dL (ref 30.0–36.0)
MCV: 90.9 fL (ref 78.0–100.0)
Monocytes Absolute: 2.1 10*3/uL — ABNORMAL HIGH (ref 0.1–1.0)
Monocytes Relative: 11 %
NEUTROS ABS: 13.6 10*3/uL — AB (ref 1.7–7.7)
Neutrophils Relative %: 67 %
PLATELETS: 417 10*3/uL — AB (ref 150–400)
RBC: 5.07 MIL/uL (ref 3.87–5.11)
RDW: 14.2 % (ref 11.5–15.5)
WBC: 20.4 10*3/uL — AB (ref 4.0–10.5)

## 2015-08-01 LAB — I-STAT TROPONIN, ED: Troponin i, poc: 0.04 ng/mL (ref 0.00–0.08)

## 2015-08-01 LAB — I-STAT ARTERIAL BLOOD GAS, ED
Acid-base deficit: 12 mmol/L — ABNORMAL HIGH (ref 0.0–2.0)
BICARBONATE: 18 meq/L — AB (ref 20.0–24.0)
O2 Saturation: 99 %
PCO2 ART: 67.5 mmHg — AB (ref 35.0–45.0)
Patient temperature: 103.2
TCO2: 20 mmol/L (ref 0–100)
pH, Arterial: 7.051 — CL (ref 7.350–7.450)
pO2, Arterial: 240 mmHg — ABNORMAL HIGH (ref 80.0–100.0)

## 2015-08-01 LAB — URINE MICROSCOPIC-ADD ON

## 2015-08-01 LAB — URINALYSIS, ROUTINE W REFLEX MICROSCOPIC
GLUCOSE, UA: NEGATIVE mg/dL
HGB URINE DIPSTICK: NEGATIVE
Ketones, ur: 15 mg/dL — AB
Nitrite: NEGATIVE
PROTEIN: 30 mg/dL — AB
SPECIFIC GRAVITY, URINE: 1.018 (ref 1.005–1.030)
Urobilinogen, UA: 1 mg/dL (ref 0.0–1.0)
pH: 5.5 (ref 5.0–8.0)

## 2015-08-01 LAB — TRIGLYCERIDES: Triglycerides: 176 mg/dL — ABNORMAL HIGH (ref <150)

## 2015-08-01 LAB — I-STAT CG4 LACTIC ACID, ED: Lactic Acid, Venous: 2.4 mmol/L (ref 0.5–2.0)

## 2015-08-01 LAB — BRAIN NATRIURETIC PEPTIDE: B NATRIURETIC PEPTIDE 5: 97.2 pg/mL (ref 0.0–100.0)

## 2015-08-01 MED ORDER — SENNOSIDES 8.8 MG/5ML PO SYRP
5.0000 mL | ORAL_SOLUTION | Freq: Two times a day (BID) | ORAL | Status: DC | PRN
  Administered 2015-08-02: 5 mL
  Filled 2015-08-01 (×2): qty 5

## 2015-08-01 MED ORDER — ETOMIDATE 2 MG/ML IV SOLN
INTRAVENOUS | Status: AC | PRN
  Administered 2015-08-01: 20 mg via INTRAVENOUS

## 2015-08-01 MED ORDER — LIDOCAINE HCL (CARDIAC) 20 MG/ML IV SOLN
INTRAVENOUS | Status: AC
  Filled 2015-08-01: qty 5

## 2015-08-01 MED ORDER — SUCCINYLCHOLINE CHLORIDE 20 MG/ML IJ SOLN
INTRAMUSCULAR | Status: AC
  Filled 2015-08-01: qty 1

## 2015-08-01 MED ORDER — VITAL HIGH PROTEIN PO LIQD
1000.0000 mL | ORAL | Status: DC
  Administered 2015-08-02: 1000 mL
  Filled 2015-08-01 (×3): qty 1000

## 2015-08-01 MED ORDER — VANCOMYCIN HCL 10 G IV SOLR
1250.0000 mg | Freq: Once | INTRAVENOUS | Status: DC
  Administered 2015-08-01: 1250 mg via INTRAVENOUS
  Filled 2015-08-01: qty 1250

## 2015-08-01 MED ORDER — ALBUTEROL SULFATE (2.5 MG/3ML) 0.083% IN NEBU
2.5000 mg | INHALATION_SOLUTION | RESPIRATORY_TRACT | Status: DC | PRN
  Administered 2015-08-03: 2.5 mg via RESPIRATORY_TRACT
  Filled 2015-08-01: qty 3

## 2015-08-01 MED ORDER — ANTISEPTIC ORAL RINSE SOLUTION (CORINZ)
7.0000 mL | OROMUCOSAL | Status: DC
  Administered 2015-08-02 – 2015-08-11 (×88): 7 mL via OROMUCOSAL

## 2015-08-01 MED ORDER — DEXTROSE 5 % IV SOLN
0.0000 ug/min | INTRAVENOUS | Status: DC
  Filled 2015-08-01: qty 1

## 2015-08-01 MED ORDER — METHYLPREDNISOLONE SODIUM SUCC 40 MG IJ SOLR
20.0000 mg | Freq: Two times a day (BID) | INTRAMUSCULAR | Status: DC
  Administered 2015-08-02 – 2015-08-05 (×9): 20 mg via INTRAVENOUS
  Filled 2015-08-01 (×10): qty 0.5

## 2015-08-01 MED ORDER — ROCURONIUM BROMIDE 50 MG/5ML IV SOLN
INTRAVENOUS | Status: AC | PRN
  Administered 2015-08-01: 70 mg via INTRAVENOUS

## 2015-08-01 MED ORDER — IPRATROPIUM-ALBUTEROL 0.5-2.5 (3) MG/3ML IN SOLN
3.0000 mL | Freq: Four times a day (QID) | RESPIRATORY_TRACT | Status: DC
  Administered 2015-08-02 – 2015-08-05 (×14): 3 mL via RESPIRATORY_TRACT
  Filled 2015-08-01 (×14): qty 3

## 2015-08-01 MED ORDER — PANTOPRAZOLE SODIUM 40 MG PO PACK
40.0000 mg | PACK | ORAL | Status: DC
  Administered 2015-08-02 – 2015-08-10 (×10): 40 mg
  Filled 2015-08-01 (×11): qty 20

## 2015-08-01 MED ORDER — PROPOFOL 1000 MG/100ML IV EMUL
INTRAVENOUS | Status: AC
  Filled 2015-08-01: qty 100

## 2015-08-01 MED ORDER — PROPOFOL 1000 MG/100ML IV EMUL
0.0000 ug/kg/min | INTRAVENOUS | Status: DC
  Administered 2015-08-02: 30 ug/kg/min via INTRAVENOUS
  Administered 2015-08-02: 30.556 ug/kg/min via INTRAVENOUS
  Administered 2015-08-02: 40 ug/kg/min via INTRAVENOUS
  Administered 2015-08-02: 33.333 ug/kg/min via INTRAVENOUS
  Administered 2015-08-03 – 2015-08-04 (×4): 40 ug/kg/min via INTRAVENOUS
  Filled 2015-08-01 (×9): qty 100

## 2015-08-01 MED ORDER — SODIUM CHLORIDE 0.9 % IV SOLN
INTRAVENOUS | Status: AC | PRN
  Administered 2015-08-01: 1000 mL via INTRAVENOUS

## 2015-08-01 MED ORDER — ETOMIDATE 2 MG/ML IV SOLN
INTRAVENOUS | Status: AC
  Filled 2015-08-01: qty 20

## 2015-08-01 MED ORDER — ROCURONIUM BROMIDE 50 MG/5ML IV SOLN
INTRAVENOUS | Status: AC
  Filled 2015-08-01: qty 2

## 2015-08-01 MED ORDER — HEPARIN SODIUM (PORCINE) 5000 UNIT/ML IJ SOLN
5000.0000 [IU] | Freq: Three times a day (TID) | INTRAMUSCULAR | Status: DC
  Administered 2015-08-02 – 2015-08-05 (×12): 5000 [IU] via SUBCUTANEOUS
  Filled 2015-08-01 (×14): qty 1

## 2015-08-01 MED ORDER — BISACODYL 10 MG RE SUPP: 10.0000 mg | Freq: Every day | RECTAL | Status: DC | PRN

## 2015-08-01 MED ORDER — ACETAMINOPHEN 650 MG RE SUPP
650.0000 mg | Freq: Once | RECTAL | Status: AC
  Administered 2015-08-01: 650 mg via RECTAL
  Filled 2015-08-01: qty 1

## 2015-08-01 MED ORDER — SODIUM CHLORIDE 0.9 % IV BOLUS (SEPSIS)
1000.0000 mL | INTRAVENOUS | Status: AC
  Administered 2015-08-01: 1000 mL via INTRAVENOUS

## 2015-08-01 MED ORDER — DEXTROSE 5 % IV SOLN
500.0000 mg | INTRAVENOUS | Status: DC
  Administered 2015-08-02 – 2015-08-03 (×3): 500 mg via INTRAVENOUS
  Filled 2015-08-01 (×3): qty 500

## 2015-08-01 MED ORDER — PIPERACILLIN-TAZOBACTAM 3.375 G IVPB: 3.3750 g | Freq: Three times a day (TID) | INTRAVENOUS | Status: DC

## 2015-08-01 MED ORDER — PIPERACILLIN-TAZOBACTAM 3.375 G IVPB 30 MIN
3.3750 g | Freq: Once | INTRAVENOUS | Status: AC
  Administered 2015-08-01: 3.375 g via INTRAVENOUS

## 2015-08-01 MED ORDER — PROPOFOL 1000 MG/100ML IV EMUL
5.0000 ug/kg/min | INTRAVENOUS | Status: DC
  Administered 2015-08-01: 10 ug/kg/min via INTRAVENOUS

## 2015-08-01 MED ORDER — ALBUTEROL SULFATE HFA 108 (90 BASE) MCG/ACT IN AERS: INHALATION_SPRAY | RESPIRATORY_TRACT | Status: AC

## 2015-08-01 MED ORDER — CHLORHEXIDINE GLUCONATE 0.12% ORAL RINSE (MEDLINE KIT)
15.0000 mL | Freq: Two times a day (BID) | OROMUCOSAL | Status: DC
  Administered 2015-08-02 – 2015-08-11 (×20): 15 mL via OROMUCOSAL

## 2015-08-01 MED ORDER — DEXTROSE 5 % IV SOLN
1.0000 g | INTRAVENOUS | Status: DC
  Administered 2015-08-02 – 2015-08-03 (×3): 1 g via INTRAVENOUS
  Filled 2015-08-01 (×3): qty 10

## 2015-08-01 MED ORDER — LACTATED RINGERS IV SOLN
INTRAVENOUS | Status: DC
  Administered 2015-08-02: 125 mL/h via INTRAVENOUS
  Administered 2015-08-02 – 2015-08-04 (×8): via INTRAVENOUS
  Administered 2015-08-04: 125 mL/h via INTRAVENOUS
  Administered 2015-08-05 – 2015-08-06 (×4): via INTRAVENOUS

## 2015-08-01 MED ORDER — FENTANYL CITRATE (PF) 100 MCG/2ML IJ SOLN
50.0000 ug | INTRAMUSCULAR | Status: DC | PRN
  Administered 2015-08-02 – 2015-08-05 (×8): 50 ug via INTRAVENOUS
  Filled 2015-08-01 (×7): qty 2

## 2015-08-01 NOTE — ED Notes (Signed)
CCM at the bedside. °

## 2015-08-01 NOTE — ED Provider Notes (Signed)
CSN: [id number]     Arrival date & time Aug 01, 2015  2032 History   First MD Initiated Contact with Patient Aug 01, 2015 2059     Chief Complaint  Patient presents with  . Respiratory Distress     (Consider location/radiation/quality/duration/timing/severity/associated sxs/prior Treatment) Patient is a 69 y.o. female presenting with shortness of breath.  Shortness of Breath Severity:  Severe Onset quality:  Gradual Timing:  Constant Progression:  Worsening Chronicity:  New Context: URI   Relieved by:  Nothing Worsened by:  Nothing tried Ineffective treatments:  Inhaler, oxygen and position changes   Past Medical History  Diagnosis Date  . COPD (chronic obstructive pulmonary disease)   . Anxiety   . Hypertension   . Allergic rhinitis   . Goiter   . Asthma   . Shingles   . Pneumonia 2014  . GERD (gastroesophageal reflux disease)   . Arthritis     "joints" (02/17/2014)  . Chronic lower back pain   . On home oxygen therapy     "1.5L at night" (02/17/2014)  . Postherpetic neuralgia     "d/t shingles"   Past Surgical History  Procedure Laterality Date  . Tubal ligation    . Tonsillectomy    . Wisdom tooth extraction    . Thyroidectomy  12/29/08  . Cataract extraction w/ intraocular lens  implant, bilateral Bilateral    Family History  Problem Relation Age of Onset  . Hypertension Father    Social History  Substance Use Topics  . Smoking status: Former Smoker -- 1.00 packs/day for 40 years    Types: Cigarettes    Quit date: 04/01/2005  . Smokeless tobacco: Never Used  . Alcohol Use: 37.8 oz/week    63 Shots of liquor per week     Comment: 02/17/2014 "3 shots liquor, 3 times/day"--- stopped 5/15   OB History    No data available     Review of Systems  Unable to perform ROS: Acuity of condition  Respiratory: Positive for shortness of breath.      Allergies  Boniva and Tramadol  Home Medications   Prior to Admission medications   Medication Sig Start Date End  Date Taking? Authorizing Provider  acetaminophen (TYLENOL) 500 MG tablet Per bottle as needed for pain/headache    Historical Provider, MD  albuterol (PROAIR HFA) 108 (90 BASE) MCG/ACT inhaler INHALE 2 PUFFS INTO LUNGS EVERY 4 HOURS AS NEEDED WHEEZING Aug 01, 2015   Nyoka CowdenMichael B Wert, MD  albuterol (PROVENTIL) (2.5 MG/3ML) 0.083% nebulizer solution Take 3 mLs (2.5 mg total) by nebulization every 4 (four) hours as needed for wheezing or shortness of breath. 12/07/14   Tammy S Parrett, NP  B Complex-C (B-COMPLEX WITH VITAMIN C) tablet Take 1 tablet by mouth every morning.    Historical Provider, MD  Calcium Carbonate-Vitamin D (CALCIUM-VITAMIN D) 500-200 MG-UNIT per tablet Take 1 tablet by mouth every morning.     Historical Provider, MD  cetirizine (ZYRTEC) 10 MG tablet Take 10 mg by mouth at bedtime as needed (for nasal drainage).     Historical Provider, MD  cholecalciferol (VITAMIN D) 1000 UNITS tablet Take 1,000 Units by mouth daily.     Historical Provider, MD  clotrimazole (MYCELEX) 10 MG troche  05/09/15   Historical Provider, MD  dextromethorphan-guaiFENesin (MUCINEX DM) 30-600 MG per 12 hr tablet Take 1-2 tablets by mouth 2 (two) times daily as needed (w/ flutter valve for cough).     Historical Provider, MD  diazepam (VALIUM) 5 MG tablet TAKE  1 TABLET BY MOUTH EVERY 6 HOURS AS NEEDED FOR ANXIETY 07/18/15   Nyoka Cowden, MD  famotidine (PEPCID) 20 MG tablet Take 20 mg by mouth at bedtime.    Historical Provider, MD  ibandronate (BONIVA) 150 MG tablet TAJE 1 TABLET BY MOUTH EVERY 30 DAYS. TAKE IN THE MORNING WITH FULL GLASS OF WATER ON EMPTY STOMACH. 07/19/15   Aleksei Plotnikov V, MD  omeprazole (PRILOSEC) 40 MG capsule TAKE 1 CAPSULE BY MOUTH 30-60 MINUTES BEFORE FIRST AND LAST MEAL OF THE DAY Patient not taking: Reported on 07/14/2015 01/05/15   Nyoka Cowden, MD  OXYGEN Inhale into the lungs. Wear 2L of O2 as needed for increased shortness of breath with activity and at bedtime    Historical  Provider, MD  polyethylene glycol (MIRALAX / GLYCOLAX) packet Take 17 g by mouth daily as needed (constipation).     Historical Provider, MD  predniSONE (DELTASONE) 5 MG tablet TAKE AS DIRECTED 05/23/15   Nyoka Cowden, MD  PROAIR HFA 108 (90 BASE) MCG/ACT inhaler INHALE 2 PUFFS INTO LUNGS EVERY 4 HOURS AS NEEDED WHEEZING Patient taking differently: INHALE 2 PUFFS INTO LUNGS EVERY 4-6 HOURS AS NEEDED WHEEZING/SHORTNESS OF BREATH (( PLAN B)) 05/12/15   Nyoka Cowden, MD  Respiratory Therapy Supplies (FLUTTER) DEVI Use as directed 08/20/14   Nyoka Cowden, MD  SYMBICORT 160-4.5 MCG/ACT inhaler INHALE 2 PUFFS INTO THE LUNGS 2 (TWO) TIMES DAILY. 05/30/15   Nyoka Cowden, MD  Tiotropium Bromide Monohydrate (SPIRIVA RESPIMAT) 2.5 MCG/ACT AERS 2 each am Patient taking differently: Take 2 puffs each morning ((PLAN A)) 11/09/14   Nyoka Cowden, MD  verapamil (VERELAN PM) 180 MG 24 hr capsule TAKE 1 CAPSULE (180 MG TOTAL) BY MOUTH AT BEDTIME. 06/08/15   Aleksei Plotnikov V, MD   BP 144/79 mmHg  Pulse 123  Resp 39  SpO2 99% Physical Exam  Constitutional: She appears well-developed and well-nourished. She appears distressed.  Severely distressed and lethargic older female with minimal inspiratory effort  HENT:  Head: Normocephalic.  Eyes: Pupils are equal, round, and reactive to light.  Neck: Normal range of motion.  Cardiovascular:  Tachycardic  Pulmonary/Chest: She is in respiratory distress. She has wheezes.  Wheezing bilaterally with poor air movement.  Abdominal: Soft. She exhibits no distension.  Musculoskeletal: Normal range of motion.  Neurological:  Distressed. Unable to answer questions.   Skin: Skin is warm. She is diaphoretic.  Psychiatric:  Distressed  Nursing note and vitals reviewed.   ED Course  .Intubation Date/Time: 08/02/2015 12:03 AM Performed by: Deirdre Peer Authorized by: Deirdre Peer Consent: The procedure was performed in an emergent situation. Patient  identity confirmed: arm band Time out: Immediately prior to procedure a "time out" was called to verify the correct patient, procedure, equipment, support staff and site/side marked as required. Indications: respiratory failure Intubation method: video-assisted Patient status: paralyzed (RSI) Preoxygenation: nonrebreather mask Tube size: 7.5 mm Tube type: cuffed Number of attempts: 3 Ventilation between attempts: BVM Cords visualized: yes Post-procedure assessment: chest rise,  ETCO2 monitor and CO2 detector Breath sounds: equal Cuff inflated: yes ETT to lip: 24 cm ETT to teeth: 23 cm Tube secured with: ETT holder Chest x-ray interpreted by me. Chest x-ray findings: endotracheal tube too low Tube repositioned: tube repositioned successfully Patient tolerance: Patient tolerated the procedure well with no immediate complications   (including critical care time) Labs Review Labs Reviewed  TRIGLYCERIDES - Abnormal; Notable for the following:    Triglycerides 176 (*)  All other components within normal limits  BASIC METABOLIC PANEL - Abnormal; Notable for the following:    Sodium 134 (*)    CO2 13 (*)    Glucose, Bld 118 (*)    BUN 26 (*)    Creatinine, Ser 2.24 (*)    GFR calc non Af Amer 21 (*)    GFR calc Af Amer 25 (*)    Anion gap 16 (*)    All other components within normal limits  CBC WITH DIFFERENTIAL/PLATELET - Abnormal; Notable for the following:    WBC 20.4 (*)    Hemoglobin 15.6 (*)    HCT 46.1 (*)    Platelets 417 (*)    Neutro Abs 13.6 (*)    Lymphs Abs 4.4 (*)    Monocytes Absolute 2.1 (*)    All other components within normal limits  URINALYSIS, ROUTINE W REFLEX MICROSCOPIC (NOT AT [facility]) - Abnormal; Notable for the following:    Color, Urine AMBER (*)    APPearance CLOUDY (*)    Bilirubin Urine MODERATE (*)    Ketones, ur 15 (*)    Protein, ur 30 (*)    Leukocytes, UA TRACE (*)    All other components within normal limits  URINE MICROSCOPIC-ADD ON -  Abnormal; Notable for the following:    Squamous Epithelial / LPF FEW (*)    Bacteria, UA MANY (*)    Casts HYALINE CASTS (*)    Crystals CA OXALATE CRYSTALS (*)    All other components within normal limits  I-STAT CG4 LACTIC ACID, ED - Abnormal; Notable for the following:    Lactic Acid, Venous 2.40 (*)    All other components within normal limits  I-STAT ARTERIAL BLOOD GAS, ED - Abnormal; Notable for the following:    pH, Arterial 7.051 (*)    pCO2 arterial 67.5 (*)    pO2, Arterial 240.0 (*)    Bicarbonate 18.0 (*)    Acid-base deficit 12.0 (*)    All other components within normal limits  CULTURE, BLOOD (ROUTINE X 2)  CULTURE, BLOOD (ROUTINE X 2)  URINE CULTURE  CULTURE, RESPIRATORY (NON-EXPECTORATED)  BRAIN NATRIURETIC PEPTIDE  HEPATIC FUNCTION PANEL  BASIC METABOLIC PANEL  MAGNESIUM  PHOSPHORUS  TRIGLYCERIDES  STREP PNEUMONIAE URINARY ANTIGEN  LEGIONELLA PNEUMOPHILA SEROGP 1 UR AG  INFLUENZA PANEL BY PCR (TYPE A & B, H1N1)  TSH  PROCALCITONIN  PROCALCITONIN  I-STAT TROPOININ, ED  I-STAT CG4 LACTIC ACID, ED   Imaging Review Dg Chest Portable 1 View  08/10/2015  CLINICAL DATA:  69 year old female with a history of recent intubation. EXAM: PORTABLE CHEST 1 VIEW COMPARISON:  Chest x-ray 12/07/2014, chest CT 12/15/2014 FINDINGS: Cardiomediastinal silhouette unchanged in size and contour. No evidence of pulmonary vascular congestion. Atherosclerosis of the aortic arch. Low lung volumes. Coarsened interstitial markings. No pneumothorax or pleural effusion.  No confluent airspace disease. Endotracheal tube terminates at the origin of the right mainstem bronchus. This may be withdrawn 3 cm - 7 cm for better position. Gastric tube terminates out of the field of view. IMPRESSION: Endotracheal tube terminates at the origin of the right mainstem bronchus. This may be withdrawn 3 cm - 7 cm for better position. Low lung volumes with coarsened interstitial markings and no evidence of  lobar pneumonia. Gastric tube terminates out of the field of view. These results were called by telephone at the time of interpretation on 07/21/2015 at 9:29 pm to Dr. Littie Deeds, who verbally acknowledged these results. Signed, Yvone Neu. Loreta Ave,  DO [facility] Radiology Electronically Signed   By: Gilmer Mor D.O.   On: 08/13/2015 21:31   I have personally reviewed and evaluated these images and lab results as part of my medical decision-making.   EKG Interpretation None      MDM   Patient presents emergency department today in severe respiratory distress via EMS. According to EMS patient has been progressively become short of breath with COPD exacerbation past few days. Husband is that she was extremely lethargic today, somnolent and tachycardic. He found her living room in severe respiratory distress and called 911. EMS gave Solu-Medrol, breathing treatments and placed on an artery. Upon arrival she was somnolent, and respiratory distress and unable to answer questions. Due to her impaired pending respiratory failure patient was intubated with RSI.   Patient found to have fever 103.2. She start on empiric antibiotics. Placed on propofol drip and admitted to ICU for further management. At this time we believe her leukocytosis and fever to represent infection however pneumonia is not found on x-ray.AKI found. Possible viral upper respiratory infection exacerbating her COPD is most likely.   Final diagnoses:  Sepsis, due to unspecified organism (HCC)  Lactic acidosis  AKI (acute kidney injury) (HCC)  Acute respiratory failure, unspecified whether with hypoxia or hypercapnia (HCC)     Deirdre Peer, MD 08/02/15 1610  Mirian Mo, MD 08/02/15 [phone]

## 2015-08-01 NOTE — H&P (Signed)
PULMONARY / CRITICAL CARE MEDICINE   Name: [name] MRN: [id number] DOB: [birth year]    ADMISSION DATE:  2015/08/18  REFERRING MD :  ER  CHIEF COMPLAINT:  Altered mental status  INITIAL PRESENTATION:  69 yo former smoker with altered mental status for 1 day from AKI, sepsis, acute on chronic hypoxic/hypercapnic respiratory failure.  Has hx of COPD and followed by Dr. Sherene Sires in pulmonary office.  STUDIES:  PFT 06/07/13 >> FEV1 1.38 (65%), FEV1% 50, TLC 5.45 (118%), DLCO 49%, no BD  SIGNIFICANT EVENTS: 10/17 Admit   HISTORY OF PRESENT ILLNESS:   Hx from pt's husband, ER staff, and chart.  She has hx of COPD.  She has chronic cough which is occasionally productive of clear sputum.  She was seen by PCP and started on boniva.  Around this time she developed more cough.  The boniva was stopped, but her cough persisted.  She was also having trouble with constipation.  She was taking laxatives.  Her husband said she eventually had a BM earlier today, but seemed much more labored with her breathing.  She was not eating or drinking much either, and he doesn't think she took any of her medications today.  Her pulse ox at home was in the low 80s, and she was difficult to arouse.  Her husband therefore called EMS and she was brought to the ER.  She was intubated.  She was noted to have a mildly elevated lactic acid level and fever (Tm 103.2).  She was started on Abx, and IV fluid.  Her labs showed leukocytosis, metabolic acidosis, and AKI.  She has hx of alcohol abuse, but her husband reports she has not drank since Spring of 2015.  PAST MEDICAL HISTORY :  She  has a past medical history of COPD (chronic obstructive pulmonary disease); Anxiety; Hypertension; Allergic rhinitis; Goiter; Asthma; Shingles; Pneumonia (2014); GERD (gastroesophageal reflux disease); Arthritis; Chronic lower back pain; On home oxygen therapy; and Postherpetic neuralgia.  PAST SURGICAL HISTORY: She  has past surgical  history that includes Tubal ligation; Tonsillectomy; Wisdom tooth extraction; Thyroidectomy (12/29/08); and Cataract extraction w/ intraocular lens  implant, bilateral (Bilateral).   Prior to Admission medications   Medication Sig Start Date End Date Taking? Authorizing Provider  acetaminophen (TYLENOL) 500 MG tablet Per bottle as needed for pain/headache    Historical Provider, MD  albuterol (PROAIR HFA) 108 (90 BASE) MCG/ACT inhaler INHALE 2 PUFFS INTO LUNGS EVERY 4 HOURS AS NEEDED WHEEZING 08-18-2015   Nyoka Cowden, MD  albuterol (PROVENTIL) (2.5 MG/3ML) 0.083% nebulizer solution Take 3 mLs (2.5 mg total) by nebulization every 4 (four) hours as needed for wheezing or shortness of breath. 12/07/14   Tammy S Parrett, NP  B Complex-C (B-COMPLEX WITH VITAMIN C) tablet Take 1 tablet by mouth every morning.    Historical Provider, MD  Calcium Carbonate-Vitamin D (CALCIUM-VITAMIN D) 500-200 MG-UNIT per tablet Take 1 tablet by mouth every morning.     Historical Provider, MD  cetirizine (ZYRTEC) 10 MG tablet Take 10 mg by mouth at bedtime as needed (for nasal drainage).     Historical Provider, MD  cholecalciferol (VITAMIN D) 1000 UNITS tablet Take 1,000 Units by mouth daily.     Historical Provider, MD  dextromethorphan-guaiFENesin (MUCINEX DM) 30-600 MG per 12 hr tablet Take 1-2 tablets by mouth 2 (two) times daily as needed (w/ flutter valve for cough).     Historical Provider, MD  diazepam (VALIUM) 5 MG tablet TAKE 1 TABLET BY MOUTH  EVERY 6 HOURS AS NEEDED FOR ANXIETY 07/18/15   Nyoka Cowden, MD  famotidine (PEPCID) 20 MG tablet Take 20 mg by mouth at bedtime.    Historical Provider, MD  OXYGEN Inhale into the lungs. Wear 2L of O2 as needed for increased shortness of breath with activity and at bedtime    Historical Provider, MD  polyethylene glycol (MIRALAX / GLYCOLAX) packet Take 17 g by mouth daily as needed (constipation).     Historical Provider, MD  PROAIR HFA 108 (90 BASE) MCG/ACT inhaler  INHALE 2 PUFFS INTO LUNGS EVERY 4 HOURS AS NEEDED WHEEZING Patient taking differently: INHALE 2 PUFFS INTO LUNGS EVERY 4-6 HOURS AS NEEDED WHEEZING/SHORTNESS OF BREATH (( PLAN B)) 05/12/15   Nyoka Cowden, MD  Respiratory Therapy Supplies (FLUTTER) DEVI Use as directed 08/20/14   Nyoka Cowden, MD  SYMBICORT 160-4.5 MCG/ACT inhaler INHALE 2 PUFFS INTO THE LUNGS 2 (TWO) TIMES DAILY. 05/30/15   Nyoka Cowden, MD  Tiotropium Bromide Monohydrate (SPIRIVA RESPIMAT) 2.5 MCG/ACT AERS 2 each am Patient taking differently: Take 2 puffs each morning ((PLAN A)) 11/09/14   Nyoka Cowden, MD  verapamil (VERELAN PM) 180 MG 24 hr capsule TAKE 1 CAPSULE (180 MG TOTAL) BY MOUTH AT BEDTIME. 06/08/15   Tresa Garter, MD   Allergies  Allergen Reactions  . Boniva [Ibandronic Acid]     Cough, achy  . Tramadol     memory issues    FAMILY HISTORY:  Her family history includes Hypertension in her father.  SOCIAL HISTORY: She  reports that she quit smoking about 10 years ago. Her smoking use included Cigarettes. She has a 40 pack-year smoking history. She has never used smokeless tobacco. She reports that she drinks about 37.8 oz of alcohol per week. She reports that she does not use illicit drugs.   REVIEW OF SYSTEMS:   Unable to obtain.  SUBJECTIVE:   VITAL SIGNS: Temp:  [99.3 F (37.4 C)-103.2 F (39.6 C)] 99.3 F (37.4 C) (10/17 2300) Pulse Rate:  [100-124] 100 (10/17 2300) Resp:  [17-39] 24 (10/17 2300) BP: (85-167)/(41-133) 115/57 mmHg (10/17 2300) SpO2:  [97 %-99 %] 99 % (10/17 2300) FiO2 (%):  [50 %-70 %] 50 % (10/17 2250) Weight:  [132 lb 4.4 oz (60 kg)] 132 lb 4.4 oz (60 kg) (10/17 2219) HEMODYNAMICS:   VENTILATOR SETTINGS: Vent Mode:  [-] PRVC FiO2 (%):  [50 %-70 %] 50 % Set Rate:  [16 bmp-24 bmp] 24 bmp Vt Set:  [440 mL] 440 mL PEEP:  [5 cmH20] 5 cmH20 Plateau Pressure:  [13 cmH20] 13 cmH20 INTAKE / OUTPUT:  Intake/Output Summary (Last 24 hours) at 07/31/2015 2332 Last data  filed at 08/09/2015 2238  Gross per 24 hour  Intake   2000 ml  Output      0 ml  Net   2000 ml    PHYSICAL EXAMINATION: General: seen in ER Neuro:  RASS -3 HEENT:  Pupils reactive, ETT in place, surgical scar over anterior neck area Cardiovascular:  Regular, tachycardic, no murmur Lungs:  Decreased BS, no wheezing Abdomen:  Soft, non tender, decreased BS Musculoskeletal:  No edema Skin:  No rashes  LABS:  CBC  Recent Labs Lab 07/31/2015 2045  WBC 20.4*  HGB 15.6*  HCT 46.1*  PLT 417*   Coag's No results for input(s): APTT, INR in the last 168 hours.   BMET  Recent Labs Lab 07/23/2015 2045  NA 134*  K 3.7  CL 105  CO2 13*  BUN 26*  CREATININE 2.24*  GLUCOSE 118*   Electrolytes  Recent Labs Lab 08/06/2015 2045  CALCIUM 8.9   Sepsis Markers  Recent Labs Lab 07/17/2015 2131  LATICACIDVEN 2.40*   ABG  Recent Labs Lab 07/31/2015 2149  PHART 7.051*  PCO2ART 67.5*  PO2ART 240.0*   Liver Enzymes No results for input(s): AST, ALT, ALKPHOS, BILITOT, ALBUMIN in the last 168 hours.   Cardiac Enzymes No results for input(s): TROPONINI, PROBNP in the last 168 hours.   Glucose No results for input(s): GLUCAP in the last 168 hours.  Imaging Dg Chest Portable 1 View  08/04/2015  CLINICAL DATA:  69 year old female with a history of recent intubation. EXAM: PORTABLE CHEST 1 VIEW COMPARISON:  Chest x-ray 12/07/2014, chest CT 12/15/2014 FINDINGS: Cardiomediastinal silhouette unchanged in size and contour. No evidence of pulmonary vascular congestion. Atherosclerosis of the aortic arch. Low lung volumes. Coarsened interstitial markings. No pneumothorax or pleural effusion.  No confluent airspace disease. Endotracheal tube terminates at the origin of the right mainstem bronchus. This may be withdrawn 3 cm - 7 cm for better position. Gastric tube terminates out of the field of view. IMPRESSION: Endotracheal tube terminates at the origin of the right mainstem bronchus. This  may be withdrawn 3 cm - 7 cm for better position. Low lung volumes with coarsened interstitial markings and no evidence of lobar pneumonia. Gastric tube terminates out of the field of view. These results were called by telephone at the time of interpretation on 08/02/2015 at 9:29 pm to Dr. Littie DeedsGentry, who verbally acknowledged these results. Signed, Yvone NeuJaime S. Loreta AveWagner, DO [facility] Electronically Signed   By: Gilmer MorJaime  Wagner D.O.   On: 08/10/2015 21:31     ASSESSMENT / PLAN:  PULMONARY ETT 10/17 >> A: Acute on chronic respiratory failure in setting of sepsis, AKI, and metabolic acidosis. 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 P:   Full vent support Adjust vent settings to avoid PEEPi Scheduled duoneb with prn albuterol Solumedrol 20 mg bid F/u CXR, ABG  CARDIOVASCULAR A:  Hx of HTN. P:  Hold outpt verapamil  RENAL A:   AKI in setting of sepsis and hypovolemia >> baseline creatinine 1.08 from 03/11/15. Lactic acidosis. P:   Foley Continue IV fluids Monitor renal fx, urine outpt F/u lactic acid  GASTROINTESTINAL A:   Constipation. Nutrition. P:   Tube feeds Protonix for SUP F/u Abd xray Bowel regimen  HEMATOLOGIC A:   Leukocytosis. P:  F/u CBC SQ heparin for DVT prevention  INFECTIOUS A:   Sepsis >> likely respiratory source. P:   Day 1 of abx, currently on rocephin, zithromax  Blood 10/17 >> Sputum 10/17 >> Urine 10/17 >> Legionella Ag 10/17 >> Pneumococcal Ag 10/17 >> Influenza PCR 10/17 >>  ENDOCRINE A:   Hx of Goiter. P:   Check TSH  NEUROLOGIC A:   Acute encephalopathy 2nd to sepsis, respiratory failure, AKI. P:   RASS goal: -1 Propofol with prn fentanyl  Updated pt's husband at bedside.  CC time 44 minutes.  Coralyn HellingVineet Antawn Sison, MD [facility] 07/21/2015, 11:50 PM Pager:  [phone] After 3pm call: [phone]

## 2015-08-01 NOTE — Progress Notes (Signed)
Chaplain was paged to provide support for pt's husband as needed. I accompanied ED physician to consult room and was present as he discussed case with pt's husband. I then led pt's husband to bedside in Trauma B, standing with him, providing emotional support and sharing conversation. Husband expressed appreciation for chaplain support.

## 2015-08-01 NOTE — ED Notes (Signed)
Code sepsis activated @ 21:55pm

## 2015-08-01 NOTE — Progress Notes (Signed)
   07/16/2015 2155  Adult Ventilator Settings  Vent Type Servo i  Humidity HME  Vent Mode PRVC  Vt Set 440 mL  Set Rate 24 bmp  FiO2 (%) 50 %  I Time 0.8 Sec(s)  PEEP 5 cmH20  Adult Ventilator Measurements  Resp Rate Spontaneous 0 br/min  Resp Rate Total 24 br/min  Exhaled Vt 435 mL  Measured Ve 10.3 mL  I:E Ratio Measured 1:2.1  Increase RR to 24 and decrease Fio2 to 50% post ABG result.

## 2015-08-02 ENCOUNTER — Inpatient Hospital Stay (HOSPITAL_COMMUNITY): Payer: Medicare Other

## 2015-08-02 DIAGNOSIS — L8990 Pressure ulcer of unspecified site, unspecified stage: Secondary | ICD-10-CM | POA: Insufficient documentation

## 2015-08-02 LAB — MRSA PCR SCREENING: MRSA by PCR: NEGATIVE

## 2015-08-02 LAB — GLUCOSE, CAPILLARY
GLUCOSE-CAPILLARY: 185 mg/dL — AB (ref 65–99)
GLUCOSE-CAPILLARY: 164 mg/dL — AB (ref 65–99)
GLUCOSE-CAPILLARY: 160 mg/dL — AB (ref 65–99)
Glucose-Capillary: 147 mg/dL — ABNORMAL HIGH (ref 65–99)

## 2015-08-02 LAB — BLOOD GAS, ARTERIAL
ACID-BASE DEFICIT: 12.7 mmol/L — AB (ref 0.0–2.0)
Acid-base deficit: 12.8 mmol/L — ABNORMAL HIGH (ref 0.0–2.0)
Bicarbonate: 14.3 mEq/L — ABNORMAL LOW (ref 20.0–24.0)
Bicarbonate: 13.5 mEq/L — ABNORMAL LOW (ref 20.0–24.0)
DRAWN BY: 419771
DRAWN BY: 419771
FIO2: 0.4
FIO2: 0.4
MECHVT: 440 mL
MECHVT: 440 mL
O2 SAT: 96.9 %
O2 SAT: 97.8 %
PATIENT TEMPERATURE: 98.6
PCO2 ART: 34.8 mmHg — AB (ref 35.0–45.0)
PEEP/CPAP: 5 cmH2O
PEEP: 5 cmH2O
PH ART: 7.162 — AB (ref 7.350–7.450)
PH ART: 7.212 — AB (ref 7.350–7.450)
PO2 ART: 110 mmHg — AB (ref 80.0–100.0)
Patient temperature: 98.6
RATE: 18 resp/min
RATE: 28 resp/min
TCO2: 15.6 mmol/L (ref 0–100)
TCO2: 14.5 mmol/L (ref 0–100)
pCO2 arterial: 41.6 mmHg (ref 35.0–45.0)
pO2, Arterial: 101 mmHg — ABNORMAL HIGH (ref 80.0–100.0)

## 2015-08-02 LAB — BASIC METABOLIC PANEL
Anion gap: 12 (ref 5–15)
Anion gap: 10 (ref 5–15)
BUN: 25 mg/dL — AB (ref 6–20)
BUN: 24 mg/dL — AB (ref 6–20)
CHLORIDE: 110 mmol/L (ref 101–111)
CHLORIDE: 110 mmol/L (ref 101–111)
CO2: 18 mmol/L — ABNORMAL LOW (ref 22–32)
CO2: 19 mmol/L — ABNORMAL LOW (ref 22–32)
CREATININE: 1.58 mg/dL — AB (ref 0.44–1.00)
Calcium: 7.8 mg/dL — ABNORMAL LOW (ref 8.9–10.3)
Calcium: 8 mg/dL — ABNORMAL LOW (ref 8.9–10.3)
Creatinine, Ser: 2.04 mg/dL — ABNORMAL HIGH (ref 0.44–1.00)
GFR calc Af Amer: 28 mL/min — ABNORMAL LOW (ref >60)
GFR calc Af Amer: 38 mL/min — ABNORMAL LOW (ref >60)
GFR calc non Af Amer: 24 mL/min — ABNORMAL LOW (ref >60)
GFR calc non Af Amer: 33 mL/min — ABNORMAL LOW (ref >60)
GLUCOSE: 143 mg/dL — AB (ref 65–99)
GLUCOSE: 151 mg/dL — AB (ref 65–99)
POTASSIUM: 4.3 mmol/L (ref 3.5–5.1)
Potassium: 3.7 mmol/L (ref 3.5–5.1)
SODIUM: 139 mmol/L (ref 135–145)
Sodium: 140 mmol/L (ref 135–145)

## 2015-08-02 LAB — INFLUENZA PANEL BY PCR (TYPE A & B)
H1N1 flu by pcr: NOT DETECTED
INFLBPCR: NEGATIVE
Influenza A By PCR: NEGATIVE

## 2015-08-02 LAB — HEPATIC FUNCTION PANEL
ALT: 15 U/L (ref 14–54)
AST: 24 U/L (ref 15–41)
Albumin: 3.1 g/dL — ABNORMAL LOW (ref 3.5–5.0)
Alkaline Phosphatase: 74 U/L (ref 38–126)
BILIRUBIN DIRECT: 0.8 mg/dL — AB (ref 0.1–0.5)
BILIRUBIN INDIRECT: 0.9 mg/dL (ref 0.3–0.9)
Total Bilirubin: 1.7 mg/dL — ABNORMAL HIGH (ref 0.3–1.2)
Total Protein: 6.2 g/dL — ABNORMAL LOW (ref 6.5–8.1)

## 2015-08-02 LAB — TSH: TSH: 0.572 u[IU]/mL (ref 0.350–4.500)

## 2015-08-02 LAB — LACTIC ACID, PLASMA
Lactic Acid, Venous: 0.7 mmol/L (ref 0.5–2.0)
Lactic Acid, Venous: 1.8 mmol/L (ref 0.5–2.0)

## 2015-08-02 LAB — URINE CULTURE: Culture: NO GROWTH

## 2015-08-02 LAB — STREP PNEUMONIAE URINARY ANTIGEN: Strep Pneumo Urinary Antigen: NEGATIVE

## 2015-08-02 LAB — PROCALCITONIN: PROCALCITONIN: 12.4 ng/mL

## 2015-08-02 LAB — PHOSPHORUS: Phosphorus: 6.6 mg/dL — ABNORMAL HIGH (ref 2.5–4.6)

## 2015-08-02 LAB — TROPONIN I
Troponin I: 0.12 ng/mL — ABNORMAL HIGH (ref <0.031)
Troponin I: <0.03 ng/mL (ref <0.031)

## 2015-08-02 LAB — MAGNESIUM: Magnesium: 3 mg/dL — ABNORMAL HIGH (ref 1.7–2.4)

## 2015-08-02 LAB — TRIGLYCERIDES: TRIGLYCERIDES: 221 mg/dL — AB (ref <150)

## 2015-08-02 MED ORDER — VITAL AF 1.2 CAL PO LIQD
1000.0000 mL | ORAL | Status: DC
  Administered 2015-08-03 – 2015-08-08 (×5): 1000 mL
  Filled 2015-08-02 (×13): qty 1000

## 2015-08-02 NOTE — Progress Notes (Signed)
PULMONARY / CRITICAL CARE MEDICINE   Name: Mailani Degroote MRN: [id number] DOB: 22-May-1946    ADMISSION DATE:  08/04/2015 CONSULTATION DATE:  08/05/2015  REFERRING MD :  EDP  CHIEF COMPLAINT:  Altered Mental Status  INITIAL PRESENTATION: 69 yo former smoker with altered mental status for 1 day from AKI, sepsis, acute on chronic hypoxic/hypercapnic respiratory failure. Has hx of COPD and followed by Dr. Sherene Sires in pulmonary office.  STUDIES:  PFT 06/07/13 >> FEV1 1.38 (65%), FEV1% 50, TLC 5.45 (118%), DLCO 49%, no BD  SIGNIFICANT EVENTS: 10/17 admit  SUBJECTIVE:  NAEON Febrile to 103.2  VITAL SIGNS: Temp:  [98.4 F (36.9 C)-103.2 F (39.6 C)] 98.4 F (36.9 C) (10/18 0335) Pulse Rate:  [85-124] 85 (10/18 0700) Resp:  [17-39] 28 (10/18 0700) BP: (85-167)/(41-133) 135/79 mmHg (10/18 0700) SpO2:  [92 %-100 %] 100 % (10/18 0700) FiO2 (%):  [40 %-70 %] 40 % (10/18 0411) Weight:  [132 lb 4.4 oz (60 kg)-134 lb 11.2 oz (61.1 kg)] 134 lb 11.2 oz (61.1 kg) (10/18 0200) HEMODYNAMICS:   VENTILATOR SETTINGS: Vent Mode:  [-] PRVC FiO2 (%):  [40 %-70 %] 40 % Set Rate:  [16 bmp-24 bmp] 24 bmp Vt Set:  [440 mL] 440 mL PEEP:  [5 cmH20] 5 cmH20 Plateau Pressure:  [13 cmH20-15 cmH20] 15 cmH20 INTAKE / OUTPUT:  Intake/Output Summary (Last 24 hours) at 08/02/15 0801 Last data filed at 08/02/15 0700  Gross per 24 hour  Intake 3252.1 ml  Output    465 ml  Net 2787.1 ml    PHYSICAL EXAMINATION: General: NAD Neuro: RASS -2 HEENT: Pupils reactive, ETT in place, surgical scar over anterior neck area Cardiovascular: RRR, no murmur Lungs: Decreased BS, no wheezing Abdomen: Soft, non tender, decreased BS Musculoskeletal: No edema Skin: No rashes  LABS:  CBC  Recent Labs Lab 07/30/2015 2045  WBC 20.4*  HGB 15.6*  HCT 46.1*  PLT 417*   Coag's No results for input(s): APTT, INR in the last 168 hours. BMET  Recent Labs Lab 07/30/2015 2045 08/02/15 0104  NA 134* 140  K 3.7  4.3  CL 105 110  CO2 13* 18*  BUN 26* 25*  CREATININE 2.24* 2.04*  GLUCOSE 118* 143*   Electrolytes  Recent Labs Lab 08/13/2015 2045 08/02/15 0104  CALCIUM 8.9 7.8*  MG  --  3.0*  PHOS  --  6.6*   Sepsis Markers  Recent Labs Lab 08/12/2015 2131 08/02/15 0104 08/02/15 0230  LATICACIDVEN 2.40*  --  0.7  PROCALCITON  --  12.40  --    ABG  Recent Labs Lab 07/18/2015 2149 08/02/15 0326 08/02/15 0506  PHART 7.051* 7.162* 7.212*  PCO2ART 67.5* 41.6 34.8*  PO2ART 240.0* 101* 110*   Liver Enzymes  Recent Labs Lab 08/02/15 0104  AST 24  ALT 15  ALKPHOS 74  BILITOT 1.7*  ALBUMIN 3.1*   Cardiac Enzymes No results for input(s): TROPONINI, PROBNP in the last 168 hours. Glucose No results for input(s): GLUCAP in the last 168 hours.  Imaging Dg Chest Port 1 View  08/02/2015  CLINICAL DATA:  Intubation. EXAM: PORTABLE CHEST 1 VIEW COMPARISON:  Yesterday at 2104 hour FINDINGS: The endotracheal tube is been retracted, now 2.2 cm in the carina. Enteric tube in place, tip below the diaphragm not included in the field of view. Heart size and mediastinal contours are unchanged. Mild bibasilar atelectasis, increased from prior. Underlying emphysematous change. No pulmonary edema, pleural effusion or pneumothorax. Kyphoplasty noted in the spine. IMPRESSION:  1. Endotracheal tube now 2.2 cm from the carina. 2. Increasing bibasilar atelectasis. Electronically Signed   By: Rubye OaksMelanie  Ehinger M.D.   On: 08/02/2015 01:53   Dg Chest Portable 1 View  08/06/2015  CLINICAL DATA:  69 year old female with a history of recent intubation. EXAM: PORTABLE CHEST 1 VIEW COMPARISON:  Chest x-ray 12/07/2014, chest CT 12/15/2014 FINDINGS: Cardiomediastinal silhouette unchanged in size and contour. No evidence of pulmonary vascular congestion. Atherosclerosis of the aortic arch. Low lung volumes. Coarsened interstitial markings. No pneumothorax or pleural effusion.  No confluent airspace disease. Endotracheal  tube terminates at the origin of the right mainstem bronchus. This may be withdrawn 3 cm - 7 cm for better position. Gastric tube terminates out of the field of view. IMPRESSION: Endotracheal tube terminates at the origin of the right mainstem bronchus. This may be withdrawn 3 cm - 7 cm for better position. Low lung volumes with coarsened interstitial markings and no evidence of lobar pneumonia. Gastric tube terminates out of the field of view. These results were called by telephone at the time of interpretation on 07/20/2015 at 9:29 pm to Dr. Littie DeedsGentry, who verbally acknowledged these results. Signed, Yvone NeuJaime S. Loreta AveWagner, DO [facility] Electronically Signed   By: Gilmer MorJaime  Wagner D.O.   On: 07/30/2015 21:31   Dg Abd Portable 1v  08/02/2015  CLINICAL DATA:  Constipation and OG tube placement EXAM: PORTABLE ABDOMEN - 1 VIEW COMPARISON:  None. FINDINGS: Enteric tube in place, tip and side-port in the stomach. Air-filled loops of small bowel in the central abdomen, prominent but not dilated. Small volume formed stool in the colon. No evidence of free air. Kyphoplasty noted in the thoracic spine. IMPRESSION: 1. Tip and side port of the enteric tube in the stomach. 2. Air-filled prominent small bowel in the central and lower abdomen, suspect ileus. Continued radiographic follow-up could be considered to exclude developing obstruction. Small volume form stool is seen in the colon. Electronically Signed   By: Rubye OaksMelanie  Ehinger M.D.   On: 08/02/2015 01:55     ASSESSMENT / PLAN:  PULMONARY ETT 10/17 >> A: Acute on chronic respiratory failure in setting of sepsis, AKI, and metabolic acidosis. 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 P:  Full vent support Scheduled duoneb with prn albuterol Solumedrol 20 mg bid ABG with persistent acidosis - MV adjusted Repeat ABG CXR in AM  CARDIOVASCULAR A:  Hx of HTN. P:   Hold outpt verapamil  RENAL A:  AKI in setting of sepsis and hypovolemia >> baseline creatinine 1.08 from 03/11/15. - Cr improving Lactic acidosis. - resolved P:  Foley Continue IV fluids Monitor renal fx, urine outpt  GASTROINTESTINAL A:  Constipation. Nutrition. P:  Tube feeds Protonix for SUP F/u Abd xray Bowel regimen  HEMATOLOGIC A:  Leukocytosis. P:  F/u CBC SQ heparin for DVT prevention  INFECTIOUS A:  Sepsis >> unclear source. - CXR with atelectasis P:  CTX 10/17 >> Azithro 10/17 >>  Blood 10/17 >> Sputum 10/17 >> Urine 10/17 >> Legionella Ag 10/17 >> Pneumococcal Ag 10/17 >> Influenza PCR 10/17 >> neg  ENDOCRINE A:  Hx of Goiter. - TSH wnl P:  Follow CBGs  NEUROLOGIC A:  Acute encephalopathy 2nd to sepsis, respiratory failure, AKI. P:  RASS goal: -1 Propofol with prn fentanyl  FAMILY  - Updates: no family at bedside this AM - will attempt to call later today - Inter-disciplinary family meet or Palliative  Care meeting due by:  day 7   Erasmo Downer, MD, MPH PGY-2,  [facility] 08/02/2015 8:01 AM

## 2015-08-02 NOTE — Progress Notes (Signed)
eLink Physician-Brief Progress Note Patient Name: Brandon MelnickMelody Mcconathy DOB: [birth year] MRN: [id number]   Date of Service  08/02/2015  HPI/Events of Note  Persistent acidosis on ABG. LA resolved.  eICU Interventions  Increase RR to 28 to increase MV to approximately 12.5 for PCO2 reduction.     Intervention Category Major Interventions: Respiratory failure - evaluation and management  Lawanda CousinsJennings Ancel Easler 08/02/2015, 4:11 AM

## 2015-08-02 NOTE — Progress Notes (Signed)
CRITICAL VALUE ALERT  Critical value received:  Blood Gas PH 7.16 CO2 41.6 PO2 101 Bicarb 14.3  Date of notification:  08-02-15  Time of notification:  0340  Critical value read back:Yes  Nurse who received alert:  Eddie NorthJennifer Ondria Oswald, RN  MD notified (1st page):  Dr. Jimmey RalphParker  Time of first page:  0340  MD notified (2nd page):  Time of second page:  Responding MD:  Dr. Jimmey RalphParker  Time MD responded:  [phone]

## 2015-08-02 NOTE — Progress Notes (Signed)
Initial Nutrition Assessment  DOCUMENTATION CODES:   Not applicable  INTERVENTION:  - Modify tube feeding order to Vital 1.2 @ 60 mL/hr (1440 mL / day).    With patient's current propofol order of 11 mL/hr (290.4 kcal/day); tube feeding + propofol regime provides; 2018 kcal (100%), 108 gm protein (100%), and 1168 mL free H20.   - RD team to continue to monitor for needs.    NUTRITION DIAGNOSIS:   Inadequate oral intake related to inability to eat as evidenced by NPO status.    GOAL:   Patient will meet greater than or equal to 90% of their needs    MONITOR:   Vent status, Labs, Weight trends, TF tolerance, Skin, I & O's, Diet advancement  REASON FOR ASSESSMENT:   Consult, Ventilator Enteral/tube feeding initiation and management  ASSESSMENT:   69 yo former smoker with altered mental 21status69 yo former smoker with altered mental status for 1 day from AKI, sepsis, acute on chronic hypoxic/hypercapnic respiratory failure. Has hx of COPD and followed by Dr. Sherene SiresWert in pulmonary office. for 1 day from AKI, sepsis, acute on chronic hypoxic/hypercapnic respiratory failure. Has hx of COPD and followed by Dr. Sherene SiresWert in pulmonary office.  Patient sedated in room with no family present at time of visit tube feeding consult. No historical data was able to be collected at this time.   Per nurse, patient's tube feeding was turned off at 0441 due to risk for potential ileus in the small bowel, but care plan has changed and feeding will be resumed today.   Patient is currently on Vital High Protein @ 30 mL/hr, progressing towards goal of 40 mL/hr. Patient's needs calculated using the [facility] 2003(b) equation: 1987 kcal. Recommend modifying tube feeding order to meet needs.   NFPE performed: mild muscle wasting - thighs, knee, calf.   Labs reviewed: PH 7.16, CO2 41.6, PO2 101, BiCarb 14.3, CBG (165-188), Bilirubin 1.7, BUN 125, Cr 2.04  Medications reviewed.   Patient is currently  intubated on ventilator support: Temp (24hrs), Avg:99.9 F (37.7 C), Min:98 F (36.7 C), Max:103.2 F (39.6 C) VM: 11.2 Propofol: 11 mL/hr  Diet Order:  Diet NPO time specified  Skin:  Wound (see comment) (Pressure ulcer, stage 1, sacrum)  Last BM:  07/27/2015  Height:   Ht Readings from Last 1 Encounters:  08/02/15 5\' 4"  (1.626 m)    Weight:   Wt Readings from Last 1 Encounters:  08/02/15 134 lb 11.2 oz (61.1 kg)    Ideal Body Weight:  54.5 kg  BMI:  Body mass index is 23.11 kg/(m^2).  Estimated Nutritional Needs:   Kcal:  1987 kcal  Protein:  100-110 gm  Fluid:  2 L/day  EDUCATION NEEDS:   No education needs identified at this time  Delano MetzMaggie Abdoulaye Drum, Dietetic Intern 08/02/2015 3:29 PM

## 2015-08-02 NOTE — Progress Notes (Signed)
eLink Physician-Brief Progress Note Patient Name: Laura MelnickMelody Rana DOB: 02-May-1946 MRN: [id number]   Date of Service  08/02/2015  HPI/Events of Note  LA elevated at 2.4  eICU Interventions  Trending LA q6hr.     Intervention Category Major Interventions: Sepsis - evaluation and management  Lawanda CousinsJennings Keyuna Cuthrell 08/02/2015, 1:55 AM

## 2015-08-03 ENCOUNTER — Inpatient Hospital Stay (HOSPITAL_COMMUNITY): Payer: Medicare Other

## 2015-08-03 LAB — BLOOD GAS, ARTERIAL
Acid-base deficit: 5.7 mmol/L — ABNORMAL HIGH (ref 0.0–2.0)
Bicarbonate: 19.1 mEq/L — ABNORMAL LOW (ref 20.0–24.0)
DRAWN BY: 44166
FIO2: 0.4
MECHVT: 440 mL
O2 Saturation: 96.1 %
PEEP: 5 cmH2O
PO2 ART: 80.8 mmHg (ref 80.0–100.0)
Patient temperature: 98.6
RATE: 28 resp/min
TCO2: 20.3 mmol/L (ref 0–100)
pCO2 arterial: 37 mmHg (ref 35.0–45.0)
pH, Arterial: 7.334 — ABNORMAL LOW (ref 7.350–7.450)

## 2015-08-03 LAB — BASIC METABOLIC PANEL
ANION GAP: 11 (ref 5–15)
BUN: 23 mg/dL — ABNORMAL HIGH (ref 6–20)
CO2: 21 mmol/L — AB (ref 22–32)
Calcium: 8.2 mg/dL — ABNORMAL LOW (ref 8.9–10.3)
Chloride: 108 mmol/L (ref 101–111)
Creatinine, Ser: 1.47 mg/dL — ABNORMAL HIGH (ref 0.44–1.00)
GFR, EST AFRICAN AMERICAN: 41 mL/min — AB (ref >60)
GFR, EST NON AFRICAN AMERICAN: 35 mL/min — AB (ref >60)
GLUCOSE: 186 mg/dL — AB (ref 65–99)
POTASSIUM: 3.1 mmol/L — AB (ref 3.5–5.1)
Sodium: 140 mmol/L (ref 135–145)

## 2015-08-03 LAB — GLUCOSE, CAPILLARY
GLUCOSE-CAPILLARY: 205 mg/dL — AB (ref 65–99)
Glucose-Capillary: 165 mg/dL — ABNORMAL HIGH (ref 65–99)
Glucose-Capillary: 171 mg/dL — ABNORMAL HIGH (ref 65–99)
Glucose-Capillary: 183 mg/dL — ABNORMAL HIGH (ref 65–99)

## 2015-08-03 LAB — MAGNESIUM: Magnesium: 2.4 mg/dL (ref 1.7–2.4)

## 2015-08-03 LAB — CBC
HEMATOCRIT: 30.7 % — AB (ref 36.0–46.0)
Hemoglobin: 10.3 g/dL — ABNORMAL LOW (ref 12.0–15.0)
MCH: 30 pg (ref 26.0–34.0)
MCHC: 33.6 g/dL (ref 30.0–36.0)
MCV: 89.5 fL (ref 78.0–100.0)
Platelets: 225 10*3/uL (ref 150–400)
RBC: 3.43 MIL/uL — AB (ref 3.87–5.11)
RDW: 14.2 % (ref 11.5–15.5)
WBC: 12.7 10*3/uL — AB (ref 4.0–10.5)

## 2015-08-03 LAB — PHOSPHORUS: PHOSPHORUS: 2.7 mg/dL (ref 2.5–4.6)

## 2015-08-03 LAB — LEGIONELLA PNEUMOPHILA SEROGP 1 UR AG: L. pneumophila Serogp 1 Ur Ag: NEGATIVE

## 2015-08-03 LAB — PROCALCITONIN: PROCALCITONIN: 11.43 ng/mL

## 2015-08-03 MED ORDER — POTASSIUM CHLORIDE 20 MEQ/15ML (10%) PO SOLN
60.0000 meq | Freq: Once | ORAL | Status: AC
  Administered 2015-08-03: 60 meq via ORAL
  Filled 2015-08-03: qty 45

## 2015-08-03 MED ORDER — INSULIN ASPART 100 UNIT/ML ~~LOC~~ SOLN
0.0000 [IU] | Freq: Every day | SUBCUTANEOUS | Status: DC
  Administered 2015-08-03: 5 [IU] via SUBCUTANEOUS

## 2015-08-03 MED ORDER — DIAZEPAM 5 MG/ML IJ SOLN
5.0000 mg | Freq: Every day | INTRAMUSCULAR | Status: DC
  Administered 2015-08-03: 5 mg via INTRAVENOUS
  Filled 2015-08-03: qty 2

## 2015-08-03 MED ORDER — INSULIN ASPART 100 UNIT/ML ~~LOC~~ SOLN
0.0000 [IU] | Freq: Three times a day (TID) | SUBCUTANEOUS | Status: DC
  Administered 2015-08-03 (×3): 3 [IU] via SUBCUTANEOUS
  Administered 2015-08-04 (×2): 2 [IU] via SUBCUTANEOUS
  Administered 2015-08-04 – 2015-08-05 (×2): 3 [IU] via SUBCUTANEOUS
  Administered 2015-08-05 (×2): 2 [IU] via SUBCUTANEOUS

## 2015-08-03 MED ORDER — LORAZEPAM 2 MG/ML IJ SOLN
2.0000 mg | Freq: Once | INTRAMUSCULAR | Status: AC
  Administered 2015-08-03: 2 mg via INTRAVENOUS
  Filled 2015-08-03: qty 1

## 2015-08-03 NOTE — Progress Notes (Signed)
Pt is stable at this time, Hollister/Tube holder change. RN assist

## 2015-08-03 NOTE — Progress Notes (Signed)
ABG collected. Pt is stable at this time.  

## 2015-08-03 NOTE — Progress Notes (Signed)
PULMONARY / CRITICAL CARE MEDICINE   Name: Laura MelnickMelody [name] MRN: [id number] DOB: November 15, 1945    ADMISSION DATE:  January 15, 2015 CONSULTATION DATE:  Jul 11, 2015  REFERRING MD :  EDP  CHIEF COMPLAINT:  Altered Mental Status  INITIAL PRESENTATION: 69 yo former smoker with altered mental status for 1 day from AKI, sepsis, acute on chronic hypoxic/hypercapnic respiratory failure. Has hx of COPD and followed by Dr. Sherene SiresWert in pulmonary office.  STUDIES:  PFT 06/07/13 >> FEV1 1.38 (65%), FEV1% 50, TLC 5.45 (118%), DLCO 49%, no BD  SIGNIFICANT EVENTS: 10/17 admit  SUBJECTIVE:  NAEON Afebrile  VITAL SIGNS: Temp:  [97.5 F (36.4 C)-99.7 F (37.6 C)] 98.7 F (37.1 C) (10/19 0744) Pulse Rate:  [84-113] 106 (10/19 0730) Resp:  [11-31] 20 (10/19 0730) BP: (86-156)/(43-142) 90/45 mmHg (10/19 0730) SpO2:  [95 %-100 %] 100 % (10/19 0730) FiO2 (%):  [40 %] 40 % (10/19 0730) Weight:  [142 lb 3.2 oz (64.5 kg)] 142 lb 3.2 oz (64.5 kg) (10/19 0146) HEMODYNAMICS:   VENTILATOR SETTINGS: Vent Mode:  [-] CPAP;PSV FiO2 (%):  [40 %] 40 % Set Rate:  [28 bmp] 28 bmp Vt Set:  [440 mL] 440 mL PEEP:  [5 cmH20] 5 cmH20 Pressure Support:  [5 cmH20] 5 cmH20 Plateau Pressure:  [14 cmH20-17 cmH20] 15 cmH20 INTAKE / OUTPUT:  Intake/Output Summary (Last 24 hours) at 08/03/15 0752 Last data filed at 08/03/15 0700  Gross per 24 hour  Intake 4289.04 ml  Output   1310 ml  Net 2979.04 ml    PHYSICAL EXAMINATION: General: NAD Neuro: alert, follows commands, interactive HEENT: Pupils reactive, ETT in place, surgical scar over anterior neck area Cardiovascular: RRR, no murmur Lungs: Decreased BS, no wheezing Abdomen: Soft, non tender, decreased BS Musculoskeletal: No edema Skin: No rashes  LABS:  CBC  Recent Labs Lab Jul 11, 2015 2045 08/03/15 0250  WBC 20.4* 12.7*  HGB 15.6* 10.3*  HCT 46.1* 30.7*  PLT 417* 225   Coag's No results for input(s): APTT, INR in the last 168 hours. BMET  Recent  Labs Lab 08/02/15 0104 08/02/15 1633 08/03/15 0250  NA 140 139 140  K 4.3 3.7 3.1*  CL 110 110 108  CO2 18* 19* 21*  BUN 25* 24* 23*  CREATININE 2.04* 1.58* 1.47*  GLUCOSE 143* 151* 186*   Electrolytes  Recent Labs Lab 08/02/15 0104 08/02/15 1633 08/03/15 0250  CALCIUM 7.8* 8.0* 8.2*  MG 3.0*  --  2.4  PHOS 6.6*  --  2.7   Sepsis Markers  Recent Labs Lab Jul 11, 2015 2131 08/02/15 0104 08/02/15 0230 08/02/15 0745 08/03/15 0250  LATICACIDVEN 2.40*  --  0.7 1.8  --   PROCALCITON  --  12.40  --   --  11.43   ABG  Recent Labs Lab 08/02/15 0326 08/02/15 0506 08/03/15 0355  PHART 7.162* 7.212* 7.334*  PCO2ART 41.6 34.8* 37.0  PO2ART 101* 110* 80.8   Liver Enzymes  Recent Labs Lab 08/02/15 0104  AST 24  ALT 15  ALKPHOS 74  BILITOT 1.7*  ALBUMIN 3.1*   Cardiac Enzymes  Recent Labs Lab 08/02/15 1130 08/02/15 1633 08/02/15 2310  TROPONINI <0.03 0.12* <0.03   Glucose  Recent Labs Lab 08/02/15 0800 08/02/15 1148 08/02/15 1600 08/02/15 1948 08/02/15 2356 08/03/15 0348  GLUCAP 185* 164* 147* 160* 205* 165*    Imaging Dg Chest Port 1 View  08/03/2015  CLINICAL DATA:  Respiratory failure. EXAM: PORTABLE CHEST 1 VIEW COMPARISON:  08/02/2015. FINDINGS: Endotracheal tube and NG tube in  stable position. Heart size stable. Low lung volumes with mild bibasilar atelectasis and/or infiltrates. No pleural effusion or pneumothorax. IMPRESSION: 1. Lines and tubes in stable position. 2. Low lung volumes with persistent mild bibasilar atelectasis and or infiltrates. Electronically Signed   By: Maisie Fus  Register   On: 08/03/2015 07:13     ASSESSMENT / PLAN:  PULMONARY ETT 10/17 >> A: Acute on chronic respiratory failure in setting of sepsis, AKI, and metabolic acidosis. 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 P:  Full vent support Scheduled duoneb with prn albuterol Solumedrol 20 mg bid ABG improved Repeat  ABG in AM CXR in AM  CARDIOVASCULAR A:  Hx of HTN. P:  Hold outpt verapamil  RENAL A:  AKI in setting of sepsis and hypovolemia >> baseline creatinine 1.08 from 03/11/15. - Cr improving Lactic acidosis. - resolved Hypokalemia P:  Foley Continue IV fluids Monitor renal fx, urine outpt Replete K  GASTROINTESTINAL A:  Constipation. Nutrition. P:  Tube feeds Protonix for SUP Bowel regimen  HEMATOLOGIC A:  Leukocytosis. - improving Mild normocytic anemia - likely 2/2 dilution from IVF P:  F/u CBC SQ heparin for DVT prevention  INFECTIOUS A:  Sepsis likely 2/2 UTI (given dirty UA), possibility of pulm source given COPD P:  CTX 10/17 >> Azithro 10/17 >>  Blood 10/17 >> NGTD Sputum 10/17 >> NGTD Urine 10/17 >> NGTD Legionella Ag 10/17 >> Pneumococcal Ag 10/17 >> neg Influenza PCR 10/17 >> neg  Follow PCT algorithm - elevated to 11.43 this AM  ENDOCRINE A:  Hx of Goiter. - TSH wnl P:  Follow CBGs  NEUROLOGIC A:  Acute encephalopathy 2nd to sepsis, respiratory failure, AKI. P:  RASS goal: -1 Propofol with prn fentanyl  FAMILY  - Updates: husband updated at bedside 10/18 - Inter-disciplinary family meet or Palliative Care meeting due by:  Done, meeting with husband, RN, MD team, and pharmacy 10/18   Erasmo Downer, MD, MPH PGY-2,  [facility] 08/03/2015 7:52 AM

## 2015-08-03 NOTE — Progress Notes (Signed)
PRN neb given, expiratory wheeze noted. RN aware.

## 2015-08-04 ENCOUNTER — Inpatient Hospital Stay (HOSPITAL_COMMUNITY): Payer: Medicare Other

## 2015-08-04 ENCOUNTER — Telehealth: Payer: Self-pay | Admitting: Internal Medicine

## 2015-08-04 ENCOUNTER — Encounter (HOSPITAL_COMMUNITY): Payer: Self-pay | Admitting: *Deleted

## 2015-08-04 LAB — POCT I-STAT 3, ART BLOOD GAS (G3+)
ACID-BASE DEFICIT: 1 mmol/L (ref 0.0–2.0)
ACID-BASE DEFICIT: 2 mmol/L (ref 0.0–2.0)
BICARBONATE: 26.7 meq/L — AB (ref 20.0–24.0)
Bicarbonate: 26.2 mEq/L — ABNORMAL HIGH (ref 20.0–24.0)
O2 SAT: 95 %
O2 SAT: 93 %
PH ART: 7.27 — AB (ref 7.350–7.450)
PO2 ART: 85 mmHg (ref 80.0–100.0)
TCO2: 28 mmol/L (ref 0–100)
TCO2: 29 mmol/L (ref 0–100)
pCO2 arterial: 57.3 mmHg (ref 35.0–45.0)
pCO2 arterial: 66.3 mmHg (ref 35.0–45.0)
pH, Arterial: 7.212 — ABNORMAL LOW (ref 7.350–7.450)
pO2, Arterial: 91 mmHg (ref 80.0–100.0)

## 2015-08-04 LAB — MAGNESIUM: Magnesium: 2 mg/dL (ref 1.7–2.4)

## 2015-08-04 LAB — BASIC METABOLIC PANEL
Anion gap: 9 (ref 5–15)
BUN: 25 mg/dL — AB (ref 6–20)
CALCIUM: 8.1 mg/dL — AB (ref 8.9–10.3)
CHLORIDE: 114 mmol/L — AB (ref 101–111)
CO2: 24 mmol/L (ref 22–32)
CREATININE: 1.08 mg/dL — AB (ref 0.44–1.00)
GFR calc non Af Amer: 52 mL/min — ABNORMAL LOW (ref >60)
GFR, EST AFRICAN AMERICAN: 60 mL/min — AB (ref >60)
GLUCOSE: 168 mg/dL — AB (ref 65–99)
Potassium: 4 mmol/L (ref 3.5–5.1)
Sodium: 147 mmol/L — ABNORMAL HIGH (ref 135–145)

## 2015-08-04 LAB — TROPONIN I

## 2015-08-04 LAB — CBC
HCT: 29.9 % — ABNORMAL LOW (ref 36.0–46.0)
Hemoglobin: 9.8 g/dL — ABNORMAL LOW (ref 12.0–15.0)
MCH: 30.1 pg (ref 26.0–34.0)
MCHC: 32.8 g/dL (ref 30.0–36.0)
MCV: 91.7 fL (ref 78.0–100.0)
PLATELETS: 237 10*3/uL (ref 150–400)
RBC: 3.26 MIL/uL — AB (ref 3.87–5.11)
RDW: 15 % (ref 11.5–15.5)
WBC: 8.3 10*3/uL (ref 4.0–10.5)

## 2015-08-04 LAB — GLUCOSE, CAPILLARY
GLUCOSE-CAPILLARY: 143 mg/dL — AB (ref 65–99)
Glucose-Capillary: 159 mg/dL — ABNORMAL HIGH (ref 65–99)
Glucose-Capillary: 136 mg/dL — ABNORMAL HIGH (ref 65–99)
Glucose-Capillary: 121 mg/dL — ABNORMAL HIGH (ref 65–99)

## 2015-08-04 LAB — PROCALCITONIN: PROCALCITONIN: 4.95 ng/mL

## 2015-08-04 LAB — TRIGLYCERIDES: Triglycerides: 184 mg/dL — ABNORMAL HIGH (ref <150)

## 2015-08-04 LAB — PHOSPHORUS: Phosphorus: 2 mg/dL — ABNORMAL LOW (ref 2.5–4.6)

## 2015-08-04 MED ORDER — SODIUM CHLORIDE 0.9 % IV SOLN
1.0000 mg/h | INTRAVENOUS | Status: DC
  Administered 2015-08-04: 1 mg/h via INTRAVENOUS
  Filled 2015-08-04 (×2): qty 10

## 2015-08-04 MED ORDER — DEXMEDETOMIDINE HCL IN NACL 400 MCG/100ML IV SOLN
0.4000 ug/kg/h | INTRAVENOUS | Status: DC
  Administered 2015-08-04: 1 ug/kg/h via INTRAVENOUS
  Administered 2015-08-04: 0.4 ug/kg/h via INTRAVENOUS
  Administered 2015-08-04 – 2015-08-06 (×8): 1.2 ug/kg/h via INTRAVENOUS
  Filled 2015-08-04 (×7): qty 100
  Filled 2015-08-04: qty 50
  Filled 2015-08-04 (×2): qty 100

## 2015-08-04 MED ORDER — SODIUM CHLORIDE 0.9 % IV SOLN
50.0000 ug/h | INTRAVENOUS | Status: DC
  Administered 2015-08-04: 300 ug/h via INTRAVENOUS
  Administered 2015-08-04: 50 ug/h via INTRAVENOUS
  Administered 2015-08-05 (×2): 300 ug/h via INTRAVENOUS
  Administered 2015-08-06: 250 ug/h via INTRAVENOUS
  Administered 2015-08-06: 300 ug/h via INTRAVENOUS
  Administered 2015-08-06: 250 ug/h via INTRAVENOUS
  Administered 2015-08-07: 200 ug/h via INTRAVENOUS
  Administered 2015-08-07: 250 ug/h via INTRAVENOUS
  Administered 2015-08-07: 150 ug/h via INTRAVENOUS
  Administered 2015-08-07: 200 ug/h via INTRAVENOUS
  Administered 2015-08-08 – 2015-08-09 (×2): 275 ug/h via INTRAVENOUS
  Administered 2015-08-09: 200 ug/h via INTRAVENOUS
  Administered 2015-08-10: 300 ug/h via INTRAVENOUS
  Administered 2015-08-10 (×3): 400 ug/h via INTRAVENOUS
  Administered 2015-08-11: 350 ug/h via INTRAVENOUS
  Filled 2015-08-04 (×18): qty 50

## 2015-08-04 MED ORDER — MIDAZOLAM HCL 5 MG/ML IJ SOLN
0.0000 mg/h | INTRAMUSCULAR | Status: DC
  Administered 2015-08-04 – 2015-08-05 (×2): 2 mg/h via INTRAVENOUS
  Administered 2015-08-05: 3 mg/h via INTRAVENOUS
  Administered 2015-08-06: 4 mg/h via INTRAVENOUS
  Administered 2015-08-06: 1 mg/h via INTRAVENOUS
  Administered 2015-08-07 (×2): 4 mg/h via INTRAVENOUS
  Administered 2015-08-09: 2 mg/h via INTRAVENOUS
  Administered 2015-08-09: 4 mg/h via INTRAVENOUS
  Administered 2015-08-09: 3 mg/h via INTRAVENOUS
  Administered 2015-08-10: 4 mg/h via INTRAVENOUS
  Administered 2015-08-10: 3 mg/h via INTRAVENOUS
  Administered 2015-08-11: 4 mg/h via INTRAVENOUS
  Filled 2015-08-04 (×11): qty 10

## 2015-08-04 MED ORDER — DEXTROSE 5 % IV SOLN
12.0000 mmol | Freq: Once | INTRAVENOUS | Status: AC
  Administered 2015-08-04: 12 mmol via INTRAVENOUS
  Filled 2015-08-04: qty 4

## 2015-08-04 MED ORDER — PIPERACILLIN-TAZOBACTAM 3.375 G IVPB
3.3750 g | Freq: Three times a day (TID) | INTRAVENOUS | Status: DC
  Administered 2015-08-04 – 2015-08-11 (×21): 3.375 g via INTRAVENOUS
  Filled 2015-08-04 (×23): qty 50

## 2015-08-04 MED ORDER — VANCOMYCIN HCL 500 MG IV SOLR
500.0000 mg | Freq: Two times a day (BID) | INTRAVENOUS | Status: DC
  Administered 2015-08-04 – 2015-08-07 (×6): 500 mg via INTRAVENOUS
  Filled 2015-08-04 (×7): qty 500

## 2015-08-04 MED ORDER — MIDAZOLAM HCL 2 MG/2ML IJ SOLN
2.0000 mg | INTRAMUSCULAR | Status: DC | PRN
  Administered 2015-08-04 (×2): 4 mg via INTRAVENOUS
  Filled 2015-08-04 (×2): qty 4

## 2015-08-04 NOTE — Telephone Encounter (Signed)
Also states patient has been intubated and he wants MW and TP aware of this also

## 2015-08-04 NOTE — Progress Notes (Signed)
Dr. Dellie CatholicSommers was notified of patient's continued elevated WOB and asynchrony with vent despite heavy sedation.  ABG redrawn and vent changes made.

## 2015-08-04 NOTE — Telephone Encounter (Signed)
Called and spoke to pt's husband. Pt's husband stated [facility] Pt's husband states he is more comfortable knowing Dr. Sherene SiresWert is a part of her care while admitted, pt's husband is not comfortable with Dr. Marchelle Gearingamaswamy being over the pt's care without Dr. Sherene SiresWert knowing and having input on her care. Pt's husband is also requesting TP be made aware of pt's condition.   Will send to Dr. Sherene SiresWert as Lorain ChildesFYI.

## 2015-08-04 NOTE — Progress Notes (Signed)
PCCM Interval Progress Note  Night coverage re-round.  Pt had been having issues earlier in the afternoon and evening with vent dysynchrony and auto PEEP / breath stacking.  Multiple vent modes tried and sedation adjusted.  Currently, pt is comfortable on current settings using PRVC mode.  She is on precedex gtt, fentanyl gtt, midazolam gtt.  Midazolam was increased from 1mg  to 2mg  and this has helped significantly.   VITAL SIGNS: Temp:  [99.4 F (37.4 C)-99.7 F (37.6 C)] 99.4 F (37.4 C) (10/20 1549) Pulse Rate:  [81-119] 86 (10/20 2200) Resp:  [5-33] 14 (10/20 2200) BP: (87-151)/(47-86) 118/59 mmHg (10/20 2200) SpO2:  [95 %-99 %] 96 % (10/20 2200) FiO2 (%):  [40 %] 40 % (10/20 2100) Weight:  [66.4 kg (146 lb 6.2 oz)] 66.4 kg (146 lb 6.2 oz) (10/20 0152)  PHYSICAL EXAM: General:  Adult female, in NAD. Neuro:  Sedated. Cardiovascular:  RRR. Lungs:  Shallow respirations. Clear bilaterally. Abdomen:  BS x 4, soft.  Musculoskeletal:  No edema.   Acute on chronic respiratory failure. Hx COPD on chronic oxygen. Plan: Continue current vent settings. Continue precedex gtt, fentanyl gtt, midazolam gtt - wean as able. ABG as needed. CXR in AM.   See progress note from 10/19 by resident Dr. Beryle FlockBacigalupo and Dr. Marchelle Gearingamaswamy for rest.   Laura Guysahul Ajeenah Heiny, PA - [facility] Pgr: [phone]  or [phone] 08/04/2015, 10:18 PM

## 2015-08-04 NOTE — Progress Notes (Signed)
Updated Dr Sherene SiresWert over phone of admission and current illness and events Will also get duplex LE Depending on course and creat /gfr 08/05/15 - can consider CT angio chest Continue SQ heparin  Recent Labs Lab 08/06/2015 2045 08/02/15 0104 08/02/15 1633 08/03/15 0250 08/04/15 0830  CREATININE 2.24* 2.04* 1.58* 1.47* 1.08*   Estimated Creatinine Clearance: 46.8 mL/min (by C-G formula based on Cr of 1.08).    Dr. Kalman ShanMurali Cherlyn Syring, M.D., [facility] Pulmonary and Critical Care Medicine Staff Physician [facility] Pager: [phone], If no answer or between  15:00h - 7:00h: call [phone]  08/04/2015 5:18 PM

## 2015-08-04 NOTE — Progress Notes (Signed)
PULMONARY / CRITICAL CARE MEDICINE   Name: Laura MelnickMelody Coupland MRN: [id number] DOB: 12-26-1945    ADMISSION DATE:  13-Jul-2015 CONSULTATION DATE:  05/28/15  REFERRING MD :  EDP  CHIEF COMPLAINT:  Altered Mental Status  INITIAL PRESENTATION: 69 yo former smoker with altered mental status for 1 day from AKI, sepsis, acute on chronic hypoxic/hypercapnic respiratory failure. Has hx of COPD and followed by Dr. Sherene SiresWert in pulmonary office.  STUDIES:  PFT 06/07/13 >> FEV1 1.38 (65%), FEV1% 50, TLC 5.45 (118%), DLCO 49%, no BD  SIGNIFICANT EVENTS: 10/17 admit  SUBJECTIVE:   Febrile to 100.6  tachypnic with increased WOB on WUA Now maxed on propofol  VITAL SIGNS: Temp:  [98.3 F (36.8 C)-100.6 F (38.1 C)] 99.4 F (37.4 C) (10/20 0333) Pulse Rate:  [91-117] 105 (10/20 0740) Resp:  [21-32] 29 (10/20 0740) BP: (90-143)/(47-69) 113/54 mmHg (10/20 0740) SpO2:  [97 %-100 %] 97 % (10/20 0740) FiO2 (%):  [40 %] 40 % (10/20 0740) Weight:  [146 lb 6.2 oz (66.4 kg)] 146 lb 6.2 oz (66.4 kg) (10/20 0152) HEMODYNAMICS:   VENTILATOR SETTINGS: Vent Mode:  [-] PRVC FiO2 (%):  [40 %] 40 % Set Rate:  [28 bmp] 28 bmp Vt Set:  [440 mL] 440 mL PEEP:  [5 cmH20] 5 cmH20 Plateau Pressure:  [14 cmH20-18 cmH20] 18 cmH20 INTAKE / OUTPUT:  Intake/Output Summary (Last 24 hours) at 08/04/15 0755 Last data filed at 08/04/15 0600  Gross per 24 hour  Intake 4886.2 ml  Output   1985 ml  Net 2901.2 ml    PHYSICAL EXAMINATION: General: NAD Neuro: sedated HEENT: Pupils reactive, ETT in place, surgical scar over anterior neck area Cardiovascular: RRR, no murmur Lungs: Decreased BS, no wheezing Abdomen: Soft, non tender, decreased BS Musculoskeletal: No edema Skin: No rashes  LABS:  CBC  Recent Labs Lab 05/28/15 2045 08/03/15 0250  WBC 20.4* 12.7*  HGB 15.6* 10.3*  HCT 46.1* 30.7*  PLT 417* 225   Coag's No results for input(s): APTT, INR in the last 168 hours. BMET  Recent Labs Lab  08/02/15 0104 08/02/15 1633 08/03/15 0250  NA 140 139 140  K 4.3 3.7 3.1*  CL 110 110 108  CO2 18* 19* 21*  BUN 25* 24* 23*  CREATININE 2.04* 1.58* 1.47*  GLUCOSE 143* 151* 186*   Electrolytes  Recent Labs Lab 08/02/15 0104 08/02/15 1633 08/03/15 0250  CALCIUM 7.8* 8.0* 8.2*  MG 3.0*  --  2.4  PHOS 6.6*  --  2.7   Sepsis Markers  Recent Labs Lab 05/28/15 2131 08/02/15 0104 08/02/15 0230 08/02/15 0745 08/03/15 0250  LATICACIDVEN 2.40*  --  0.7 1.8  --   PROCALCITON  --  12.40  --   --  11.43   ABG  Recent Labs Lab 08/02/15 0326 08/02/15 0506 08/03/15 0355  PHART 7.162* 7.212* 7.334*  PCO2ART 41.6 34.8* 37.0  PO2ART 101* 110* 80.8   Liver Enzymes  Recent Labs Lab 08/02/15 0104  AST 24  ALT 15  ALKPHOS 74  BILITOT 1.7*  ALBUMIN 3.1*   Cardiac Enzymes  Recent Labs Lab 08/02/15 1130 08/02/15 1633 08/02/15 2310  TROPONINI <0.03 0.12* <0.03   Glucose  Recent Labs Lab 08/02/15 1600 08/02/15 1948 08/02/15 2356 08/03/15 0348 08/03/15 0738 08/03/15 1127  GLUCAP 147* 160* 205* 165* 171* 183*    Imaging Dg Chest Port 1 View  08/04/2015  CLINICAL DATA:  Acute respiratory failure. EXAM: PORTABLE CHEST 1 VIEW COMPARISON:  08/03/2015.  CT 12/15/2014.  FINDINGS: Patient rotated to the right. Endotracheal tube and NG tube in stable position. Heart size stable. Low lung volumes with persistent bibasilar atelectasis. No pleural effusion or pneumothorax. IMPRESSION: 1. Lines and tubes in stable position. 2. Is a low lung volumes with persistent bibasilar atelectasis. Electronically Signed   By: Maisie Fus  Register   On: 08/04/2015 07:14     ASSESSMENT / PLAN:  PULMONARY ETT 10/17 >> A: Acute on chronic respiratory failure in setting of sepsis, AKI, and metabolic acidosis. 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 P:  Full vent support Scheduled duoneb with prn albuterol Solumedrol 20 mg bid Repeat ABG  in AM CXR in AM  CARDIOVASCULAR A:  Hx of HTN. P:  Hold outpt verapamil  RENAL A:  AKI in setting of sepsis and hypovolemia >> baseline creatinine 1.08 from 03/11/15. - Cr improving Lactic acidosis. - resolved Hypokalemia P:  Foley Continue IV fluids Monitor renal fx, urine outpt F/u AM BMET  GASTROINTESTINAL A:  Constipation. Nutrition. P:  Tube feeds Protonix for SUP Bowel regimen  HEMATOLOGIC A:  Leukocytosis. - improving Mild normocytic anemia - likely 2/2 dilution from IVF P:  F/u CBC SQ heparin for DVT prevention  INFECTIOUS A:  Sepsis likely 2/2 UTI (given dirty UA), possibility of pulm source given COPD P:  CTX 10/17 >> Azithro 10/17 >>  Blood 10/17 >> NGTD Sputum 10/17 >> NGTD Urine 10/17 >> NGTD Legionella Ag 10/17 >> Pneumococcal Ag 10/17 >> neg Influenza PCR 10/17 >> neg  Follow PCT algorithm - elevated to 11.43 10/19  ENDOCRINE A:  Hx of Goiter. - TSH wnl P:  Follow CBGs  NEUROLOGIC A:  Acute encephalopathy 2nd to sepsis, respiratory failure, AKI. P:  RASS goal: -1 Propofol with prn fentanyl  FAMILY  - Updates: husband updated at bedside 10/19 - Inter-disciplinary family meet or Palliative Care meeting due by:  Done, meeting with husband, RN, MD team, and pharmacy 10/18   Erasmo Downer, MD, MPH PGY-2,  [facility] 08/04/2015 7:55 AM

## 2015-08-04 NOTE — Progress Notes (Signed)
ANTIBIOTIC CONSULT NOTE - INITIAL  Pharmacy Consult for vancomycin and zosyn Indication: pneumonia  Allergies  Allergen Reactions  . Tramadol     memory issues  . Boniva [Ibandronic Acid]     Cough, achy    Patient Measurements: Height: $RemoveBefo cm) Weight: 146 lb 6.2 oz (66.4 kg) IBW/kg (Calculated) : 54.7 Adjusted Body Weight:   Vital Signs: Temp: 99.7 F (37.6 C) (10/20 1136) Temp Source: Oral (10/20 1136) BP: 89/47 mmHg (10/20 1100) Pulse Rate: 94 (10/20 1100) Intake/Output from previous day: 10/19 0701 - 10/20 0700 In: 5085.6 [I.V.:3345.6; NG/GT:1440; IV Piggyback:300] Out: 1985 [Urine:1985] Intake/Output from this shift: Total I/O In: 840.8 [I.V.:600.8; NG/GT:240] Out: 335 [Urine:335]  Labs:  Recent Labs  08/10/15 2045  08/02/15 1633 08/03/15 0250 08/04/15 0830  WBC 20.4*  --   --  12.7* 8.3  HGB 15.6*  --   --  10.3* 9.8*  PLT 417*  --   --  225 237  CREATININE 2.24*  < > 1.58* 1.47* 1.08*  < > = values in this interval not displayed. Estimated Creatinine Clearance: 46.8 mL/min (by C-G formula based on Cr of 1.08). No results for input(s): VANCOTROUGH, VANCOPEAK, VANCORANDOM, GENTTROUGH, GENTPEAK, GENTRANDOM, TOBRATROUGH, TOBRAPEAK, TOBRARND, AMIKACINPEAK, AMIKACINTROU, AMIKACIN in the last 72 hours.   Microbiology: Recent Results (from the past 720 hour(s))  Urine culture     Status: None   Collection Time: August 10, 2015  9:27 PM  Result Value Ref Range Status   Specimen Description URINE, CATHETERIZED  Final   Special Requests NONE  Final   Culture NO GROWTH 1 DAY  Final   Report Status 08/02/2015 FINAL  Final  Blood Culture (routine x 2)     Status: None (Preliminary result)   Collection Time: 08/10/2015  9:38 PM  Result Value Ref Range Status   Specimen Description BLOOD LEFT HAND  Final   Special Requests BOTTLES DRAWN AEROBIC AND ANAEROBIC 5CC  Final   Culture NO GROWTH 2 DAYS  Final   Report Status PENDING  Incomplete  Blood Culture  (routine x 2)     Status: None (Preliminary result)   Collection Time: 08-10-15  9:40 PM  Result Value Ref Range Status   Specimen Description BLOOD RIGHT HAND  Final   Special Requests BOTTLES DRAWN AEROBIC AND ANAEROBIC 5CC  Final   Culture NO GROWTH 2 DAYS  Final   Report Status PENDING  Incomplete  MRSA PCR Screening     Status: None   Collection Time: 08/02/15 12:49 AM  Result Value Ref Range Status   MRSA by PCR NEGATIVE NEGATIVE Final    Comment:        The GeneXpert MRSA Assay (FDA approved for NASAL specimens only), is one component of a comprehensive MRSA colonization surveillance program. It is not intended to diagnose MRSA infection nor to guide or monitor treatment for MRSA infections.     Medical History: Past Medical History  Diagnosis Date  . COPD (chronic obstructive pulmonary disease) (HCC)   . Anxiety   . Hypertension   . Allergic rhinitis   . Goiter   . Asthma   . Shingles   . Pneumonia 2014  . GERD (gastroesophageal reflux disease)   . Arthritis     "joints" (02/17/2014)  . Chronic lower back pain   . On home oxygen therapy     "1.5L at night" (02/17/2014)  . Postherpetic neuralgia     "d/t shingles"    Medications:  Scheduled:  .  antiseptic oral rinse  7 mL Mouth Rinse 10 times per day  . chlorhexidine gluconate  15 mL Mouth Rinse BID  . diazepam  5 mg Intravenous Q0600  . heparin subcutaneous  5,000 Units Subcutaneous 3 times per day  . insulin aspart  0-15 Units Subcutaneous TID WC  . insulin aspart  0-5 Units Subcutaneous QHS  . ipratropium-albuterol  3 mL Nebulization Q6H  . methylPREDNISolone (SOLU-MEDROL) injection  20 mg Intravenous Q12H  . pantoprazole sodium  40 mg Per Tube Q24H  . piperacillin-tazobactam (ZOSYN)  IV  3.375 g Intravenous 3 times per day  . potassium phosphate IVPB (mmol)  12 mmol Intravenous Once  . vancomycin  500 mg Intravenous Q12H   Assessment: 69 yo female admitted with respiratory failure and sepsis. PMH of  COPD and considering pulm source. Admitted on 10/17 and started on CTX and Azithro. Now transitioning to vancomycin and zosyn due to poor improvement.  Urine 10/20 >> Sputum 10/20 >> Blood 10/20 >> Blood 10/17 >> NGTD Sputum 10/17 >> NGTD Urine 10/17 >> NGTD Legionella Ag 10/17 >> Pneumococcal Ag 10/17 >> neg Influenza PCR 10/17 >> neg  Goal of Therapy:  Vancomycin trough level 15-20 mcg/ml  Plan:  - D/C CTX and Azithro - Vancomycin 500 mg IV Q12H - Zosyn 3.375 mg IV Q8H - F/u renal fxn and adjust abx as necessary and cultures - Vanc trough at steady state  Sherron MondayAubrey N. Madeleine Fenn, PharmD Clinical Pharmacy Resident Pager: [phone] 08/04/2015 12:13 PM

## 2015-08-04 NOTE — Progress Notes (Signed)
Called by RN for agitation and WOB  Patient fighting vent settings and auto-PEEPing  - start versed drip - continue to titrate precedex - decrease RR to 14  Erasmo DownerAngela M Willine Schwalbe, MD, MPH PGY-2,  [facility] 08/04/2015 12:22 PM

## 2015-08-05 ENCOUNTER — Inpatient Hospital Stay (HOSPITAL_COMMUNITY): Payer: Medicare Other

## 2015-08-05 DIAGNOSIS — J80 Acute respiratory distress syndrome: Secondary | ICD-10-CM

## 2015-08-05 DIAGNOSIS — J9690 Respiratory failure, unspecified, unspecified whether with hypoxia or hypercapnia: Secondary | ICD-10-CM

## 2015-08-05 LAB — BLOOD GAS, ARTERIAL
ACID-BASE DEFICIT: 2.3 mmol/L — AB (ref 0.0–2.0)
BICARBONATE: 24.2 meq/L — AB (ref 20.0–24.0)
Drawn by: 42624
FIO2: 0.4
LHR: 6 {breaths}/min
O2 Saturation: 95 %
PEEP/CPAP: 5 cmH2O
PO2 ART: 80.2 mmHg (ref 80.0–100.0)
Patient temperature: 98.6
Pressure support: 15 cmH2O
TCO2: 26 mmol/L (ref 0–100)
VT: 450 mL
pCO2 arterial: 59.5 mmHg (ref 35.0–45.0)
pH, Arterial: 7.233 — ABNORMAL LOW (ref 7.350–7.450)

## 2015-08-05 LAB — TROPONIN I: TROPONIN I: 0.03 ng/mL (ref <0.031)

## 2015-08-05 LAB — GLUCOSE, CAPILLARY
GLUCOSE-CAPILLARY: 188 mg/dL — AB (ref 65–99)
GLUCOSE-CAPILLARY: 179 mg/dL — AB (ref 65–99)
Glucose-Capillary: 134 mg/dL — ABNORMAL HIGH (ref 65–99)
Glucose-Capillary: 193 mg/dL — ABNORMAL HIGH (ref 65–99)
Glucose-Capillary: 139 mg/dL — ABNORMAL HIGH (ref 65–99)
Glucose-Capillary: 127 mg/dL — ABNORMAL HIGH (ref 65–99)

## 2015-08-05 LAB — CBC
HCT: 30.6 % — ABNORMAL LOW (ref 36.0–46.0)
HEMOGLOBIN: 9.6 g/dL — AB (ref 12.0–15.0)
MCH: 30.2 pg (ref 26.0–34.0)
MCHC: 31.4 g/dL (ref 30.0–36.0)
MCV: 96.2 fL (ref 78.0–100.0)
Platelets: 211 10*3/uL (ref 150–400)
RBC: 3.18 MIL/uL — AB (ref 3.87–5.11)
RDW: 15.5 % (ref 11.5–15.5)
WBC: 8.7 10*3/uL (ref 4.0–10.5)

## 2015-08-05 LAB — PHOSPHORUS: Phosphorus: 4.2 mg/dL (ref 2.5–4.6)

## 2015-08-05 LAB — BASIC METABOLIC PANEL
ANION GAP: 9 (ref 5–15)
BUN: 32 mg/dL — ABNORMAL HIGH (ref 6–20)
CHLORIDE: 109 mmol/L (ref 101–111)
CO2: 25 mmol/L (ref 22–32)
Calcium: 7.6 mg/dL — ABNORMAL LOW (ref 8.9–10.3)
Creatinine, Ser: 1.18 mg/dL — ABNORMAL HIGH (ref 0.44–1.00)
GFR calc non Af Amer: 46 mL/min — ABNORMAL LOW (ref >60)
GFR, EST AFRICAN AMERICAN: 54 mL/min — AB (ref >60)
GLUCOSE: 157 mg/dL — AB (ref 65–99)
Potassium: 5.3 mmol/L — ABNORMAL HIGH (ref 3.5–5.1)
Sodium: 143 mmol/L (ref 135–145)

## 2015-08-05 LAB — URINE CULTURE: CULTURE: NO GROWTH

## 2015-08-05 LAB — PROCALCITONIN: Procalcitonin: 3.05 ng/mL

## 2015-08-05 LAB — MAGNESIUM: Magnesium: 2.1 mg/dL (ref 1.7–2.4)

## 2015-08-05 LAB — HEPARIN LEVEL (UNFRACTIONATED): HEPARIN UNFRACTIONATED: 0.77 [IU]/mL — AB (ref 0.30–0.70)

## 2015-08-05 LAB — LACTIC ACID, PLASMA: Lactic Acid, Venous: 1.1 mmol/L (ref 0.5–2.0)

## 2015-08-05 MED ORDER — IPRATROPIUM-ALBUTEROL 0.5-2.5 (3) MG/3ML IN SOLN
3.0000 mL | RESPIRATORY_TRACT | Status: DC
  Administered 2015-08-05 – 2015-08-11 (×36): 3 mL via RESPIRATORY_TRACT
  Filled 2015-08-05 (×36): qty 3

## 2015-08-05 MED ORDER — INSULIN ASPART 100 UNIT/ML ~~LOC~~ SOLN
0.0000 [IU] | SUBCUTANEOUS | Status: DC
  Administered 2015-08-06: 2 [IU] via SUBCUTANEOUS
  Administered 2015-08-06: 3 [IU] via SUBCUTANEOUS
  Administered 2015-08-06: 5 [IU] via SUBCUTANEOUS
  Administered 2015-08-06: 2 [IU] via SUBCUTANEOUS
  Administered 2015-08-07 (×2): 3 [IU] via SUBCUTANEOUS
  Administered 2015-08-07: 2 [IU] via SUBCUTANEOUS
  Administered 2015-08-07 – 2015-08-08 (×9): 3 [IU] via SUBCUTANEOUS
  Administered 2015-08-09 (×2): 5 [IU] via SUBCUTANEOUS
  Administered 2015-08-09: 2 [IU] via SUBCUTANEOUS
  Administered 2015-08-09 (×2): 3 [IU] via SUBCUTANEOUS
  Administered 2015-08-09 – 2015-08-10 (×2): 5 [IU] via SUBCUTANEOUS
  Administered 2015-08-10 (×2): 3 [IU] via SUBCUTANEOUS
  Administered 2015-08-10: 5 [IU] via SUBCUTANEOUS
  Administered 2015-08-10: 3 [IU] via SUBCUTANEOUS
  Administered 2015-08-11: 5 [IU] via SUBCUTANEOUS
  Administered 2015-08-11: 3 [IU] via SUBCUTANEOUS
  Administered 2015-08-11: 5 [IU] via SUBCUTANEOUS

## 2015-08-05 MED ORDER — HEPARIN (PORCINE) IN NACL 100-0.45 UNIT/ML-% IJ SOLN
950.0000 [IU]/h | INTRAMUSCULAR | Status: DC
  Administered 2015-08-05: 1200 [IU]/h via INTRAVENOUS
  Administered 2015-08-06: 900 [IU]/h via INTRAVENOUS
  Administered 2015-08-07: 950 [IU]/h via INTRAVENOUS
  Administered 2015-08-08 (×2): 1000 [IU]/h via INTRAVENOUS
  Administered 2015-08-10: 950 [IU]/h via INTRAVENOUS
  Filled 2015-08-05 (×8): qty 250

## 2015-08-05 NOTE — Progress Notes (Signed)
Pt stacking breaths on the vent, even when sedation is increased. Elink MD notifed. Changes to vent made by RT. Pt stacking much less now, appears comfortable. Will update MD on how Pt does with changes in vent settings and sedation. Will continue to monitor closely.

## 2015-08-05 NOTE — Progress Notes (Signed)
ANTICOAGULATION CONSULT NOTE - Follow-up Consult  Pharmacy Consult for heparin Indication: Bilateral DVTs  Allergies  Allergen Reactions  . Tramadol     memory issues  . Boniva [Ibandronic Acid]     Cough, achy    Patient Measurements: Height: 5' (152.4 cm) Weight: 156 lb 4.9 oz (70.9 kg) IBW/kg (Calculated) : 45.5  Vital Signs: Temp: 99.5 F (37.5 C) (10/21 1942) Temp Source: Oral (10/21 1942) BP: 106/54 mmHg (10/21 2200) Pulse Rate: 77 (10/21 2200)  Labs:  Recent Labs  08/03/15 0250 08/04/15 0830 08/04/15 1943 08/05/15 0307 08/05/15 2241  HGB 10.3* 9.8*  --  9.6*  --   HCT 30.7* 29.9*  --  30.6*  --   PLT 225 237  --  211  --   HEPARINUNFRC  --   --   --   --  0.77*  CREATININE 1.47* 1.08*  --  1.18*  --   TROPONINI  --   --  <0.03 0.03  --     Estimated Creatinine Clearance: 40.1 mL/min (by C-G formula based on Cr of 1.18).   Assessment: 69 YOF on heparin for bilateral DVT's. Heparin level supratherapeutic (0.77) on 1200 units/hr. No issues with line or bleeding reported per RN.  Goal of Therapy:  Heparin level 0.3-0.7 units/ml Monitor platelets by anticoagulation protocol: Yes   Plan:  -Decrease heparin to 1100 units/hr. -F/u 6 hr HL  Christoper Fabianaron Alysson Geist, PharmD, BCPS Clinical pharmacist, pager [phone] 08/05/2015 11:36 PM

## 2015-08-05 NOTE — Progress Notes (Signed)
*  Preliminary Results* Bilateral lower extremity venous duplex completed. Bilateral lower extremities are positive for acute deep vein thrombosis involving bilateral gastrocnemius veins. There is no evidence of Baker's cyst bilaterally.  Preliminary results discussed with Morrie Sheldonshley, RN.  08/05/2015  Gertie FeyMichelle Teanna Elem, RVT, RDCS, RDMS

## 2015-08-05 NOTE — Progress Notes (Signed)
  Echocardiogram 2D Echocardiogram has been performed.  Janzen Sacks 08/05/2015, 1:09 PM

## 2015-08-05 NOTE — Progress Notes (Signed)
  Echocardiogram 2D Echocardiogram has been performed.  [name] 08/05/2015, 10:02 AM

## 2015-08-05 NOTE — Progress Notes (Signed)
ANTICOAGULATION CONSULT NOTE - Initial Consult  Pharmacy Consult for heparin Indication: Bilateral DVTs  Allergies  Allergen Reactions  . Tramadol     memory issues  . Boniva [Ibandronic Acid]     Cough, achy    Patient Measurements: Height: 5' (152.4 cm) Weight: 156 lb 4.9 oz (70.9 kg) IBW/kg (Calculated) : 45.5  Vital Signs: Temp: 99 F (37.2 C) (10/21 1204) Temp Source: Oral (10/21 1204) BP: 119/60 mmHg (10/21 1400) Pulse Rate: 80 (10/21 1400)  Labs:  Recent Labs  08/02/15 2310  08/03/15 0250 08/04/15 0830 08/04/15 1943 08/05/15 0307  HGB  --   < > 10.3* 9.8*  --  9.6*  HCT  --   --  30.7* 29.9*  --  30.6*  PLT  --   --  225 237  --  211  CREATININE  --   --  1.47* 1.08*  --  1.18*  TROPONINI <0.03  --   --   --  <0.03 0.03  < > = values in this interval not displayed.  Estimated Creatinine Clearance: 40.1 mL/min (by C-G formula based on Cr of 1.18).   Medical History: Past Medical History  Diagnosis Date  . COPD (chronic obstructive pulmonary disease) (HCC)   . Anxiety   . Hypertension   . Allergic rhinitis   . Goiter   . Asthma   . Shingles   . Pneumonia 2014  . GERD (gastroesophageal reflux disease)   . Arthritis     "joints" (02/17/2014)  . Chronic lower back pain   . On home oxygen therapy     "1.5L at night" (02/17/2014)  . Postherpetic neuralgia     "d/t shingles"    Medications:  Prescriptions prior to admission  Medication Sig Dispense Refill Last Dose  . acetaminophen (TYLENOL) 500 MG tablet Take 500 mg by mouth every 6 (six) hours as needed for mild pain, fever or headache. Per bottle as needed for pain/headache   Past Week at Unknown time  . albuterol (PROAIR HFA) 108 (90 BASE) MCG/ACT inhaler INHALE 2 PUFFS INTO LUNGS EVERY 4 HOURS AS NEEDED WHEEZING 8.5 Inhaler 0 Past Week at Unknown time  . albuterol (PROVENTIL) (2.5 MG/3ML) 0.083% nebulizer solution Take 3 mLs (2.5 mg total) by nebulization every 4 (four) hours as needed for  wheezing or shortness of breath. 25 vial 5 Past Week at Unknown time  . B Complex-C (B-COMPLEX WITH VITAMIN C) tablet Take 1 tablet by mouth every morning.   Past Week at Unknown time  . budesonide-formoterol (SYMBICORT) 160-4.5 MCG/ACT inhaler Inhale 2 puffs into the lungs 4 (four) times daily.   Past Week at Unknown time  . Calcium Carbonate-Vitamin D (CALCIUM-VITAMIN D) 500-200 MG-UNIT per tablet Take 1 tablet by mouth every morning.    Past Week at Unknown time  . cetirizine (ZYRTEC) 10 MG tablet Take 10 mg by mouth at bedtime as needed (for nasal drainage).    Past Week at Unknown time  . cholecalciferol (VITAMIN D) 1000 UNITS tablet Take 1,000 Units by mouth daily.    Past Week at Unknown time  . dextromethorphan (DELSYM) 30 MG/5ML liquid Take 15 mg by mouth every 12 (twelve) hours. Husband states she takes every 12 hours   08/02/2015 at Unknown time  . diazepam (VALIUM) 5 MG tablet TAKE 1 TABLET BY MOUTH EVERY 6 HOURS AS NEEDED FOR ANXIETY 120 tablet 2 Past Week at Unknown time  . ibuprofen (ADVIL,MOTRIN) 100 MG tablet Take 400 mg by mouth  every 6 (six) hours as needed for pain or fever (Patient takes between 1-6 tablets for pain).   Past Week at Unknown time  . omeprazole (PRILOSEC) 40 MG capsule Take 40 mg by mouth daily.   Past Week at Unknown time  . OXYGEN Inhale into the lungs. Wear 2L of O2 as needed for increased shortness of breath with activity and at bedtime   Past Week at Unknown time  . polyethylene glycol (MIRALAX / GLYCOLAX) packet Take 17 g by mouth daily as needed (constipation).    07/31/2015 at Unknown time  . predniSONE (DELTASONE) 5 MG tablet Take 5 mg by mouth daily with breakfast.   Past Week at Unknown time  . Respiratory Therapy Supplies (FLUTTER) DEVI Use as directed 1 each 0 unknown at Unknown time  . SYMBICORT 160-4.5 MCG/ACT inhaler INHALE 2 PUFFS INTO THE LUNGS 2 (TWO) TIMES DAILY. 10.2 Inhaler 6 07/31/2015 at Unknown time  . Tiotropium Bromide Monohydrate (SPIRIVA  RESPIMAT) 2.5 MCG/ACT AERS 2 each am (Patient taking differently: Take 2 puffs each morning ((PLAN A))) 1 Inhaler 0 Past Week at Unknown time  . verapamil (VERELAN PM) 180 MG 24 hr capsule TAKE 1 CAPSULE (180 MG TOTAL) BY MOUTH AT BEDTIME. 30 capsule 11 Past Week at Unknown time  . [DISCONTINUED] dextromethorphan-guaiFENesin (MUCINEX DM) 30-600 MG per 12 hr tablet Take 1-2 tablets by mouth 2 (two) times daily as needed (w/ flutter valve for cough).    Not Taking at Unknown time  . [DISCONTINUED] famotidine (PEPCID) 20 MG tablet Take 20 mg by mouth at bedtime.   Not Taking at Unknown time  . [DISCONTINUED] PROAIR HFA 108 (90 BASE) MCG/ACT inhaler INHALE 2 PUFFS INTO LUNGS EVERY 4 HOURS AS NEEDED WHEEZING (Patient not taking: Reported on 08/02/2015) 8.5 Inhaler 2 Not Taking at Unknown time    Assessment: 86 YOF admitted with altered mental status now on the ventilator but having difficulty weaning. Lower extremity duplex positive for bilateral DVT's. Pharmacy consulted to start IV heparin. She received a dose of subQ heparin at ~ 1430. H/H low but stable, Plt wnl. No signs of bleeding observed.   Goal of Therapy:  Heparin level 0.3-0.7 units/ml Monitor platelets by anticoagulation protocol: Yes   Plan:  -Start heparin IV at 1200 units/hr. NO BOLUS  -F/u 6 hr HL -Monitor daily CBC, heparin level and s/s of bleeding    Vinnie Level, PharmD., BCPS Clinical Pharmacist Pager [phone]

## 2015-08-05 NOTE — Progress Notes (Signed)
MD Mannam made aware that pts BLE dopplers were positive  Will monitor [name], [name] [name]

## 2015-08-05 NOTE — Progress Notes (Signed)
CRITICAL VALUE ALERT  Critical value received:  Blood Gas CO2 59.5  Date of notification:  08/05/15  Time of notification:  0435  Critical value read back:Yes.    Nurse who received alert:  Fuller CanadaAmy Auther Lyerly RN  MD notified (1st page):  Dr Delton CoombesByrum MD  Time of first page:  0440  MD notified (2nd page):  Time of second page:  Responding MD:  Dr Delton CoombesByrum MD  Time MD responded:  [phone]

## 2015-08-05 NOTE — Progress Notes (Signed)
At approx 0900, pt began to bite down on ETT and then dropped O sats into high 80's, became tachycardic and began to throw PVCs. Sedation resumed at 100%. Will monitor

## 2015-08-05 NOTE — Progress Notes (Signed)
PULMONARY / CRITICAL CARE MEDICINE   Name: [name] MRN: [id number] DOB: Mar 15, 1946    ADMISSION DATE:  08/15/2015 CONSULTATION DATE:  08/07/2015  REFERRING MD :  EDP  CHIEF COMPLAINT:  Altered Mental Status  INITIAL PRESENTATION: 69 yo former smoker with altered mental status for 1 day from AKI, sepsis, acute on chronic hypoxic/hypercapnic respiratory failure. Has hx of COPD and followed by Dr. Sherene Sires in pulmonary office.  STUDIES:  PFT 06/07/13 >> FEV1 1.38 (65%), FEV1% 50, TLC 5.45 (118%), DLCO 49%, no BD  SIGNIFICANT EVENTS: 10/17 admit  SUBJECTIVE:   Afebrile Breath stacking on vent LE dopplers ordered  VITAL SIGNS: Temp:  [98.1 F (36.7 C)-99.7 F (37.6 C)] 98.1 F (36.7 C) (10/21 0354) Pulse Rate:  [79-108] 83 (10/21 0751) Resp:  [5-31] 17 (10/21 0751) BP: (87-145)/(47-86) 138/73 mmHg (10/21 0751) SpO2:  [91 %-97 %] 97 % (10/21 0751) FiO2 (%):  [40 %] 40 % (10/21 0751) Weight:  [156 lb 4.9 oz (70.9 kg)] 156 lb 4.9 oz (70.9 kg) (10/21 0325) HEMODYNAMICS:   VENTILATOR SETTINGS: Vent Mode:  [-] SIMV;PRVC FiO2 (%):  [40 %] 40 % Set Rate:  [6 bmp-28 bmp] 8 bmp Vt Set:  [440 mL-500 mL] 450 mL PEEP:  [5 cmH20] 5 cmH20 Pressure Support:  [15 cmH20] 15 cmH20 Plateau Pressure:  [11 cmH20-20 cmH20] 20 cmH20 INTAKE / OUTPUT:  Intake/Output Summary (Last 24 hours) at 08/05/15 0803 Last data filed at 08/05/15 0700  Gross per 24 hour  Intake 4826.41 ml  Output   1104 ml  Net 3722.41 ml    PHYSICAL EXAMINATION: General: NAD, chronically ill appearing female Neuro: sedated HEENT: Pupils reactive, ETT in place, surgical scar over anterior neck area Cardiovascular: RRR, no murmur Lungs: Decreased BS, no wheezing Abdomen: Soft, non tender, decreased BS Musculoskeletal: No edema Skin: No rashes  LABS:  CBC  Recent Labs Lab 08/03/15 0250 08/04/15 0830 08/05/15 0307  WBC 12.7* 8.3 8.7  HGB 10.3* 9.8* 9.6*  HCT 30.7* 29.9* 30.6*  PLT 225 237 211    Coag's No results for input(s): APTT, INR in the last 168 hours. BMET  Recent Labs Lab 08/03/15 0250 08/04/15 0830 08/05/15 0307  NA 140 147* 143  K 3.1* 4.0 5.3*  CL 108 114* 109  CO2 21* 24 25  BUN 23* 25* 32*  CREATININE 1.47* 1.08* 1.18*  GLUCOSE 186* 168* 157*   Electrolytes  Recent Labs Lab 08/03/15 0250 08/04/15 0830 08/05/15 0307  CALCIUM 8.2* 8.1* 7.6*  MG 2.4 2.0 2.1  PHOS 2.7 2.0* 4.2   Sepsis Markers  Recent Labs Lab 08/02/15 0104 08/02/15 0230 08/02/15 0745 08/03/15 0250 08/04/15 1158 08/05/15 0307  LATICACIDVEN  --  0.7 1.8  --   --  1.1  PROCALCITON 12.40  --   --  11.43 4.95  --    ABG  Recent Labs Lab 08/04/15 1337 08/04/15 1801 08/05/15 0420  PHART 7.270* 7.212* 7.233*  PCO2ART 57.3* 66.3* 59.5*  PO2ART 91.0 85.0 80.2   Liver Enzymes  Recent Labs Lab 08/02/15 0104  AST 24  ALT 15  ALKPHOS 74  BILITOT 1.7*  ALBUMIN 3.1*   Cardiac Enzymes  Recent Labs Lab 08/02/15 2310 08/04/15 1943 08/05/15 0307  TROPONINI <0.03 <0.03 0.03   Glucose  Recent Labs Lab 08/04/15 0805 08/04/15 1130 08/04/15 1548 08/04/15 1957 08/04/15 2327 08/05/15 0356  GLUCAP 159* 143* 136* 121* 188* 134*    Imaging Dg Chest Port 1 View  08/04/2015  CLINICAL DATA:  Pt with ETT, having some resp distress, per rn EXAM: PORTABLE CHEST 1 VIEW COMPARISON:  Earlier today at 0447 hours FINDINGS: 1432 hours. Patient rotated minimally right. Endotracheal tube terminates 2.8 cm above carina. Nasogastric extends beyond the inferior aspect of the film. Mild right hemidiaphragm elevation. Normal heart size. No pleural effusion or pneumothorax. Improved bibasilar aeration with mild left base atelectasis remaining. Thoracolumbar region vertebral augmentation. IMPRESSION: Stable and appropriate position of support apparatus. Improved bibasilar aeration with left base atelectasis remaining. Electronically Signed   By: Jeronimo GreavesKyle  Talbot M.D.   On: 08/04/2015 14:44      ASSESSMENT / PLAN:  PULMONARY ETT 10/17 >> A: Acute on chronic respiratory failure in setting of sepsis, AKI, and metabolic acidosis. 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 P:  Full vent support Scheduled duoneb with prn albuterol Solumedrol 20 mg bid Repeat ABG in AM CXR in AM  CARDIOVASCULAR A:  Hx of HTN. P:  Hold outpt verapamil  RENAL A:  AKI in setting of sepsis and hypovolemia >> baseline creatinine 1.08 from 03/11/15. - Cr improving Lactic acidosis. - resolved Hypokalemia - resolved P:  Foley Continue IV fluids Monitor renal fx, urine outpt F/u AM BMET  GASTROINTESTINAL A:  Constipation. Nutrition. P:  Tube feeds Protonix for SUP Bowel regimen  HEMATOLOGIC A:  Leukocytosis. - improving Mild normocytic anemia - likely 2/2 dilution from IVF P:  F/u CBC SQ heparin for DVT prevention  INFECTIOUS A:  Sepsis likely 2/2 UTI (given dirty UA), possibility of pulm source given COPD P:  CTX 10/17 >>10/20 Azithro 10/17 >>10/20  Vanc 10/20>> Zosyn 10/20>>  Blood 10/17 >> NGTD Sputum 10/17 >> NGTD Urine 10/17 >> NGTD Legionella Ag 10/17 >> Pneumococcal Ag 10/17 >> neg Influenza PCR 10/17 >> neg  Follow PCT algorithm - down to 4.95 10/20  ENDOCRINE A:  Hx of Goiter. - TSH wnl P:  Follow CBGs  NEUROLOGIC A:  Acute encephalopathy 2nd to sepsis, respiratory failure, AKI. P:  RASS goal: -1 Precedex, fentanyl, versed gtt - wean versed  FAMILY  - Updates: husband updated at bedside 10/20 - Inter-disciplinary family meet or Palliative Care meeting due by:  Done, meeting with husband, RN, MD team, and pharmacy 10/18   Erasmo DownerAngela M Bacigalupo, MD, MPH PGY-2,  [facility] 08/05/2015 8:03 AM

## 2015-08-05 NOTE — Progress Notes (Signed)
Despite increasing sedation and having RT attempt vent manuevers, pt appears to be stacking breaths on the vent and is having dyschronism  On vent. TVO received to place pt on PS of 20 with a peep of 5. Marisue IvanLiz RRT made aware of orders  Felipa EmoryJarrell, Violeta Lecount Denise

## 2015-08-05 NOTE — Progress Notes (Signed)
Pt changed to CPAP5/PS20 per TO. Keep VT around 

## 2015-08-05 NOTE — Progress Notes (Signed)
eLink Physician-Brief Progress Note Patient Name: Laura MelnickMelody [name] DOB: 08/16/1946 MRN: [id number]   Date of Service  08/05/2015  HPI/Events of Note  Vent mechanics evaluated.  She is doubling breaths but is not tachypneic, certainly due to her COPd and lung mechanics and strong negative inspiratory pressures. There is no appreciable auto-peep on this level sedation.   Best mode for her at this point would be PSV, but I am concerned that she may be slightly too sedated to meet ventilatory needs at this time.  Will use SIMV for now, plan to transition to PSV when versed decreased . Goal for tonight will be wean fentanyl, get off versed, titrate precedex and get her to high PSV in prep for true SBT in am.   eICU Interventions       Intervention Category Major Interventions: Respiratory failure - evaluation and management  Brendt Dible S. 08/05/2015, 2:41 AM

## 2015-08-05 NOTE — Progress Notes (Signed)
eLink Physician-Brief Progress Note Patient Name: Brandon MelnickMelody Deberry DOB: [birth year] MRN: [id number]   Date of Service  08/05/2015  HPI/Events of Note  Difficulty attaining vent synchrony with SIMV.  eICU Interventions  Changed to pressure support 20 over PEEP 5 with goal Vt 400 to 500.      Intervention Category Major Interventions: Other:  Maleya Leever 08/05/2015, 7:41 PM

## 2015-08-05 NOTE — Procedures (Signed)
Notified by RN about pt breath stacking.  Dr. Delton CoombesByrum notified via elink and vent changes made for pt comfort.  RT will continue to monitor pt.

## 2015-08-05 NOTE — Progress Notes (Signed)
eLink Physician-Brief Progress Note Patient Name: Laura MelnickMelody Chavira DOB: 1946/04/19 MRN: [id number]   Date of Service  08/05/2015  HPI/Events of Note  Patient is on AC/HS moderate Novolog SSI. Patient is on enteral nutrition and not eating meals.   eICU Interventions  Will order: 1. D/C AC/HS moderate Novolog SSI. 2. Q 4 hour moderate Novolog SSI.      Intervention Category Intermediate Interventions: Hyperglycemia - evaluation and treatment  Sommer,Steven Dennard Nipugene 08/05/2015, 9:55 PM

## 2015-08-06 ENCOUNTER — Encounter (HOSPITAL_COMMUNITY): Payer: Self-pay | Admitting: Radiology

## 2015-08-06 ENCOUNTER — Inpatient Hospital Stay (HOSPITAL_COMMUNITY): Payer: Medicare Other

## 2015-08-06 LAB — BASIC METABOLIC PANEL
Anion gap: 5 (ref 5–15)
Anion gap: 6 (ref 5–15)
BUN: 34 mg/dL — AB (ref 6–20)
BUN: 32 mg/dL — AB (ref 6–20)
CALCIUM: 7.9 mg/dL — AB (ref 8.9–10.3)
CHLORIDE: 105 mmol/L (ref 101–111)
CO2: 28 mmol/L (ref 22–32)
CO2: 30 mmol/L (ref 22–32)
CREATININE: 1.03 mg/dL — AB (ref 0.44–1.00)
Calcium: 8.2 mg/dL — ABNORMAL LOW (ref 8.9–10.3)
Chloride: 107 mmol/L (ref 101–111)
Creatinine, Ser: 1.03 mg/dL — ABNORMAL HIGH (ref 0.44–1.00)
GFR calc Af Amer: >60 mL/min (ref >60)
GFR calc Af Amer: >60 mL/min (ref >60)
GFR calc non Af Amer: 55 mL/min — ABNORMAL LOW (ref >60)
GFR, EST NON AFRICAN AMERICAN: 55 mL/min — AB (ref >60)
GLUCOSE: 195 mg/dL — AB (ref 65–99)
GLUCOSE: 114 mg/dL — AB (ref 65–99)
POTASSIUM: 5.5 mmol/L — AB (ref 3.5–5.1)
POTASSIUM: 4.9 mmol/L (ref 3.5–5.1)
SODIUM: 140 mmol/L (ref 135–145)
Sodium: 141 mmol/L (ref 135–145)

## 2015-08-06 LAB — BLOOD GAS, ARTERIAL
ACID-BASE DEFICIT: 0.4 mmol/L (ref 0.0–2.0)
BICARBONATE: 25.4 meq/L — AB (ref 20.0–24.0)
DRAWN BY: 312761
FIO2: 0.4
O2 SAT: 93.8 %
PATIENT TEMPERATURE: 98.6
PCO2 ART: 54.9 mmHg — AB (ref 35.0–45.0)
PEEP: 5 cmH2O
PH ART: 7.287 — AB (ref 7.350–7.450)
PO2 ART: 70.6 mmHg — AB (ref 80.0–100.0)
Pressure support: 20 cmH2O
TCO2: 27.1 mmol/L (ref 0–100)

## 2015-08-06 LAB — CBC
HCT: 30.3 % — ABNORMAL LOW (ref 36.0–46.0)
Hemoglobin: 9.4 g/dL — ABNORMAL LOW (ref 12.0–15.0)
MCH: 30.7 pg (ref 26.0–34.0)
MCHC: 31 g/dL (ref 30.0–36.0)
MCV: 99 fL (ref 78.0–100.0)
PLATELETS: 196 10*3/uL (ref 150–400)
RBC: 3.06 MIL/uL — ABNORMAL LOW (ref 3.87–5.11)
RDW: 15.2 % (ref 11.5–15.5)
WBC: 11.4 10*3/uL — AB (ref 4.0–10.5)

## 2015-08-06 LAB — GLUCOSE, CAPILLARY
GLUCOSE-CAPILLARY: 96 mg/dL (ref 65–99)
GLUCOSE-CAPILLARY: 139 mg/dL — AB (ref 65–99)
Glucose-Capillary: 204 mg/dL — ABNORMAL HIGH (ref 65–99)
Glucose-Capillary: 117 mg/dL — ABNORMAL HIGH (ref 65–99)

## 2015-08-06 LAB — HEPARIN LEVEL (UNFRACTIONATED)
Heparin Unfractionated: 0.91 IU/mL — ABNORMAL HIGH (ref 0.30–0.70)
Heparin Unfractionated: 0.63 IU/mL (ref 0.30–0.70)

## 2015-08-06 LAB — POCT I-STAT 3, ART BLOOD GAS (G3+)
Acid-base deficit: 2 mmol/L (ref 0.0–2.0)
BICARBONATE: 27 meq/L — AB (ref 20.0–24.0)
O2 Saturation: 85 %
PCO2 ART: 63.7 mmHg — AB (ref 35.0–45.0)
Patient temperature: 99.3
TCO2: 29 mmol/L (ref 0–100)
pH, Arterial: 7.237 — ABNORMAL LOW (ref 7.350–7.450)
pO2, Arterial: 61 mmHg — ABNORMAL LOW (ref 80.0–100.0)

## 2015-08-06 LAB — CULTURE, BLOOD (ROUTINE X 2)
CULTURE: NO GROWTH
CULTURE: NO GROWTH

## 2015-08-06 LAB — MAGNESIUM: MAGNESIUM: 1.9 mg/dL (ref 1.7–2.4)

## 2015-08-06 LAB — PHOSPHORUS: PHOSPHORUS: 3.6 mg/dL (ref 2.5–4.6)

## 2015-08-06 MED ORDER — IOHEXOL 350 MG/ML SOLN
80.0000 mL | Freq: Once | INTRAVENOUS | Status: AC | PRN
  Administered 2015-08-06: 80 mL via INTRAVENOUS

## 2015-08-06 MED ORDER — BUDESONIDE 0.25 MG/2ML IN SUSP
0.2500 mg | Freq: Two times a day (BID) | RESPIRATORY_TRACT | Status: DC
Start: 2015-08-06 — End: 2015-08-11
  Administered 2015-08-06 – 2015-08-10 (×9): 0.25 mg via RESPIRATORY_TRACT
  Filled 2015-08-06 (×14): qty 2

## 2015-08-06 MED ORDER — MAGNESIUM SULFATE 2 GM/50ML IV SOLN
2.0000 g | Freq: Once | INTRAVENOUS | Status: AC
  Administered 2015-08-06: 2 g via INTRAVENOUS
  Filled 2015-08-06: qty 50

## 2015-08-06 MED ORDER — VECURONIUM BROMIDE 10 MG IV SOLR: 5.0000 mg | Freq: Four times a day (QID) | INTRAVENOUS | Status: DC | PRN

## 2015-08-06 MED ORDER — METHYLPREDNISOLONE SODIUM SUCC 125 MG IJ SOLR
80.0000 mg | Freq: Three times a day (TID) | INTRAMUSCULAR | Status: DC
  Administered 2015-08-06 – 2015-08-07 (×4): 80 mg via INTRAVENOUS
  Administered 2015-08-07: 1.28 mg via INTRAVENOUS
  Administered 2015-08-07 – 2015-08-11 (×11): 80 mg via INTRAVENOUS
  Filled 2015-08-06 (×3): qty 1.28
  Filled 2015-08-06: qty 2
  Filled 2015-08-06 (×4): qty 1.28
  Filled 2015-08-06: qty 2
  Filled 2015-08-06 (×6): qty 1.28
  Filled 2015-08-06: qty 2
  Filled 2015-08-06 (×3): qty 1.28

## 2015-08-06 MED ORDER — SODIUM CHLORIDE 0.9 % IV SOLN: INTRAVENOUS | Status: DC

## 2015-08-06 MED ORDER — VECURONIUM BROMIDE 10 MG IV SOLR: 5.0000 mg | Freq: Once | INTRAVENOUS | Status: DC

## 2015-08-06 NOTE — Progress Notes (Signed)
ANTICOAGULATION CONSULT NOTE - Follow Up Consult  Pharmacy Consult for heparin Indication: DVT  Allergies  Allergen Reactions  . Tramadol     memory issues  . Boniva [Ibandronic Acid]     Cough, achy    Patient Measurements: Height: 5' (152.4 cm) Weight: 162 lb 14.7 oz (73.9 kg) IBW/kg (Calculated) : 45.5 Heparin Dosing Weight:   Vital Signs: Temp: 99.3 F (37.4 C) (10/22 1217) Temp Source: Oral (10/22 1217) BP: 152/70 mmHg (10/22 1300) Pulse Rate: 90 (10/22 1300)  Labs:  Recent Labs  08/04/15 0830 08/04/15 1943 08/05/15 0307 08/05/15 2241 08/06/15 0530 08/06/15 1453  HGB 9.8*  --  9.6*  --  9.4*  --   HCT 29.9*  --  30.6*  --  30.3*  --   PLT 237  --  211  --  196  --   HEPARINUNFRC  --   --   --  0.77* 0.91* 0.63  CREATININE 1.08*  --  1.18*  --  1.03* 1.03*  TROPONINI  --  <0.03 0.03  --   --   --     Estimated Creatinine Clearance: 47 mL/min (by C-G formula based on Cr of 1.03).   Medications:  Scheduled:  . antiseptic oral rinse  7 mL Mouth Rinse 10 times per day  . budesonide  0.25 mg Nebulization BID  . chlorhexidine gluconate  15 mL Mouth Rinse BID  . insulin aspart  0-15 Units Subcutaneous 6 times per day  . ipratropium-albuterol  3 mL Nebulization Q4H  . methylPREDNISolone (SOLU-MEDROL) injection  80 mg Intravenous 3 times per day  . pantoprazole sodium  40 mg Per Tube Q24H  . piperacillin-tazobactam (ZOSYN)  IV  3.375 g Intravenous 3 times per day  . vancomycin  500 mg Intravenous Q12H   Infusions:  . sodium chloride    . feeding supplement (VITAL AF 1.2 CAL) 1,000 mL (08/06/15 0106)  . fentaNYL infusion INTRAVENOUS 250 mcg/hr (08/06/15 0900)  . heparin 900 Units/hr (08/06/15 1437)  . midazolam (VERSED) infusion 4 mg/hr (08/06/15 1011)    Assessment: 69 yo female with DVT is currently on therapeutic heparin.  Heparin level is 0.63.   Goal of Therapy:  Heparin level 0.3-0.7 units/ml Monitor platelets by anticoagulation protocol: Yes    Plan:  - continue heparin at 900 units/hr - heparin level in 8 hours to confirm  Batsheva Stevick, Tsz-Yin 08/06/2015,3:47 PM

## 2015-08-06 NOTE — Progress Notes (Addendum)
PULMONARY / CRITICAL CARE MEDICINE   Name: [name] MRN: [id number] DOB: 1946-09-08    ADMISSION DATE:  07/16/2015 CONSULTATION DATE:  07/31/2015  REFERRING MD :  EDP  CHIEF COMPLAINT:  Altered Mental Status  INITIAL PRESENTATION: 69 yo former smoker with altered mental status for 1 day from AKI, sepsis, acute on chronic hypoxic/hypercapnic respiratory failure. Has hx of COPD and followed by Dr. Melvyn Novas in pulmonary office.  STUDIES:  PFT 06/07/13 >> FEV1 1.38 (65%), FEV1% 50, TLC 5.45 (118%), DLCO 49%, no BD  SIGNIFICANT EVENTS: 10/17 admit 08/05/15: Afebrile. Breath stacking on vent. LE dopplers ordered   SUBJECTIVE/OVERNIGHT/INTERVAL HX 08/06/15: Duplex lower extremity yesterday positive for bilateral DVT. Started on IV heparin. Echocardiogram shows elevated pulmonary artery systolic pressure but no RV dilation. Left ventricular ejection fraction is fine. She is now on fentanyl and Precedex. Pressure support SIMV is the best ventilator mode without breath  stacking. This morning while attempting transfer for CT angiogram chest had worsening breath stacking and in transport had to be withheld.  Husband walked in this morning while patient was being wheeled out of the ICU. On the hallway he got upset and shouted out expletive   VITAL SIGNS: Temp:  [98 F (36.7 C)-99.8 F (37.7 C)] 99.3 F (37.4 C) (10/22 0840) Pulse Rate:  [74-101] 85 (10/22 0800) Resp:  [11-18] 14 (10/22 0800) BP: (97-156)/(50-85) 115/85 mmHg (10/22 0800) SpO2:  [91 %-100 %] 91 % (10/22 0800) FiO2 (%):  [40 %] 40 % (10/22 0800) Weight:  [73.9 kg (162 lb 14.7 oz)] 73.9 kg (162 lb 14.7 oz) (10/22 0200) HEMODYNAMICS:   VENTILATOR SETTINGS: Vent Mode:  [-] CPAP;PSV FiO2 (%):  [40 %] 40 % Set Rate:  [8 bmp] 8 bmp Vt Set:  [450 mL] 450 mL PEEP:  [5 cmH20] 5 cmH20 Pressure Support:  [15 cmH20-20 cmH20] 20 cmH20 Plateau Pressure:  [14 cmH20-17 cmH20] 17 cmH20 INTAKE / OUTPUT:  Intake/Output Summary  (Last 24 hours) at 08/06/15 0941 Last data filed at 08/06/15 0900  Gross per 24 hour  Intake 5408.8 ml  Output   2550 ml  Net 2858.8 ml    PHYSICAL EXAMINATION: General: NAD, chronically ill appearing female Neuro: sedated. RASS -2/-3 on fentanyl and Precedex with significant breath stacking  HEENT: Pupils reactive, ETT in place, surgical scar over anterior neck area Cardiovascular: RRR, no murmur Lungs: Decreased BS, bilateral significant wheezing present. Is breath stacking . Wheezing is WORSE Abdomen: Soft, non tender, decreased BS Musculoskeletal: No edema Skin: No rashes  LABS:  CBC  Recent Labs Lab 08/04/15 0830 08/05/15 0307 08/06/15 0530  WBC 8.3 8.7 11.4*  HGB 9.8* 9.6* 9.4*  HCT 29.9* 30.6* 30.3*  PLT 237 211 196   Coag's No results for input(s): APTT, INR in the last 168 hours. BMET  Recent Labs Lab 08/04/15 0830 08/05/15 0307 08/06/15 0530  NA 147* 143 140  K 4.0 5.3* 5.5*  CL 114* 109 107  CO2 24 25 28   BUN 25* 32* 34*  CREATININE 1.08* 1.18* 1.03*  GLUCOSE 168* 157* 195*   Electrolytes  Recent Labs Lab 08/04/15 0830 08/05/15 0307 08/06/15 0530  CALCIUM 8.1* 7.6* 7.9*  MG 2.0 2.1 1.9  PHOS 2.0* 4.2 3.6   Sepsis Markers  Recent Labs Lab 08/02/15 0230 08/02/15 0745 08/03/15 0250 08/04/15 1158 08/05/15 0307  LATICACIDVEN 0.7 1.8  --   --  1.1  PROCALCITON  --   --  11.43 4.95 3.05   ABG  Recent Labs  Lab 08/04/15 1801 08/05/15 0420 08/06/15 0325  PHART 7.212* 7.233* 7.287*  PCO2ART 66.3* 59.5* 54.9*  PO2ART 85.0 80.2 70.6*   Liver Enzymes  Recent Labs Lab 08/02/15 0104  AST 24  ALT 15  ALKPHOS 74  BILITOT 1.7*  ALBUMIN 3.1*   Cardiac Enzymes  Recent Labs Lab 08/02/15 2310 08/04/15 1943 08/05/15 0307  TROPONINI <0.03 <0.03 0.03   Glucose  Recent Labs Lab 08/05/15 0356 08/05/15 0817 08/05/15 1154 08/05/15 1622 08/05/15 1938 08/06/15 0838  GLUCAP 134* 193* 139* 179* 127* 204*     Imaging Ct Chest Wo Contrast  08/05/2015  CLINICAL DATA:  69 year old female with respiratory failure. Evaluate for potential pulmonary infiltrates. EXAM: CT CHEST WITHOUT CONTRAST TECHNIQUE: Multidetector CT imaging of the chest was performed following the standard protocol without IV contrast. COMPARISON:  Chest CT 12/15/2014. FINDINGS: Mediastinum/Lymph Nodes: Patient is intubated, with the tip of the endotracheal tube approximately 2.6 cm above the carina. Nasogastric tube extends into the stomach) below the lower margin of the images). Heart size is normal. There is no significant pericardial fluid, thickening or pericardial calcification. There is atherosclerosis of the thoracic aorta, the great vessels of the mediastinum and the coronary arteries, including calcified atherosclerotic plaque in the left main, left anterior descending, left circumflex and right coronary arteries. No pathologically enlarged mediastinal or hilar lymph nodes. Please note that accurate exclusion of hilar adenopathy is limited on noncontrast CT scans. Esophagus is unremarkable in appearance. No axillary lymphadenopathy. Lungs/Pleura: Small right and trace left pleural effusions lying dependently. This is associated with some passive subsegmental atelectasis in the dependent portions of the lower lobes of the lungs bilaterally (right greater than left). No definite consolidative airspace disease. 5 mm left lower lobe pulmonary nodule (image 30 of series 3), unchanged compared to prior study 12/15/2014, and smaller when compared to more remote prior examinations dating back to at least 03/06/2013, presumably benign. Mild diffuse bronchial wall thickening with mild to moderate centrilobular emphysema. Upper Abdomen: Small volume of ascites adjacent to liver and spleen. Gallbladder appears distended, but is incompletely visualize. There is some intermediate attenuation material lying dependently in the gallbladder, likely  biliary sludge. Incompletely visualized low-attenuation lesion in the interpolar region of the left kidney measuring at least 4.5 cm in diameter, not characterized on today's noncontrast CT examination, but similar to the prior study and statistically likely a cyst. Musculoskeletal/Soft Tissues: Old compression fractures at T9 and T12 with post vertebroplasty changes at both levels, most severe at T9 where there is approximately 30% loss of anterior vertebral body height. There are no aggressive appearing lytic or blastic lesions noted in the visualized portions of the skeleton. IMPRESSION: 1. No consolidative airspace disease to suggest pneumonia. 2. Small right and trace left pleural effusions with some areas of passive atelectasis in the lung bases bilaterally. 3. Atherosclerosis, including left main and 3 vessel coronary artery disease. Please note that although the presence of coronary artery calcium documents the presence of coronary artery disease, the severity of this disease and any potential stenosis cannot be assessed on this non-gated CT examination. Assessment for potential risk factor modification, dietary therapy or pharmacologic therapy may be warranted, if clinically indicated. 4. Gallbladder appears distended, and has some dependent intermediate attenuation material which likely represents biliary sludge. If there is any clinical concern for acute cholecystitis, further evaluation with right upper quadrant abdominal ultrasound would be recommended at this time. 5. Additional incidental findings, as above. Electronically Signed   By: Vinnie Langton  M.D.   On: 08/05/2015 14:18   Dg Chest Port 1 View  08/06/2015  CLINICAL DATA:  Acute respiratory failure. EXAM: PORTABLE CHEST 1 VIEW COMPARISON:  Chest CT and radiograph 08/05/2015 FINDINGS: Endotracheal tube remains in place, with tip approximately 3.5 cm above the carina. Enteric tube courses into the upper abdomen with tip not imaged. The  patient is rotated to the right. The cardiomediastinal silhouette is unchanged given this patient rotation. There is a persistent small right pleural effusion. Left lateral costophrenic angle was incompletely imaged, limiting assessment for a left-sided pleural effusion as was seen on the CT. Mild left greater than right basilar opacities are stable to minimally increased from the prior radiographs and most compatible with atelectasis. No pneumothorax. IMPRESSION: Small right pleural effusion.  Bibasilar atelectasis. Electronically Signed   By: Logan Bores M.D.   On: 08/06/2015 08:04   Dg Chest Port 1 View  08/05/2015  CLINICAL DATA:  Acute respiratory failure EXAM: PORTABLE CHEST 1 VIEW COMPARISON:  08/04/2015 FINDINGS: Endotracheal tube and NG tube are unchanged. Minimal bibasilar opacities, likely atelectasis. Heart is normal size. No confluent opacities or effusions. IMPRESSION: Bibasilar atelectasis. Electronically Signed   By: Rolm Baptise M.D.   On: 08/05/2015 11:20     ASSESSMENT / PLAN:  PULMONARY ETT 10/17 >> A: Acute on chronic respiratory failure in setting of sepsis, AKI, and metabolic acidosis. 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    - Breath stacking continues. Desaturates easily at any wake up assessment. CT chest without contrast largely free of infiltrates except in the right base. Suspect might be having dead space ventilation due to both COPD baseline and high pretest probably ready pulmonary embolism  ( PA systolic is 60 but was 30 and 2010 ) and plus minus acute exacerbation COPD    P:  CT angiogram chest and more stable from a respiratory standpoint  - evaluate for submassive PE and possible indication for local lysis with EKOS cath - given extreme failure to wean   - Given normalization of creatinine good time to get CT angio chest  Full vent support Scheduled duoneb with prn albuterol Add Pulmicort twice a day    increase Solu-Medrol dose  Repeat ABG in AM CXR in AM  CARDIOVASCULAR A:  Hx of HTN.    - Duplex lower extremity positive for bilateral deep vein thrombosis 08/05/2015 and on IV heparin.  P:  Hold outpt verapamil Continue IV heparin with monitoring per pharmacy  RENAL A:  AKI in setting of sepsis and hypovolemia >> baseline creatinine 1.08 from 03/11/15. - Cr improving Lactic acidosis. - resolved Hypokalemia - resolved   -  creatinine 1.03 mg percent and baseline. Magnesium slightly below 2 mg percent  P:  Magnesium sulfate 2 g percent  Foley Continue IV fluids Monitor renal fx, urine outpt F/u AM BMET  GASTROINTESTINAL A:  Constipation. Nutrition. P:  Tube feeds Protonix for SUP Bowel regimen RUQ [place] due to biliary sludge on CT chest  HEMATOLOGIC A:  Leukocytosis. - improving Mild normocytic anemia - likely 2/2 dilution from IVF P:  F/u CBC IV heparin per cardiac section  INFECTIOUS  Blood 10/17 >> NGTD Sputum 10/17 >> NGTD Urine 10/17 >> NGTD Legionella Ag 10/17 >> Pneumococcal Ag 10/17 >> neg Influenza PCR 10/17 >> neg  Results for orders placed or performed during the hospital encounter of 07/18/2015  Urine culture     Status: None   Collection Time:  07/29/2015  9:27 PM  Result Value Ref Range Status   Specimen Description URINE, CATHETERIZED  Final   Special Requests NONE  Final   Culture NO GROWTH 1 DAY  Final   Report Status 08/02/2015 FINAL  Final  Blood Culture (routine x 2)     Status: None   Collection Time: 08/06/2015  9:38 PM  Result Value Ref Range Status   Specimen Description BLOOD LEFT HAND  Final   Special Requests BOTTLES DRAWN AEROBIC AND ANAEROBIC 5CC  Final   Culture NO GROWTH 5 DAYS  Final   Report Status 08/06/2015 FINAL  Final  Blood Culture (routine x 2)     Status: None   Collection Time: 08/14/2015  9:40 PM  Result Value Ref Range Status   Specimen Description BLOOD RIGHT HAND  Final   Special Requests BOTTLES  DRAWN AEROBIC AND ANAEROBIC 5CC  Final   Culture NO GROWTH 5 DAYS  Final   Report Status 08/06/2015 FINAL  Final  MRSA PCR Screening     Status: None   Collection Time: 08/02/15 12:49 AM  Result Value Ref Range Status   MRSA by PCR NEGATIVE NEGATIVE Final    Comment:        The GeneXpert MRSA Assay (FDA approved for NASAL specimens only), is one component of a comprehensive MRSA colonization surveillance program. It is not intended to diagnose MRSA infection nor to guide or monitor treatment for MRSA infections.   Culture, blood (routine x 2)     Status: None (Preliminary result)   Collection Time: 08/04/15 11:58 AM  Result Value Ref Range Status   Specimen Description BLOOD LEFT ARM  Final   Special Requests BOTTLES DRAWN AEROBIC ONLY [place]  Final   Culture NO GROWTH 2 DAYS  Final   Report Status PENDING  Incomplete  Culture, blood (routine x 2)     Status: None (Preliminary result)   Collection Time: 08/04/15 12:05 PM  Result Value Ref Range Status   Specimen Description BLOOD LEFT HAND  Final   Special Requests BOTTLES DRAWN AEROBIC AND ANAEROBIC 5CC  Final   Culture NO GROWTH 2 DAYS  Final   Report Status PENDING  Incomplete  Culture, Urine     Status: None   Collection Time: 08/04/15 12:26 PM  Result Value Ref Range Status   Specimen Description URINE, CATHETERIZED  Final   Special Requests NONE  Final   Culture NO GROWTH 1 DAY  Final   Report Status 08/05/2015 FINAL  Final  Culture, respiratory (NON-Expectorated)     Status: None (Preliminary result)   Collection Time: 08/04/15  1:07 PM  Result Value Ref Range Status   Specimen Description TRACHEAL ASPIRATE  Final   Special Requests NONE  Final   Gram Stain   Final    RARE WBC PRESENT,BOTH PMN AND MONONUCLEAR RARE SQUAMOUS EPITHELIAL CELLS PRESENT NO ORGANISMS SEEN Performed at [facility]    Culture   Final    FEW CANDIDA ALBICANS Performed at [facility]    Report Status PENDING   Incomplete     A:  Sepsis likely 2/2 UTI (given dirty UA), possibility of pulm source given COPD but CT chest 08/05/15 - essentially clear but ? RLL infiltrate   - afebrile. PCT down to 3  P:  CTX 10/17 >>10/20 Azithro 10/17 >>10/20  Vanc 10/20>>10/22 Zosyn 10/20>>    ENDOCRINE A:  Hx of Goiter. - TSH wnl P:  Follow CBGs  NEUROLOGIC A:  Acute encephalopathy 2nd to sepsis, respiratory failure, AKI.   - unable to do WUA due to extreme breath stacking   P:  RASS goal: -3 Precedex, fentanyl, versed gtt DC precedex Use vec prn   FAMILY  - Updates: husband updated at bedside 10/20   - Inter-disciplinary family meet or Palliative Care meeting due by:  Done, meeting with husband, RN, MD team, and pharmacy 10/18 and daily through 08/05/15. On 08/06/15 - husband was very angry when he saw patient being wheeled outside room and breath stacking and having to cancel trip. He got angry and used an expletive. Met with husband separately. Empathized with his anger. I asked him if he was unhappy with my care or any issues with my care and if he needed another doctor or any issues with our care (body language towards me has not been positive since 2nd interaction). He did not express one way or other anything. Did tell him that on 08/08/15 service changes. He wanted to ensure that I gave a good sign out. I did call his primary pulmonary doc Dr Melvyn Novas . He spoke to husband later. During this conversation husband might have expressed prior negative interaction with me in ? 2010 admission for patient. Have asked RN Charlotte Crumb to help with patient satisfaction interaction   GLOBAL Ensure good sedation and vent synchrony. Then CTA rule out PE (high pretest prob given rise in PASP since 2010 and difficulty weaning) If submassive PE then consider EKOS to help weaning off vent.  The patient is critically ill with multiple organ systems failure and requires high complexity decision  making for assessment and support, frequent evaluation and titration of therapies, application of advanced monitoring technologies and extensive interpretation of multiple databases.   Critical Care Time devoted to patient care services described in this note is  40  Minutes. This time reflects time of care of this signee Dr Brand Males. This critical care time does not reflect procedure time, or teaching time or supervisory time of PA/NP/Med student/Med Resident etc but could involve care discussion time    Dr. Brand Males, M.D., [facility].C.P Pulmonary and Critical Care Medicine Staff Physician [place] [facility] Pager: [phone], If no answer or between  15:00h - 7:00h: call [phone]  08/06/2015 9:41 AM

## 2015-08-06 NOTE — Progress Notes (Signed)
Updates  #1  Patient now RASS sedation score -3  With fentanyl and Versed. On pressure support SIMV. Stable but still with double breath stacking.   Recent Labs Lab 08/04/15 1337 08/04/15 1801 08/05/15 0420 08/06/15 0325 08/06/15 1211  PHART 7.270* 7.212* 7.233* 7.287* 7.237*  PCO2ART 57.3* 66.3* 59.5* 54.9* 63.7*  PO2ART 91.0 85.0 80.2 70.6* 61.0*  HCO3 26.2* 26.7* 24.2* 25.4* 27.0*  TCO2 28 29 26.0 27.1 29  O2SAT 95.0 93.0 95.0 93.8 85.0    ABG shows continued respiratory acidosis with hypoxemia.  Hemodynamically stable  PLAN -  Get CT angiogram chest and if large submassive PE consider local thrombolysis   #2)  Nurse Carla Zimmerman discussed with husband.  He expressed to her that he does not have confidence in Dr.  based on 2010 interaction  When apparently I had recommended strongly to pulmonary function test. After her discussion with him patient has been at this point less inclined to transfer out  An accepting of care. I later met with him and he apologized for his explosive behavior earlier today. He said that he still has some PTSD from [place]. I have now explained to him that patient likely has a submassive pulmonary embolism and along with COPD is in significant respiratory failure. IV heparin as a treatment. Explained to him about possible local thrombolysis if submassive PE is confirmed on CT. Explained to him about the risks of renal failure with CT angiogram. Also expand him that she is at high risk of cardiac arrest at any point in time in the ICU illness. He verbalized understanding and is in acceptance of the plan of care   Dr.  , M.D., F.C.C.P Pulmonary and Critical Care Medicine Staff Physician [facility] [place] Pager: [phone], If no answer or between  15:00h - 7:00h: call [phone]  08/06/2015 12:39 PM    

## 2015-08-06 NOTE — Progress Notes (Signed)
ANTICOAGULATION CONSULT NOTE - Follow Up Consult  Pharmacy Consult for heparin Indication: DVT   Labs:  Recent Labs  08/04/15 0830 08/04/15 1943 08/05/15 0307 08/05/15 2241 08/06/15 0530  HGB 9.8*  --  9.6*  --   --   HCT 29.9*  --  30.6*  --   --   PLT 237  --  211  --   --   HEPARINUNFRC  --   --   --  0.77* 0.91*  CREATININE 1.08*  --  1.18*  --   --   TROPONINI  --  <0.03 0.03  --   --      Assessment: 69yo female remains supratherapeutic on heparin with higher level despite rate decrease, drawn from opposite arm.  Goal of Therapy:  Heparin level 0.3-0.7 units/ml   Plan:  Will decrease heparin gtt by 3 units/kg/hr to 900 units/hr and check level in 8hr.  Vernard GamblesVeronda Damiana Berrian, PharmD, BCPS  08/06/2015,6:21 AM

## 2015-08-07 ENCOUNTER — Inpatient Hospital Stay (HOSPITAL_COMMUNITY): Payer: Medicare Other

## 2015-08-07 LAB — GLUCOSE, CAPILLARY
GLUCOSE-CAPILLARY: 178 mg/dL — AB (ref 65–99)
GLUCOSE-CAPILLARY: 184 mg/dL — AB (ref 65–99)
Glucose-Capillary: 159 mg/dL — ABNORMAL HIGH (ref 65–99)
Glucose-Capillary: 170 mg/dL — ABNORMAL HIGH (ref 65–99)

## 2015-08-07 LAB — RESPIRATORY VIRUS PANEL
Adenovirus: NEGATIVE
Influenza A: NEGATIVE
Influenza B: NEGATIVE
METAPNEUMOVIRUS: NEGATIVE
PARAINFLUENZA 1 A: NEGATIVE
PARAINFLUENZA 2 A: NEGATIVE
Parainfluenza 3: NEGATIVE
RESPIRATORY SYNCYTIAL VIRUS B: NEGATIVE
RHINOVIRUS: NEGATIVE
Respiratory Syncytial Virus A: NEGATIVE

## 2015-08-07 LAB — BASIC METABOLIC PANEL
ANION GAP: 6 (ref 5–15)
BUN: 33 mg/dL — ABNORMAL HIGH (ref 6–20)
CHLORIDE: 107 mmol/L (ref 101–111)
CO2: 28 mmol/L (ref 22–32)
CREATININE: 0.96 mg/dL (ref 0.44–1.00)
Calcium: 8.3 mg/dL — ABNORMAL LOW (ref 8.9–10.3)
GFR calc non Af Amer: 59 mL/min — ABNORMAL LOW (ref >60)
Glucose, Bld: 209 mg/dL — ABNORMAL HIGH (ref 65–99)
Potassium: 4.7 mmol/L (ref 3.5–5.1)
SODIUM: 141 mmol/L (ref 135–145)

## 2015-08-07 LAB — CBC
HCT: 31.5 % — ABNORMAL LOW (ref 36.0–46.0)
HEMOGLOBIN: 9.8 g/dL — AB (ref 12.0–15.0)
MCH: 30.1 pg (ref 26.0–34.0)
MCHC: 31.1 g/dL (ref 30.0–36.0)
MCV: 96.6 fL (ref 78.0–100.0)
PLATELETS: 256 10*3/uL (ref 150–400)
RBC: 3.26 MIL/uL — AB (ref 3.87–5.11)
RDW: 14.8 % (ref 11.5–15.5)
WBC: 15 10*3/uL — AB (ref 4.0–10.5)

## 2015-08-07 LAB — CULTURE, RESPIRATORY W GRAM STAIN

## 2015-08-07 LAB — HEPATIC FUNCTION PANEL
ALT: 45 U/L (ref 14–54)
AST: 35 U/L (ref 15–41)
Albumin: 2.1 g/dL — ABNORMAL LOW (ref 3.5–5.0)
Alkaline Phosphatase: 51 U/L (ref 38–126)
BILIRUBIN TOTAL: 0.6 mg/dL (ref 0.3–1.2)
Bilirubin, Direct: <0.1 mg/dL — ABNORMAL LOW (ref 0.1–0.5)
TOTAL PROTEIN: 5.1 g/dL — AB (ref 6.5–8.1)

## 2015-08-07 LAB — PHOSPHORUS: Phosphorus: 3.1 mg/dL (ref 2.5–4.6)

## 2015-08-07 LAB — HEPARIN LEVEL (UNFRACTIONATED): HEPARIN UNFRACTIONATED: 0.44 [IU]/mL (ref 0.30–0.70)

## 2015-08-07 LAB — CULTURE, RESPIRATORY

## 2015-08-07 LAB — MAGNESIUM: MAGNESIUM: 2.2 mg/dL (ref 1.7–2.4)

## 2015-08-07 NOTE — Progress Notes (Signed)
PULMONARY / CRITICAL CARE MEDICINE   Name: [name] MRN: [id number] DOB: 1946-06-01    ADMISSION DATE:  07/21/2015 CONSULTATION DATE:  07/31/2015  REFERRING MD :  EDP  CHIEF COMPLAINT:  Altered Mental Status  INITIAL PRESENTATION: 69 yo former smoker with altered mental status for 1 day from AKI, sepsis, acute on chronic hypoxic/hypercapnic respiratory failure. Has hx of COPD and followed by Dr. Melvyn Novas in pulmonary office.  STUDIES:  PFT 06/07/13 >> FEV1 1.38 (65%), FEV1% 50, TLC 5.45 (118%), DLCO 49%, no BD  SIGNIFICANT EVENTS: 10/17 admit  08/05/15: Afebrile. Breath stacking on vent. LE dopplers ordered  08/06/15: Duplex lower extremity yesterday positive for bilateral DVT. Started on IV heparin. Echocardiogram shows elevated pulmonary artery systolic pressure but no RV dilation. Left ventricular ejection fraction is fine. She is now on fentanyl and Precedex. Pressure support SIMV is the best ventilator mode without breath  stacking. This morning while attempting transfer for CT angiogram chest had worsening breath stacking and in transport had to be withheld. Husband walked in this morning while patient was being wheeled out of the ICU. On the hallway he got upset and shouted out expletive; later apologized after Admin RN did service recovery. Husband indicated negative interaction with Dr MR in 2010 over rec. To get PFT reportedly. CT angio    - negative for PE   SUBJECTIVE/OVERNIGHT/INTERVAL HX  08/07/15: Improved breath stacking and resolved wheezing after adjustments to steroids and bronchodilators yesterday and adding Versed drip. Wake up assessment in progress. CT angiogram negative for pulmonary embolism but does have 5 mm nodule  VITAL SIGNS: Temp:  [99.1 F (37.3 C)-99.8 F (37.7 C)] 99.8 F (37.7 C) (10/23 0329) Pulse Rate:  [62-107] 101 (10/23 0739) Resp:  [12-23] 15 (10/23 0739) BP: (101-153)/(48-97) 123/60 mmHg (10/23 0739) SpO2:  [93 %-100 %] 98 % (10/23  0739) FiO2 (%):  [40 %-50 %] 50 % (10/23 0739) Weight:  [74.3 kg (163 lb 12.8 oz)] 74.3 kg (163 lb 12.8 oz) (10/23 0500) HEMODYNAMICS:   VENTILATOR SETTINGS: Vent Mode:  [-] SIMV;PSV;PRVC FiO2 (%):  [40 %-50 %] 50 % Set Rate:  [10 bmp] 10 bmp Vt Set:  [400 mL] 400 mL PEEP:  [5 cmH20] 5 cmH20 Pressure Support:  [20 cmH20] 20 cmH20 Plateau Pressure:  [14 cmH20-16 cmH20] 14 cmH20 INTAKE / OUTPUT:  Intake/Output Summary (Last 24 hours) at 08/07/15 0837 Last data filed at 08/07/15 0827  Gross per 24 hour  Intake 3064.3 ml  Output   4520 ml  Net -1455.7 ml    PHYSICAL EXAMINATION: General: NAD, chronically ill appearing female Neuro: sedated. RASS -3 on fentanyl and Versed with much improved breath stacking  HEENT: Pupils reactive, ETT in place, surgical scar over anterior neck area Cardiovascular: RRR, no murmur Lungs: Decreased BS, wheezing is absent. Breath stacking is improved. Abdomen: Soft, non tender, decreased BS Musculoskeletal: No edema Skin: No rashes  LABS:  CBC  Recent Labs Lab 08/05/15 0307 08/06/15 0530 08/07/15 0255  WBC 8.7 11.4* 15.0*  HGB 9.6* 9.4* 9.8*  HCT 30.6* 30.3* 31.5*  PLT 211 196 256   Coag's No results for input(s): APTT, INR in the last 168 hours. BMET  Recent Labs Lab 08/06/15 0530 08/06/15 1453 08/07/15 0255  NA 140 141 141  K 5.5* 4.9 4.7  CL 107 105 107  CO2 28 30 28   BUN 34* 32* 33*  CREATININE 1.03* 1.03* 0.96  GLUCOSE 195* 114* 209*   Electrolytes  Recent Labs Lab 08/05/15  1443 08/06/15 0530 08/06/15 1453 08/07/15 0255  CALCIUM 7.6* 7.9* 8.2* 8.3*  MG 2.1 1.9  --  2.2  PHOS 4.2 3.6  --  3.1   Sepsis Markers  Recent Labs Lab 08/02/15 0230 08/02/15 0745 08/03/15 0250 08/04/15 1158 08/05/15 0307  LATICACIDVEN 0.7 1.8  --   --  1.1  PROCALCITON  --   --  11.43 4.95 3.05   ABG  Recent Labs Lab 08/05/15 0420 08/06/15 0325 08/06/15 1211  PHART 7.233* 7.287* 7.237*  PCO2ART 59.5* 54.9* 63.7*   PO2ART 80.2 70.6* 61.0*   Liver Enzymes  Recent Labs Lab 08/02/15 0104 08/07/15 0255  AST 24 35  ALT 15 45  ALKPHOS 74 51  BILITOT 1.7* 0.6  ALBUMIN 3.1* 2.1*   Cardiac Enzymes  Recent Labs Lab 08/02/15 2310 08/04/15 1943 08/05/15 0307  TROPONINI <0.03 <0.03 0.03   Glucose  Recent Labs Lab 08/05/15 1938 08/06/15 0838 08/06/15 1219 08/06/15 1632 08/06/15 1937 08/06/15 2340  GLUCAP 127* 204* 117* 96 139* 159*    Imaging Ct Angio Chest Pe W/cm &/or Wo Cm  08/06/2015  CLINICAL DATA:  Acute hypoxemia, respiratory failure, DVT. EXAM: CT ANGIOGRAPHY CHEST WITH CONTRAST TECHNIQUE: Multidetector CT imaging of the chest was performed using the standard protocol during bolus administration of intravenous contrast. Multiplanar CT image reconstructions and MIPs were obtained to evaluate the vascular anatomy. CONTRAST:  15m OMNIPAQUE IOHEXOL 350 MG/ML SOLN COMPARISON:  Unenhanced CT chest dated 08/05/2015 FINDINGS: No evidence of pulmonary embolism. Mediastinum/Nodes: The heart is at the upper limits of normal for size. No pericardial effusion. Coronary atherosclerosis. Atherosclerotic calcifications of the aortic arch. Small mediastinal lymph nodes, including an 11 mm short axis right hilar node (series 4/ image 51), likely reactive. Lungs/Pleura: Endotracheal tube terminates 8 mm above the carina. Patchy right lower lobe opacity, likely compressive atelectasis, less likely pneumonia. Small to moderate right and small left pleural effusions. 4 mm left lower lobe pulmonary nodule (series 5/image 67), improved from 2014. No frank interstitial edema. Underlying moderate centrilobular and paraseptal emphysematous changes. No pneumothorax. Upper abdomen: Visualized upper abdomen is notable for vascular calcifications, mild perihepatic ascites, an enteric tube terminating in the proximal stomach. Musculoskeletal: Degenerative changes of the visualized thoracolumbar spine. Prior vertebral  augmentation involving two lower thoracic vertebral bodies. Review of the MIP images confirms the above findings. IMPRESSION: No evidence of pulmonary embolism. Patchy right lower lobe opacity, likely compressive atelectasis, less likely pneumonia. Small to moderate right and small left pleural effusions. No frank interstitial edema. Endotracheal tube terminates 8 mm above the carina. Electronically Signed   By: SJulian HyM.D.   On: 08/06/2015 15:55   [place]domen Limited Ruq  08/06/2015  CLINICAL DATA:  Biliary sludge, hypertension, COPD EXAM: [place]DOMEN LIMITED - RIGHT UPPER QUADRANT COMPARISON:  None FINDINGS: Gallbladder: Distended gallbladder. No gallstones, sludge, gallbladder wall thickening, or pericholecystic fluid. Unable to assess for presence of a sonographic Murphy sign as patient is unresponsive. Common bile duct: Diameter: Normal caliber 4 mm diameter Liver: Normal appearance Small amount of RIGHT upper quadrant free fluid. Small RIGHT pleural effusion. IMPRESSION: Distended gallbladder without definite stones or sludge. Small amount of ascites and small RIGHT pleural effusion incidentally noted. Electronically Signed   By: MLavonia DanaM.D.   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  #  At [place] 07/22/2015  - Acute on chronic respiratory failure in setting of sepsis syndrome (culture negative(, AKI, and metabolic acidosis.  #current   -08/07/2015: Breath stacking improved. Wheezing resolved. Appears midazolam and increased bronchodilatation helped. CT and gram-negative for PE. Possible right lower lobe pneumonia present on CT.   P:  Start spontaneous breathing trial if she does well on wake up assessment but no extubation 08/07/2015  Full vent support otherwise Scheduled duoneb with prn albuterol Pulmicort twice a day   Higher  Solu-Medrol dose since 08/06/2015 to continue  CXR in AM  CARDIOVASCULAR A:  Hx of HTN.    - Duplex lower extremity positive for bilateral deep vein thrombosis 08/05/2015 and on IV heparin. CT angio chest negative for PE 08/06/2015  P:  Hold outpt verapamil Continue IV heparin with monitoring per pharmacy  RENAL A:  AKI in setting of sepsis and hypovolemia >> baseline creatinine 1.08 from 03/11/15. -Resolved 08/06/2015 Lactic acidosis. - resolved Hypokalemia - resolved   -  No major issues 08/07/2015   P:  Magnesium sulfate 2 g percent  Foley Continue IV fluids at [facility] Monitor renal fx, urine outpt F/u AM BMET  GASTROINTESTINAL A:  Constipation. Nutrition. Biliary sludge seen on CT chest 08/05/2015  P:  Tube feeds Protonix for SUP Bowel regimen RUQ [place] due to biliary sludge on CT chest - pending  HEMATOLOGIC A:  Leukocytosis. - improving Mild normocytic anemia - likely 2/2 dilution from IVF P:  F/u CBC IV heparin per cardiac section  INFECTIOUS  Blood 10/17 >> NGTD Sputum 10/17 >> NGTD Urine 10/17 >> NGTD Legionella Ag 10/17 >> Pneumococcal Ag 10/17 >> neg Influenza PCR 10/17 >> neg     A:  Sepsis likely 2/2 UTI (given dirty UA), possibility of pulm source given COPD but CT chest 08/05/15 - essentially clear but ? RLL infiltrate   - afebrile. PCT down to 3  P:  CTX 10/17 >>10/20 Azithro 10/17 >>10/20  Vanc 10/20>>10/22 Zosyn 10/20>> [stop date needs to be established]    ENDOCRINE A:  Hx of Goiter. - TSH wnl P:  Follow CBGs  NEUROLOGIC A:  Acute encephalopathy 2nd to sepsis, respiratory failure, AKI.   - unable to do WUA due to extreme breath stacking but this is some improvement 08/07/2015   P:  RASS goal: -3 other than wake up assessment  fentanyl, versed gtt DC precedex   FAMILY  - Updates: husband updated at bedside daily 07/24/2015 through 08/06/2015. Not present at this time 08/07/2015   -  Inter-disciplinary family meet or Palliative Care meeting due by:  Done, meeting with husband, RN, MD team, and pharmacy 10/18 and daily through 08/05/15. On 08/06/15 - husband was very angry when he saw patient being wheeled outside room and breath stacking and having to cancel trip. He got angry and used an expletive. Met with husband separately. Empathized with his anger. I asked him if he was unhappy with my care or any issues with my care and if he needed another doctor or any issues with our care (body language towards me has not been positive since 2nd interaction). He did not express one way or other anything. Did tell him that on 08/08/15 service changes. He wanted to ensure that I gave a good sign out. I did call his primary pulmonary doc Dr Melvyn Novas . He spoke to husband later. During this conversation husband might have expressed prior negative interaction with me in ? 2010 admission for patient.  RN Angela Nevin  Tacy Dura conversed with  Husband and he later apologized    GLOBAL Do wake up assessment and then try SBT  The patient is critically ill with multiple organ systems failure and requires high complexity decision making for assessment and support, frequent evaluation and titration of therapies, application of advanced monitoring technologies and extensive interpretation of multiple databases.   Critical Care Time devoted to patient care services described in this note is  40  Minutes. This time reflects time of care of this signee Dr Brand Males. This critical care time does not reflect procedure time, or teaching time or supervisory time of PA/NP/Med student/Med Resident etc but could involve care discussion time    Dr. Brand Males, M.D., [facility].C.P Pulmonary and Critical Care Medicine Staff Physician [facility] Pager: [phone], If no answer or between  15:00h - 7:00h: call [phone]  08/07/2015 8:37 AM

## 2015-08-07 NOTE — Progress Notes (Addendum)
ANTICOAGULATION CONSULT NOTE - Follow-up Consult  Pharmacy Consult for heparin Indication: Bilateral DVTs  Allergies  Allergen Reactions  . Tramadol     memory issues  . Boniva [Ibandronic Acid]     Cough, achy    Patient Measurements: Height: 5' (152.4 cm) Weight: 163 lb 12.8 oz (74.3 kg) IBW/kg (Calculated) : 45.5  Vital Signs: Temp: 101 F (38.3 C) (10/23 1205) Temp Source: Oral (10/23 1205) BP: 130/64 mmHg (10/23 1139) Pulse Rate: 97 (10/23 1139)  Labs:  Recent Labs  08/04/15 1943  08/05/15 0307  08/06/15 0530 08/06/15 1453 08/07/15 0255  HGB  --   < > 9.6*  --  9.4*  --  9.8*  HCT  --   --  30.6*  --  30.3*  --  31.5*  PLT  --   --  211  --  196  --  256  HEPARINUNFRC  --   --   --   < > 0.91* 0.63 0.44  CREATININE  --   < > 1.18*  --  1.03* 1.03* 0.96  TROPONINI <0.03  --  0.03  --   --   --   --   < > = values in this interval not displayed.  Estimated Creatinine Clearance: 50.5 mL/min (by C-G formula based on Cr of 0.96).   Assessment: 69 YOF on heparin for bilateral DVT's. Heparin level remains therapeutic on 900 units/hr but is trending down.  With VTE would prefer level to be in the upper end of the therapeutic range. Hemoglobin is stable, platelet count is improved. No bleeding noted.  She is on Day #6 of empiric antibiotics, currently Zosyn.  Her renal function is stable.  Goal of Therapy:  Heparin level 0.3-0.7 units/ml Monitor platelets by anticoagulation protocol: Yes   Plan:  Increase Heparin to 950 units/hr Daily heparin level and CBC Continue Zosyn 3.375gm IV q8h extended infusion Monitor for duration of therapy.  Estella HuskMichelle Buckley Bradly, Pharm.D., BCPS, AAHIVP Clinical Pharmacist Phone: [phone] or [phone] 08/07/2015, 12:22 PM

## 2015-08-08 ENCOUNTER — Inpatient Hospital Stay (HOSPITAL_COMMUNITY): Payer: Medicare Other

## 2015-08-08 LAB — POCT I-STAT 3, ART BLOOD GAS (G3+)
Acid-Base Excess: 6 mmol/L — ABNORMAL HIGH (ref 0.0–2.0)
Bicarbonate: 31.7 mEq/L — ABNORMAL HIGH (ref 20.0–24.0)
O2 SAT: 97 %
PCO2 ART: 55.9 mmHg — AB (ref 35.0–45.0)
PH ART: 7.371 (ref 7.350–7.450)
TCO2: 33 mmol/L (ref 0–100)
pO2, Arterial: 103 mmHg — ABNORMAL HIGH (ref 80.0–100.0)

## 2015-08-08 LAB — CBC
HEMATOCRIT: 31.9 % — AB (ref 36.0–46.0)
Hemoglobin: 9.6 g/dL — ABNORMAL LOW (ref 12.0–15.0)
MCH: 29.4 pg (ref 26.0–34.0)
MCHC: 30.1 g/dL (ref 30.0–36.0)
MCV: 97.6 fL (ref 78.0–100.0)
PLATELETS: 334 10*3/uL (ref 150–400)
RBC: 3.27 MIL/uL — ABNORMAL LOW (ref 3.87–5.11)
RDW: 14.9 % (ref 11.5–15.5)
WBC: 13.6 10*3/uL — AB (ref 4.0–10.5)

## 2015-08-08 LAB — GLUCOSE, CAPILLARY
GLUCOSE-CAPILLARY: 196 mg/dL — AB (ref 65–99)
GLUCOSE-CAPILLARY: 173 mg/dL — AB (ref 65–99)
GLUCOSE-CAPILLARY: 188 mg/dL — AB (ref 65–99)
GLUCOSE-CAPILLARY: 170 mg/dL — AB (ref 65–99)
Glucose-Capillary: 158 mg/dL — ABNORMAL HIGH (ref 65–99)
Glucose-Capillary: 164 mg/dL — ABNORMAL HIGH (ref 65–99)
Glucose-Capillary: 197 mg/dL — ABNORMAL HIGH (ref 65–99)

## 2015-08-08 LAB — BASIC METABOLIC PANEL
Anion gap: 9 (ref 5–15)
BUN: 40 mg/dL — AB (ref 6–20)
CO2: 29 mmol/L (ref 22–32)
CREATININE: 1.04 mg/dL — AB (ref 0.44–1.00)
Calcium: 8.6 mg/dL — ABNORMAL LOW (ref 8.9–10.3)
Chloride: 109 mmol/L (ref 101–111)
GFR, EST NON AFRICAN AMERICAN: 54 mL/min — AB (ref >60)
GLUCOSE: 192 mg/dL — AB (ref 65–99)
Potassium: 4.4 mmol/L (ref 3.5–5.1)
SODIUM: 147 mmol/L — AB (ref 135–145)

## 2015-08-08 LAB — MAGNESIUM: Magnesium: 2.1 mg/dL (ref 1.7–2.4)

## 2015-08-08 LAB — HEPARIN LEVEL (UNFRACTIONATED): HEPARIN UNFRACTIONATED: 0.43 [IU]/mL (ref 0.30–0.70)

## 2015-08-08 LAB — PHOSPHORUS: PHOSPHORUS: 3.2 mg/dL (ref 2.5–4.6)

## 2015-08-08 MED ORDER — MAGNESIUM HYDROXIDE 400 MG/5ML PO SUSP
15.0000 mL | Freq: Once | ORAL | Status: AC
Start: 2015-08-08 — End: 2015-08-08
  Administered 2015-08-08: 15 mL
  Filled 2015-08-08: qty 30

## 2015-08-08 MED ORDER — SENNOSIDES-DOCUSATE SODIUM 8.6-50 MG PO TABS
1.0000 | ORAL_TABLET | Freq: Two times a day (BID) | ORAL | Status: DC
  Administered 2015-08-08: 1
  Filled 2015-08-08 (×8): qty 1

## 2015-08-08 MED ORDER — FREE WATER
250.0000 mL | Freq: Four times a day (QID) | Status: DC
  Administered 2015-08-08 – 2015-08-11 (×12): 250 mL

## 2015-08-08 MED ORDER — VANCOMYCIN HCL IN DEXTROSE 750-5 MG/150ML-% IV SOLN
750.0000 mg | Freq: Two times a day (BID) | INTRAVENOUS | Status: DC
  Administered 2015-08-08 – 2015-08-10 (×6): 750 mg via INTRAVENOUS
  Filled 2015-08-08 (×8): qty 150

## 2015-08-08 MED ORDER — DOCUSATE SODIUM 50 MG/5ML PO LIQD: 100.0000 mg | Freq: Two times a day (BID) | ORAL | Status: DC

## 2015-08-08 NOTE — Procedures (Signed)
Central Venous Catheter Insertion Procedure Note Laura MelnickMelody [name] [id number] 08-27-1946  Procedure: Insertion of Central Venous Catheter Indications: Assessment of intravascular volume and Drug and/or fluid administration  Procedure Details Consent: Risks of procedure as well as the alternatives and risks of each were explained to the (patient/caregiver).  Consent for procedure obtained. Time Out: Verified patient identification, verified procedure, site/side was marked, verified correct patient position, special equipment/implants available, medications/allergies/relevent history reviewed, required imaging and test results available.  Performed  Maximum sterile technique was used including antiseptics, cap, gloves, gown, hand hygiene, mask and sheet. Skin prep: Chlorhexidine; local anesthetic administered A antimicrobial bonded/coated triple lumen catheter was placed in the left internal jugular vein using the Seldinger technique.  Evaluation Blood flow good Complications: No apparent complications Patient did tolerate procedure well. Chest X-ray ordered to verify placement.  CXR: pending.  U/S used in placement.  [name] 08/08/2015, 1:40 PM

## 2015-08-08 NOTE — Progress Notes (Signed)
ANTIBIOTIC CONSULT NOTE - INITIAL  Pharmacy Consult for Vancomycin restart Indication: rule out pneumonia  Allergies  Allergen Reactions  . Tramadol     memory issues  . Boniva [Ibandronic Acid]     Cough, achy   Patient Measurements: Height: 5' (152.4 cm) Weight: 165 lb 12.6 oz (75.2 kg) IBW/kg (Calculated) : 45.5 Vital Signs: Temp: 102 F (38.9 C) (10/24 0745) Temp Source: Oral (10/24 0745) BP: 121/49 mmHg (10/24 1300) Pulse Rate: 74 (10/24 1300) Intake/Output from previous day: 10/23 0701 - 10/24 0700 In: 2501.8 [I.V.:876.8; NG/GT:1500; IV Piggyback:125] Out: 2725 [Urine:2725] Intake/Output from this shift: Total I/O In: 736.5 [I.V.:224; NG/GT:300; IV Piggyback:212.5] Out: 630 [Urine:630]  Labs:  Recent Labs  08/06/15 0530 08/06/15 1453 08/07/15 0255 08/08/15 0232  WBC 11.4*  --  15.0* 13.6*  HGB 9.4*  --  9.8* 9.6*  PLT 196  --  256 334  CREATININE 1.03* 1.03* 0.96 1.04*   Estimated Creatinine Clearance: 46.9 mL/min (by C-G formula based on Cr of 1.04). No results for input(s): VANCOTROUGH, VANCOPEAK, VANCORANDOM, GENTTROUGH, GENTPEAK, GENTRANDOM, TOBRATROUGH, TOBRAPEAK, TOBRARND, AMIKACINPEAK, AMIKACINTROU, AMIKACIN in the last 72 hours.   Medications:  Anti-infectives    Start     Dose/Rate Route Frequency Ordered Stop   08/08/15 1100  vancomycin (VANCOCIN) IVPB 750 mg/150 ml premix     750 mg 150 mL/hr over 60 Minutes Intravenous Every 12 hours 08/08/15 1053     08/04/15 1400  piperacillin-tazobactam (ZOSYN) IVPB 3.375 g     3.375 g 12.5 mL/hr over 240 Minutes Intravenous 3 times per day 08/04/15 1208     08/04/15 1215  vancomycin (VANCOCIN) 500 mg in sodium chloride 0.9 % 100 mL IVPB  Status:  Discontinued     500 mg 100 mL/hr over 60 Minutes Intravenous Every 12 hours 08/04/15 1207 08/07/15 1216   08/02/15 0430  piperacillin-tazobactam (ZOSYN) IVPB 3.375 g  Status:  Discontinued     3.375 g 12.5 mL/hr over 240 Minutes Intravenous 3 times per day  08/04/2015 2214 07/18/2015 2218   07/29/2015 2330  cefTRIAXone (ROCEPHIN) 1 g in dextrose 5 % 50 mL IVPB  Status:  Discontinued     1 g 100 mL/hr over 30 Minutes Intravenous Every 24 hours 07/16/2015 2327 08/04/15 1201   08/10/2015 2330  azithromycin (ZITHROMAX) 500 mg in dextrose 5 % 250 mL IVPB  Status:  Discontinued     500 mg 250 mL/hr over 60 Minutes Intravenous Every 24 hours 07/19/2015 2327 08/04/15 1201   07/31/2015 2215  piperacillin-tazobactam (ZOSYN) IVPB 3.375 g     3.375 g 100 mL/hr over 30 Minutes Intravenous  Once 08/07/2015 2209 07/23/2015 2241   07/26/2015 2215  vancomycin (VANCOCIN) 1,250 mg in sodium chloride 0.9 % 250 mL IVPB  Status:  Discontinued     1,250 mg 166.7 mL/hr over 90 Minutes Intravenous  Once 08/05/2015 2210 07/28/2015 2327     Assessment: 69 year old female on Zosyn, now to resume vancomycin for rule out pneumonia and persistent fever. WBC is trending down. LA has normalized. This day #7 of antibiotics. SCr is 1.04 -- likely back to baseline.   10/20 Zosyn >> 10/20 Vanc >>10/23; 10/24 >> 10/17 zithromax >> 10/20 10/17 CTX >> 10/20  Sputum 10/20 >>few candida albicans UC 10/20 >>neg Blood 10/20 >>ngtd RVP 10/20: neg Urine 10/17: neg Blood 10/17 >> neg Legionella Ag 10/17 >>neg Pneumococcal Ag 10/17 >>negF Influenza PCR 10/17 >>negF  Goal of Therapy:  Vancomycin trough level 15-20 mcg/ml  Plan:  Restart vancomycin at 750 mg IV every 12 hours.  Monitor clinical status, renal function, and culture results.  Check vancomycin level as appropriate at steady state.   Link Snuffer, PharmD, BCPS Clinical Pharmacist [phone] 08/08/2015,1:56 PM

## 2015-08-08 NOTE — Progress Notes (Addendum)
ANTICOAGULATION CONSULT NOTE - Follow-up Consult  Pharmacy Consult for heparin Indication: Bilateral DVTs  Allergies  Allergen Reactions  . Tramadol     memory issues  . Boniva [Ibandronic Acid]     Cough, achy    Patient Measurements: Height: 5' (152.4 cm) Weight: 165 lb 12.6 oz (75.2 kg) IBW/kg (Calculated) : 45.5  Vital Signs: Temp: 102 F (38.9 C) (10/24 0745) Temp Source: Oral (10/24 0745) BP: 117/62 mmHg (10/24 0630) Pulse Rate: 77 (10/24 0700)  Labs:  Recent Labs  08/06/15 0530 08/06/15 1453 08/07/15 0255  HGB 9.4*  --  9.8*  HCT 30.3*  --  31.5*  PLT 196  --  256  HEPARINUNFRC 0.91* 0.63 0.44  CREATININE 1.03* 1.03* 0.96   Called Lab- Tubed Labs for 10/24: Hgb 9.6, Plts 334, Heparin level 0.43, SCr 1.04  Estimated Creatinine Clearance: 50.8 mL/min (by C-G formula based on Cr of 0.96).   Assessment: 69 YOF on heparin for bilateral DVT's. Heparin level remains therapeutic on 950 units/hr but is still trending down.  With VTE would prefer level to be in the upper end of the therapeutic range. Hemoglobin is stable, platelet count is improved. No bleeding noted. Renal function back to baseline.    Goal of Therapy:  Heparin level 0.3-0.7 units/ml Monitor platelets by anticoagulation protocol: Yes   Plan:  Increase Heparin to 1000 units/hr to keep in range Daily heparin level and CBC  Link SnufferJessica Camielle Sizer, PharmD, BCPS Clinical Pharmacist [phone]  08/08/2015, 8:24 AM  Heparin held for CVC placement. Heparin was resumed at 1300 PM. No need for further levels or adjustments.  JBM, 1:53 PM

## 2015-08-08 NOTE — Progress Notes (Signed)
PULMONARY / CRITICAL CARE MEDICINE   Name: Brandon MelnickMelody Filter MRN: [id number] DOB: [birth year]    ADMISSION DATE:  07/31/2015 CONSULTATION DATE:  08/14/2015  REFERRING MD :  EDP  CHIEF COMPLAINT:  Altered Mental Status  INITIAL PRESENTATION: 69 yo former smoker with altered mental status for 1 day from AKI, sepsis, acute on chronic hypoxic/hypercapnic respiratory failure. Has hx of COPD and followed by Dr. Sherene SiresWert in pulmonary office.  STUDIES:  PFT 06/07/13 >> FEV1 1.38 (65%), FEV1% 50, TLC 5.45 (118%), DLCO 49%, no BD  SIGNIFICANT EVENTS: 10/17 admit  08/05/15: Afebrile. Breath stacking on vent. LE dopplers ordered  08/06/15: Duplex lower extremity yesterday positive for bilateral DVT. Started on IV heparin. Echocardiogram shows elevated pulmonary artery systolic pressure but no RV dilation. Left ventricular ejection fraction is fine. She is now on fentanyl and Precedex. Pressure support SIMV is the best ventilator mode without breath  stacking. This morning while attempting transfer for CT angiogram chest had worsening breath stacking and in transport had to be withheld. Husband walked in this morning while patient was being wheeled out of the ICU. On the hallway he got upset and shouted out expletive; later apologized after Admin RN did service recovery. Husband indicated negative interaction with Dr MR in 2010 over rec. To get PFT reportedly. CT angio    - negative for PE   SUBJECTIVE/OVERNIGHT/INTERVAL HX  Improved breath stacking. No BM since admission.  VITAL SIGNS: Temp:  [99.9 F (37.7 C)-102 F (38.9 C)] 102 F (38.9 C) (10/24 0745) Pulse Rate:  [49-115] 75 (10/24 0900) Resp:  [10-25] 12 (10/24 0900) BP: (112-153)/(43-76) 132/66 mmHg (10/24 0900) SpO2:  [96 %-98 %] 98 % (10/24 0900) FiO2 (%):  [50 %] 50 % (10/24 1000) Weight:  [75.2 kg (165 lb 12.6 oz)] 75.2 kg (165 lb 12.6 oz) (10/24 0412) HEMODYNAMICS:   VENTILATOR SETTINGS: Vent Mode:  [-] SIMV/PC/PS FiO2 (%):  [50 %]  50 % Set Rate:  [10 bmp] 10 bmp Vt Set:  [400 mL] 400 mL PEEP:  [5 cmH20] 5 cmH20 Pressure Support:  [20 cmH20] 20 cmH20 Plateau Pressure:  [12 cmH20-25 cmH20] 12 cmH20 INTAKE / OUTPUT:  Intake/Output Summary (Last 24 hours) at 08/08/15 1036 Last data filed at 08/08/15 1000  Gross per 24 hour  Intake 2564.83 ml  Output   2510 ml  Net  54.83 ml   PHYSICAL EXAMINATION: General: NAD, chronically ill appearing female Neuro: sedated. RASS -3 on fentanyl and Versed with much improved breath stacking  HEENT: Pupils reactive, ETT in place, surgical scar over anterior neck area Cardiovascular: RRR, no murmur Lungs: Decreased BS, wheezing R>L. Breath stacking is improved. Abdomen: Soft, non tender, decreased BS Musculoskeletal: No edema Skin: No rashes  LABS:  CBC  Recent Labs Lab 08/06/15 0530 08/07/15 0255 08/08/15 0232  WBC 11.4* 15.0* 13.6*  HGB 9.4* 9.8* 9.6*  HCT 30.3* 31.5* 31.9*  PLT 196 256 334   Coag's No results for input(s): APTT, INR in the last 168 hours. BMET  Recent Labs Lab 08/06/15 1453 08/07/15 0255 08/08/15 0232  NA 141 141 147*  K 4.9 4.7 4.4  CL 105 107 109  CO2 30 28 29   BUN 32* 33* 40*  CREATININE 1.03* 0.96 1.04*  GLUCOSE 114* 209* 192*   Electrolytes  Recent Labs Lab 08/06/15 0530 08/06/15 1453 08/07/15 0255 08/08/15 0232  CALCIUM 7.9* 8.2* 8.3* 8.6*  MG 1.9  --  2.2 2.1  PHOS 3.6  --  3.1 3.2  Sepsis Markers  Recent Labs Lab 08/02/15 0230 08/02/15 0745 08/03/15 0250 08/04/15 1158 08/05/15 0307  LATICACIDVEN 0.7 1.8  --   --  1.1  PROCALCITON  --   --  11.43 4.95 3.05   ABG  Recent Labs Lab 08/05/15 0420 08/06/15 0325 08/06/15 1211  PHART 7.233* 7.287* 7.237*  PCO2ART 59.5* 54.9* 63.7*  PO2ART 80.2 70.6* 61.0*   Liver Enzymes  Recent Labs Lab 08/02/15 0104 08/07/15 0255  AST 24 35  ALT 15 45  ALKPHOS 74 51  BILITOT 1.7* 0.6  ALBUMIN 3.1* 2.1*   Cardiac Enzymes  Recent Labs Lab  08/02/15 2310 08/04/15 1943 08/05/15 0307  TROPONINI <0.03 <0.03 0.03   Glucose  Recent Labs Lab 08/07/15 0836 08/07/15 1208 08/07/15 1953 08/08/15 0002 08/08/15 0351 08/08/15 0744  GLUCAP 178* 170* 184* 158* 173* 197*   Imaging No results found.  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  #At Admit 08/12/2015  - Acute on chronic respiratory failure in setting of sepsis syndrome (culture negative(, AKI, and metabolic acidosis.  #current       - Breath stacking improved.  P:  Start spontaneous breathing trial if she does well on wake up assessment Change to PCV with f/u ABG. Scheduled duoneb with prn albuterol Pulmicort twice a day  Higher Solu-Medrol dose since 08/06/2015 to continue  CXR in AM  CARDIOVASCULAR A:  Hx of HTN.   - Duplex lower extremity positive for bilateral deep vein thrombosis 08/05/2015 and on IV heparin. CT angio chest negative for PE 08/06/2015  P:  Hold outpt verapamil Continue IV heparin with monitoring per pharmacy  RENAL A:  AKI in setting of sepsis and hypovolemia >> baseline creatinine 1.08 from 03/11/15. -Resolved 08/06/2015 Lactic acidosis. - resolved Hypokalemia - resolved  P:  Foley Continue IV fluids at [facility] Monitor renal fx, urine outpt F/u AM BMET Free water for hypernatremia.  GASTROINTESTINAL A:  Constipation. - no BM since admission per RN Nutrition. Biliary sludge seen on CT chest 08/05/2015  P:  Tube feeds Protonix for SUP Bowel regimen RUQ [place] due to biliary sludge on CT chest - pending  HEMATOLOGIC A:  Leukocytosis. - improving Mild normocytic anemia - likely 2/2 dilution from IVF P:  F/u CBC IV heparin per cardiac section  INFECTIOUS  Blood 10/17 >> NGTD Sputum 10/17 >> NGTD Urine 10/17 >> NGTD Legionella Ag 10/17 >> neg Pneumococcal Ag 10/17 >> neg Influenza  PCR 10/17 >> neg  A:  Sepsis likely 2/2 UTI (given dirty UA), possibility of pulm source given COPD but CT chest 08/05/15 - essentially clear but ? RLL infiltrate  - febrile. PCT down to 3 (10/23)  P:  CTX 10/17 >>10/20 Azithro 10/17 >>10/20  Vanc 10/20>>10/22>>>10/24>>> Zosyn 10/20>> [stop date needs to be established]  ENDOCRINE A:  Hx of Goiter. - TSH wnl P:  Follow CBGs  NEUROLOGIC A:  Acute encephalopathy 2nd to sepsis, respiratory failure, AKI.   P:  RASS goal: -3 other than wake up assessment Fentanyl, versed gtt  FAMILY  - Updates: husband updated at bedside daily 08/13/2015 through 08/06/2015. Not present at this time  Shirlee Latch, M.D., MPH PGY-2,  [facility] 08/08/2015 10:36 AM  Attending Note:  69 year old female with COPD history who is in respiratory failure due to COPD exacerbation and ?PNA.  Febrile overnight.  Will  restart vanc.  Patient is on SIMV with significant asynchrony on exam with R>L exp wheezes.  Will continue steroids, change to PCV to avoid breath stacking.  Will also check ABG post change.  Will need to discuss trach/peg with patient's husband when available today or tomorrow.  The patient is critically ill with multiple organ systems failure and requires high complexity decision making for assessment and support, frequent evaluation and titration of therapies, application of advanced monitoring technologies and extensive interpretation of multiple databases.   Critical Care Time devoted to patient care services described in this note is  35  Minutes. This time reflects time of care of this signee Dr Koren Bound. This critical care time does not reflect procedure time, or teaching time or supervisory time of PA/NP/Med student/Med Resident etc but could involve care discussion time.  Alyson Reedy, M.D. [facility] Pulmonary/Critical Care Medicine. Pager: [phone]. After hours pager: [phone].

## 2015-08-09 ENCOUNTER — Inpatient Hospital Stay (HOSPITAL_COMMUNITY): Payer: Medicare Other

## 2015-08-09 LAB — CBC
HEMATOCRIT: 33 % — AB (ref 36.0–46.0)
Hemoglobin: 9.9 g/dL — ABNORMAL LOW (ref 12.0–15.0)
MCH: 29.6 pg (ref 26.0–34.0)
MCHC: 30 g/dL (ref 30.0–36.0)
MCV: 98.8 fL (ref 78.0–100.0)
Platelets: 329 10*3/uL (ref 150–400)
RBC: 3.34 MIL/uL — AB (ref 3.87–5.11)
RDW: 14.6 % (ref 11.5–15.5)
WBC: 15.4 10*3/uL — AB (ref 4.0–10.5)

## 2015-08-09 LAB — BASIC METABOLIC PANEL
ANION GAP: 9 (ref 5–15)
BUN: 41 mg/dL — AB (ref 6–20)
CO2: 30 mmol/L (ref 22–32)
Calcium: 8.2 mg/dL — ABNORMAL LOW (ref 8.9–10.3)
Chloride: 109 mmol/L (ref 101–111)
Creatinine, Ser: 1.09 mg/dL — ABNORMAL HIGH (ref 0.44–1.00)
GFR calc Af Amer: 59 mL/min — ABNORMAL LOW (ref >60)
GFR calc non Af Amer: 51 mL/min — ABNORMAL LOW (ref >60)
GLUCOSE: 207 mg/dL — AB (ref 65–99)
POTASSIUM: 4.5 mmol/L (ref 3.5–5.1)
Sodium: 148 mmol/L — ABNORMAL HIGH (ref 135–145)

## 2015-08-09 LAB — BLOOD GAS, ARTERIAL
ACID-BASE EXCESS: 4.9 mmol/L — AB (ref 0.0–2.0)
Bicarbonate: 29.5 mEq/L — ABNORMAL HIGH (ref 20.0–24.0)
Drawn by: 437071
FIO2: 0.4
O2 Saturation: 94.7 %
PATIENT TEMPERATURE: 99.8
PCO2 ART: 50.4 mmHg — AB (ref 35.0–45.0)
PEEP/CPAP: 5 cmH2O
PH ART: 7.389 (ref 7.350–7.450)
PO2 ART: 77.7 mmHg — AB (ref 80.0–100.0)
PRESSURE CONTROL: 10 cmH2O
RATE: 12 resp/min
TCO2: 31 mmol/L (ref 0–100)

## 2015-08-09 LAB — CULTURE, BLOOD (ROUTINE X 2)
CULTURE: NO GROWTH
Culture: NO GROWTH

## 2015-08-09 LAB — MAGNESIUM: Magnesium: 2.3 mg/dL (ref 1.7–2.4)

## 2015-08-09 LAB — GLUCOSE, CAPILLARY
GLUCOSE-CAPILLARY: 192 mg/dL — AB (ref 65–99)
Glucose-Capillary: 185 mg/dL — ABNORMAL HIGH (ref 65–99)
Glucose-Capillary: 224 mg/dL — ABNORMAL HIGH (ref 65–99)

## 2015-08-09 LAB — HEPARIN LEVEL (UNFRACTIONATED): Heparin Unfractionated: 0.4 IU/mL (ref 0.30–0.70)

## 2015-08-09 LAB — PHOSPHORUS: Phosphorus: 3.2 mg/dL (ref 2.5–4.6)

## 2015-08-09 MED ORDER — DEXTROSE 5 % IV SOLN
5.0000 mg/h | INTRAVENOUS | Status: DC
  Filled 2015-08-09: qty 100

## 2015-08-09 MED ORDER — HYDRALAZINE HCL 20 MG/ML IJ SOLN
10.0000 mg | INTRAMUSCULAR | Status: DC | PRN
  Administered 2015-08-09: 20 mg via INTRAVENOUS
  Filled 2015-08-09: qty 1

## 2015-08-09 MED ORDER — ADENOSINE 6 MG/2ML IV SOLN
6.0000 mg | Freq: Once | INTRAVENOUS | Status: AC
  Administered 2015-08-09: 6 mg via INTRAVENOUS

## 2015-08-09 MED ORDER — ADENOSINE 6 MG/2ML IV SOLN
INTRAVENOUS | Status: AC
  Filled 2015-08-09: qty 2

## 2015-08-09 MED ORDER — ADENOSINE 6 MG/2ML IV SOLN
INTRAVENOUS | Status: AC
  Filled 2015-08-09: qty 4

## 2015-08-09 MED ORDER — VITAL AF 1.2 CAL PO LIQD
1000.0000 mL | ORAL | Status: DC
  Administered 2015-08-10: 1000 mL
  Filled 2015-08-09 (×4): qty 1000

## 2015-08-09 MED ORDER — DEXTROSE 5 % IV SOLN
5.0000 mg/h | INTRAVENOUS | Status: DC
  Administered 2015-08-09: 5 mg/h via INTRAVENOUS
  Filled 2015-08-09 (×2): qty 100

## 2015-08-09 NOTE — Progress Notes (Signed)
Switched patient to PCV per Dr. Craige CottaSood due to increased asynchrony on PRVC mode. Patient is tolerating this mode better, and RT will continue to monitor.

## 2015-08-09 NOTE — Progress Notes (Signed)
PULMONARY / CRITICAL CARE MEDICINE   Name: Brandon MelnickMelody Damiano MRN: [id number] DOB: 1945-11-10    ADMISSION DATE:  2015-10-12 CONSULTATION DATE:  10-03-2015  REFERRING MD :  EDP  CHIEF COMPLAINT:  Altered Mental Status  INITIAL PRESENTATION: 69 yo former smoker with altered mental status for 1 day from AKI, sepsis, acute on chronic hypoxic/hypercapnic respiratory failure. Has hx of COPD and followed by Dr. Sherene SiresWert in pulmonary office.  STUDIES:  PFT 06/07/13 >> FEV1 1.38 (65%), FEV1% 50, TLC 5.45 (118%), DLCO 49%, no BD  SIGNIFICANT EVENTS: 10/17 admit  08/05/15: Afebrile. Breath stacking on vent. LE dopplers ordered  08/06/15: Duplex lower extremity yesterday positive for bilateral DVT. Started on IV heparin. Echocardiogram shows elevated pulmonary artery systolic pressure but no RV dilation. Left ventricular ejection fraction is fine. She is now on fentanyl and Precedex. Pressure support SIMV is the best ventilator mode without breath  stacking. This morning while attempting transfer for CT angiogram chest had worsening breath stacking and in transport had to be withheld. Husband walked in this morning while patient was being wheeled out of the ICU. On the hallway he got upset and shouted out expletive; later apologized after Admin RN did service recovery. Husband indicated negative interaction with Dr MR in 2010 over rec. To get PFT reportedly. CT angio    - negative for PE   SUBJECTIVE/OVERNIGHT/INTERVAL HX  Improved breath stacking, no acute events overnight  VITAL SIGNS: Temp:  [99.8 F (37.7 C)-101.5 F (38.6 C)] 99.8 F (37.7 C) (10/25 0352) Pulse Rate:  [67-114] 94 (10/25 0700) Resp:  [12-17] 13 (10/25 0700) BP: (114-195)/(48-106) 122/54 mmHg (10/25 0700) SpO2:  [93 %-98 %] 95 % (10/25 0700) FiO2 (%):  [40 %-50 %] 40 % (10/25 0400) Weight:  [160 lb 11.5 oz (72.9 kg)] 160 lb 11.5 oz (72.9 kg) (10/25 0340) HEMODYNAMICS:   VENTILATOR SETTINGS: Vent Mode:  [-] PCV FiO2 (%):   [40 %-50 %] 40 % Set Rate:  [10 bmp-12 bmp] 12 bmp Vt Set:  [400 mL] 400 mL PEEP:  [5 cmH20] 5 cmH20 Pressure Support:  [20 cmH20] 20 cmH20 Plateau Pressure:  [9 cmH20-15 cmH20] 15 cmH20 INTAKE / OUTPUT:  Intake/Output Summary (Last 24 hours) at 08/09/15 0752 Last data filed at 08/09/15 0600  Gross per 24 hour  Intake   2771 ml  Output   2355 ml  Net    416 ml   PHYSICAL EXAMINATION: General: NAD, chronically ill appearing female Neuro: sedated. RASS -3 on fentanyl and Versed with much improved breath stacking  HEENT: Pupils reactive, ETT in place, surgical scar over anterior neck area Cardiovascular: RRR, no murmur Lungs: Decreased BS, wheezing R>L. Breath stacking is improved. Abdomen: Soft, non tender, decreased BS Musculoskeletal: No edema Skin: No rashes  LABS:  CBC  Recent Labs Lab 08/07/15 0255 08/08/15 0232 08/09/15 0400  WBC 15.0* 13.6* 15.4*  HGB 9.8* 9.6* 9.9*  HCT 31.5* 31.9* 33.0*  PLT 256 334 329   Coag's No results for input(s): APTT, INR in the last 168 hours. BMET  Recent Labs Lab 08/07/15 0255 08/08/15 0232 08/09/15 0400  NA 141 147* 148*  K 4.7 4.4 4.5  CL 107 109 109  CO2 28 29 30   BUN 33* 40* 41*  CREATININE 0.96 1.04* 1.09*  GLUCOSE 209* 192* 207*   Electrolytes  Recent Labs Lab 08/07/15 0255 08/08/15 0232 08/09/15 0400  CALCIUM 8.3* 8.6* 8.2*  MG 2.2 2.1 2.3  PHOS 3.1 3.2 3.2   Sepsis Markers  Recent Labs Lab 08/03/15 0250 08/04/15 1158 08/05/15 0307  LATICACIDVEN  --   --  1.1  PROCALCITON 11.43 4.95 3.05   ABG  Recent Labs Lab 08/06/15 1211 08/08/15 1132 08/09/15 0450  PHART 7.237* 7.371 7.389  PCO2ART 63.7* 55.9* 50.4*  PO2ART 61.0* 103.0* 77.7*   Liver Enzymes  Recent Labs Lab 08/07/15 0255  AST 35  ALT 45  ALKPHOS 51  BILITOT 0.6  ALBUMIN 2.1*   Cardiac Enzymes  Recent Labs Lab 08/02/15 2310 08/04/15 1943 08/05/15 0307  TROPONINI <0.03 <0.03 0.03   Glucose  Recent Labs Lab  08/08/15 0351 08/08/15 0744 08/08/15 1136 08/08/15 1603 08/08/15 1942 08/09/15 0354  GLUCAP 173* 197* 188* 164* 170* 185*   Imaging Dg Chest Port 1 View  08/09/2015  CLINICAL DATA:  Intubated, acute respiratory failure, sepsis, acute kidney injury. EXAM: PORTABLE CHEST 1 VIEW COMPARISON:  Portable chest x-ray of August 08, 2015 FINDINGS: The lungs are reasonably well inflated. The pulmonary interstitial markings are slightly less conspicuous today. The cardiac silhouette is normal in size. The pulmonary vascularity is not engorged. There is stable tortuosity of the ascending thoracic aorta. There is no pleural effusion or pneumothorax. The endotracheal tube tip lies approximately 3 cm above the carina. The esophagogastric tube tip projects below the inferior margin of the image. The left internal jugular venous catheter tip projects over the midportion of the SVC. Patient has undergone previous lower thoracic vertebroplasty at 2 levels. IMPRESSION: Slight interval decrease in pulmonary interstitial edema or infiltrate. Persistent confluent density above the right hemidiaphragm. The support tubes are in reasonable position. Electronically Signed   By: David  [place] M.D.   On: 08/09/2015 07:29   Dg Chest Port 1 View  08/08/2015  CLINICAL DATA:  S/p central line insertion EXAM: PORTABLE CHEST 1 VIEW COMPARISON:  08/08/2015 FINDINGS: Endotracheal tube is 2.6 cm above the carina. Orogastric tube is off the film beyond the gastroesophageal junction. Left IJ central line tip overlies the superior vena cava. There is persistent bibasilar atelectasis or infiltrates, right greater than left. Accounting for differences in technique there has probably been little change. There is no pneumothorax. Status post vertebroplasty. IMPRESSION: Interval placement of left IJ central line. Electronically Signed   By: Norva Pavlov M.D.   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  - Acute on chronic respiratory failure in setting of sepsis syndrome (culture negative), AKI, and metabolic acidosis.  P:  Continue PCV Scheduled duoneb with prn albuterol Pulmicort twice a day  Higher Solu-Medrol dose since 08/06/2015 to continue  CXR in AM Husband ok with trach, will discuss with him and will proceed with trach later on this week.  CARDIOVASCULAR CVL 10/24 >> A:  Hx of HTN.   - Duplex lower extremity positive for bilateral deep vein thrombosis 08/05/2015 and on IV heparin. CT angio chest negative for PE 08/06/2015  P:  Hold outpt verapamil Continue IV heparin with monitoring per pharmacy, will hold for trach.  RENAL A:  AKI in setting of sepsis and hypovolemia >> baseline creatinine 1.08 from 03/11/15. -Resolved 08/06/2015 Lactic acidosis. - resolved Hypokalemia - resolved Hypernatremia  P:  Foley KVO Monitor renal fx, urine outpt F/u AM BMET Free water for hypernatremia.  GASTROINTESTINAL A:  Constipation. - s/p BM 10/24 Nutrition. Biliary sludge seen on CT chest 08/05/2015  P:  Tube feeds Protonix for SUP Bowel regimen RUQ [place] due to biliary sludge on CT chest - pending  HEMATOLOGIC A:  Leukocytosis. - improving Mild normocytic anemia - likely 2/2 dilution from IVF P:  F/u CBC IV heparin per cardiac section  INFECTIOUS  Blood 10/17 >> NGTD BCx2 10/20>>NGTD Sputum 10/17 >> NGTD Urine 10/17 >> NGTD UCx 10/20 >> NGTD Legionella Ag 10/17 >> neg Pneumococcal Ag 10/17 >> neg Influenza PCR 10/17 >> neg  A:  Sepsis likely 2/2 UTI (given dirty UA), possibility of pulm source given COPD but CT chest 08/05/15 - essentially clear but ? RLL infiltrate  - febrile. PCT down to 3 (10/23)  P:  CTX 10/17 >>10/20 Azithro 10/17 >>10/20  Vanc 10/20>>10/22   10/24>>> Zosyn 10/20>>   ENDOCRINE A:  Hx of Goiter. - TSH  wnl P:  Follow CBGs  NEUROLOGIC A:  Acute encephalopathy 2nd to sepsis, respiratory failure, AKI.   P:  RASS goal: -3 other than wake up assessment Fentanyl, versed gtt  FAMILY  - Updates: husband updated at bedside daily 07/22/2015 through 08/06/2015. Not present at this time   Shirlee Latch, M.D., MPH PGY-2,  [facility] 08/09/2015 7:52 AM   Attending Note:  69 year old female with COPD history who is in respiratory failure due to COPD exacerbation and ?PNA. Febrile overnight. Will continue vanc/zosyn. Asynchrony on exam with R>L exp wheezes but improved with PCV. Will continue steroids, continue PCV to avoid breath stacking. Spoke with husband extensively regarding trach/peg.  He originally was pro the idea but after learning that she will not be able to speak or eat afterwards and after learning that she will not be able to go home with trach he is having second thoughts due to finances.  Will have him speak with case management but continue as full code for now.  Will postpone trach until he speaks with the rest of his family.  The patient is critically ill with multiple organ systems failure and requires high complexity decision making for assessment and support, frequent evaluation and titration of therapies, application of advanced monitoring technologies and extensive interpretation of multiple databases.   Critical Care Time devoted to patient care services described in this note is 35 Minutes. This time reflects time of care of this signee Dr Koren Bound. This critical care time does not reflect procedure time, or teaching time or supervisory time of PA/NP/Med student/Med Resident etc but could involve care discussion time.  Alyson Reedy, M.D. [facility] Pulmonary/Critical Care Medicine. Pager: [phone]. After hours pager: [phone].

## 2015-08-09 NOTE — Progress Notes (Signed)
Patient has blood tinged urine. E-link notified. Continue to watch urine and continue heparin gtt per MD. Pharmacy also notified.

## 2015-08-09 NOTE — Progress Notes (Signed)
ANTICOAGULATION CONSULT NOTE - Follow-up Consult  Pharmacy Consult for heparin Indication: Bilateral DVTs  Allergies  Allergen Reactions  . Tramadol     memory issues  . Boniva [Ibandronic Acid]     Cough, achy    Patient Measurements: Height: 5' (152.4 cm) Weight: 160 lb 11.5 oz (72.9 kg) IBW/kg (Calculated) : 45.5  Vital Signs: Temp: 99.8 F (37.7 C) (10/25 1151) Temp Source: Oral (10/25 1151) BP: 149/70 mmHg (10/25 1200) Pulse Rate: 71 (10/25 1200)  Labs:  Recent Labs  08/07/15 0255 08/08/15 0232 08/09/15 0400  HGB 9.8* 9.6* 9.9*  HCT 31.5* 31.9* 33.0*  PLT 256 334 329  HEPARINUNFRC 0.44 0.43 0.40  CREATININE 0.96 1.04* 1.09*   Called Lab- Tubed Labs for 10/24: Hgb 9.6, Plts 334, Heparin level 0.43, SCr 1.04  Estimated Creatinine Clearance: 44.1 mL/min (by C-G formula based on Cr of 1.09).   Assessment: 69 YOF on heparin for bilateral DVT's. Heparin level remains therapeutic on 1000 units/hr. RN reports pink tinged urine today - monitoring. Hemoglobin is stable, platelet count is improved. No overt bleeding noted. Renal function back to baseline.    Goal of Therapy:  Heparin level 0.3-0.7 units/ml Monitor platelets by anticoagulation protocol: Yes   Plan:  Continue Heparin to 1000 units/hr to keep in range Daily heparin level and CBC  Link SnufferJessica Alysse Rathe, PharmD, BCPS Clinical Pharmacist [phone]  08/09/2015, 2:07 PM

## 2015-08-09 NOTE — Care Management Important Message (Signed)
Important Message  Patient Details  Name: Laura MelnickMelody Stinson MRN: [id number] Date of Birth: [birth year]   Medicare Important Message Given:  Yes-second notification given    Elliot CousinShavis, Danissa Rundle Ellen, RN 08/09/2015, 2:21 PM

## 2015-08-09 NOTE — Progress Notes (Signed)
Patient's urine output is pink tinged. Pharmacist notified as patient is on heparin gtt. Will continue to monitor and pharmacist will determine if heparin rate will be increased.

## 2015-08-09 NOTE — Progress Notes (Signed)
eLink Physician-Brief Progress Note Patient Name: Laura MelnickMelody [name] DOB: Apr 30, 1946 MRN: [id number]   Date of Service  08/09/2015  HPI/Events of Note  Trouble with vent synchrony again.  Tried several modes of ventilation >> Pressure control seemed best tolerated.   eICU Interventions  Changed to pressure control.  Might need to consider tracheostomy sooner rather than later if family wishes to continue aggressive care.      Intervention Category Major Interventions: Other:  [name] 08/09/2015, 9:12 PM

## 2015-08-09 NOTE — Progress Notes (Signed)
PCCM Interval Progress Note  Called by Pola CornELINK MD to assess pt at bedside for sudden increase in HR.  Per RN, pt had BM and suddenly HR shot up to 180's - narrow complex; A.fib vs SVT.  Her SpO2 also decreased from 99% to 89-90%.  6mg  adenosine given and HR dropped into low 100's, rhythm appeared to be A.flutter.  No breath sounds appreciated bilaterally while on vent.  Pt disconnected from vent and bagged manually which resulted in return of breath sounds bilaterally.  During this process, HR gradually began to drop into 120's.  We then re-connected pt to vent and [facility]  She continued to have good breath sounds bilaterally and she then also spontaneously converted into NSR.  I suspect that her tachycardia was a compensatory response to a malfunction in vent connection vs mucous plug causing her to drop her O2 saturations.  Once SpO2 improved back into high 90's, her tachycardia resolved and she converted to NSR.  EKG obtained which demonstrated NSR.  A CXR is pending.   Rutherford Guysahul Kindel Rochefort, [place] - C [place] Pulmonary & Critical Care Medicine Pager: [phone]  or [phone] 08/09/2015, 8:09 PM

## 2015-08-09 NOTE — Progress Notes (Signed)
Nutrition Follow-up  DOCUMENTATION CODES:   Not applicable  INTERVENTION:    Continue Vital AF 1.2, change goal rate to 50 ml/h (1200 ml per day) to provide 1440 kcals, 90 gm protein, 973 ml free water daily.   Continue free water flushes per MD.  NUTRITION DIAGNOSIS:   Inadequate oral intake related to inability to eat as evidenced by NPO status.  Ongoing   GOAL:   Patient will meet greater than or equal to 90% of their needs  Met  MONITOR:   Vent status, Labs, Weight trends, TF tolerance, Skin, I & O's, Diet advancement  ASSESSMENT:   69 yo former smoker with altered mental status4 yo former smoker with altered mental status for 1 day from AKI, sepsis, acute on chronic hypoxic/hypercapnic respiratory failure. Has hx of COPD and followed by Dr. Melvyn Novas in pulmonary office. for 1 day from AKI, sepsis, acute on chronic hypoxic/hypercapnic respiratory failure. Has hx of COPD and followed by Dr. Melvyn Novas in pulmonary office.  Patient is currently intubated on ventilator support MV: 6.5 L/min Temp (24hrs), Avg:100.6 F (38.1 C), Min:99.8 F (37.7 C), Max:101.5 F (38.6 C)   Patient is currently receiving Vital AF 1.2 via OGT at 60 ml/h (1440 ml/day) to provide 1728 kcals, 108 gm protein, 1168 ml free water daily. Also receiving 250 ml free water flushes every 6 hours for total free water intake of 2168 ml per day.  Diet Order:  Diet NPO time specified  Skin:  Wound (see comment) (Pressure ulcer, stage 1, sacrum)  Last BM:  10/24  Height:   Ht Readings from Last 1 Encounters:  08/04/15 5' (1.524 m)    Weight:   Wt Readings from Last 1 Encounters:  08/09/15 160 lb 11.5 oz (72.9 kg)   08/02/15 134 lb 11.2 oz (61.1 kg)       07/31/2015 132 lb 4.4 oz (60 kg)  Ideal Body Weight:  54.5 kg  BMI:  25.8 (using admit weight)  Estimated Nutritional Needs:   Kcal:  1460  Protein:  90-110 gm  Fluid:  1.5-1.8 L  EDUCATION NEEDS:   No education needs identified  at this time  Molli Barrows, [place], [place], [place] Pager [phone] After Hours Pager [phone]

## 2015-08-09 NOTE — Progress Notes (Signed)
PCCM Interval Progress Note  Events:  Hypertension.  Interventions:  Hydralazine PRN.   Rutherford Guysahul Trinita Devlin, [place] - C [place] Pulmonary & Critical Care Medicine Pager: [phone]  or [phone] 08/09/2015, 8:10 PM

## 2015-08-09 NOTE — Progress Notes (Signed)
eLink Physician-Brief Progress Note Patient Name: Laura MelnickMelody [name] DOB: March 17, 1946 MRN: [id number]   Date of Service  08/09/2015  HPI/Events of Note  Called to room to assess tachycardia.  Pt in narrow complex rhythm with HR 180's to 210's.  She had poor air movement on vent, and SpO2 down into 80's.   eICU Interventions  Given 6 mg adenosine >> a flutter identified.  She was taken off vent and RT bag-vent through ETT.  SpO2 increased to 99%.  No difficulty reported by RT for bag-vent.  HR returned to NSR.  Pt placed back on vent, and changed to PRVC mode.  CXR pending.      Intervention Category Major Interventions: Other:  [name] 08/09/2015, 7:55 PM

## 2015-08-09 NOTE — Care Management Note (Addendum)
Case Management Note  Patient Details  Name: Brandon MelnickMelody Gautney MRN: [id number] Date of Birth: 07-21-1946  Subjective/Objective:    AKI, sepsis, acute on chronic hypoxic/hypercapnic respiratory failure                Action/Plan: NCM spoke to husband, Karoline CaldwellSam Sobel # [phone]. Husband states pt was independent at home prior to admission. States pt has oxygen at home. He also has blood pressure machine, and pulse ox. He is concerned about trach placement and wanted to discuss with pt's siblings and children about moving forward with trach placement. States he has some concerns about pt living in a SNF post dc if she recovers from illness. Therapeutic communication used to answer pt concerns and questions. He was very teary eyed and afraid to make the wrong decision. States he will discuss with his family and let attending know his decision about placement. Explained to pt's husband that NCM is available to assist with continued CM needs.  CSW consult for SNF placement and insurance coverage for SNF.    Expected Discharge Date:                 Expected Discharge Plan:  Skilled Nursing Facility  In-House Referral:  Clinical Social Worker  Discharge planning Services  CM Consult   Status of Service:  In process, will continue to follow  Medicare Important Message Given:    Date Medicare IM Given:    Medicare IM give by:    Date Additional Medicare IM Given:    Additional Medicare Important Message give by:     If discussed at Long Length of Stay Meetings, dates discussed:    Additional Comments:  Elliot CousinShavis, Scarlet Abad Ellen, RN 08/09/2015, 2:14 PM

## 2015-08-10 ENCOUNTER — Inpatient Hospital Stay (HOSPITAL_COMMUNITY): Payer: Medicare Other

## 2015-08-10 DIAGNOSIS — J9601 Acute respiratory failure with hypoxia: Secondary | ICD-10-CM

## 2015-08-10 DIAGNOSIS — J9602 Acute respiratory failure with hypercapnia: Secondary | ICD-10-CM

## 2015-08-10 LAB — PHOSPHORUS: PHOSPHORUS: 4.7 mg/dL — AB (ref 2.5–4.6)

## 2015-08-10 LAB — BLOOD GAS, ARTERIAL
Acid-Base Excess: 4.1 mmol/L — ABNORMAL HIGH (ref 0.0–2.0)
BICARBONATE: 29.6 meq/L — AB (ref 20.0–24.0)
Drawn by: 43707
FIO2: 0.45
LHR: 20 {breaths}/min
O2 SAT: 96.6 %
PATIENT TEMPERATURE: 100.4
PCO2 ART: 60.2 mmHg — AB (ref 35.0–45.0)
PEEP/CPAP: 8 cmH2O
PH ART: 7.319 — AB (ref 7.350–7.450)
PO2 ART: 97.2 mmHg (ref 80.0–100.0)
TCO2: 31.3 mmol/L (ref 0–100)

## 2015-08-10 LAB — CBC
HEMATOCRIT: 34.1 % — AB (ref 36.0–46.0)
HEMOGLOBIN: 10.1 g/dL — AB (ref 12.0–15.0)
MCH: 29.8 pg (ref 26.0–34.0)
MCHC: 29.6 g/dL — AB (ref 30.0–36.0)
MCV: 100.6 fL — ABNORMAL HIGH (ref 78.0–100.0)
Platelets: 327 10*3/uL (ref 150–400)
RBC: 3.39 MIL/uL — AB (ref 3.87–5.11)
RDW: 14.5 % (ref 11.5–15.5)
WBC: 17.5 10*3/uL — ABNORMAL HIGH (ref 4.0–10.5)

## 2015-08-10 LAB — BASIC METABOLIC PANEL
Anion gap: 10 (ref 5–15)
BUN: 47 mg/dL — ABNORMAL HIGH (ref 6–20)
CHLORIDE: 106 mmol/L (ref 101–111)
CO2: 30 mmol/L (ref 22–32)
Calcium: 8.2 mg/dL — ABNORMAL LOW (ref 8.9–10.3)
Creatinine, Ser: 1.01 mg/dL — ABNORMAL HIGH (ref 0.44–1.00)
GFR calc non Af Amer: 56 mL/min — ABNORMAL LOW (ref >60)
Glucose, Bld: 242 mg/dL — ABNORMAL HIGH (ref 65–99)
POTASSIUM: 5 mmol/L (ref 3.5–5.1)
Sodium: 146 mmol/L — ABNORMAL HIGH (ref 135–145)

## 2015-08-10 LAB — GLUCOSE, CAPILLARY
GLUCOSE-CAPILLARY: 158 mg/dL — AB (ref 65–99)
GLUCOSE-CAPILLARY: 246 mg/dL — AB (ref 65–99)
Glucose-Capillary: 225 mg/dL — ABNORMAL HIGH (ref 65–99)
Glucose-Capillary: 161 mg/dL — ABNORMAL HIGH (ref 65–99)
Glucose-Capillary: 196 mg/dL — ABNORMAL HIGH (ref 65–99)

## 2015-08-10 LAB — HEPARIN LEVEL (UNFRACTIONATED): HEPARIN UNFRACTIONATED: 0.74 [IU]/mL — AB (ref 0.30–0.70)

## 2015-08-10 LAB — MAGNESIUM: Magnesium: 2.2 mg/dL (ref 1.7–2.4)

## 2015-08-10 NOTE — Progress Notes (Signed)
Inpatient Diabetes Program Recommendations  AACE/ADA: New Consensus Statement on Inpatient Glycemic Control (2015)  Target Ranges:  Prepandial:   less than 140 mg/dL      Peak postprandial:   less than 180 mg/dL (1-2 hours)      Critically ill patients:  140 - 180 mg/dL    Results for Brandon MelnickLEMONDS, [name] (MRN [id number]) as of 08/10/2015 07:49  Ref. Range 08/09/2015 03:54 08/09/2015 11:53 08/09/2015 19:33 08/09/2015 23:47 08/10/2015 03:53  Glucose-Capillary Latest Ref Range: 65-99 mg/dL 409185 (H) 811224 (H) 914192 (H) 246 (H) 225 (H)     -No History of DM, however, patient is currently Intubated and receiving IV Solumedrol 80 mg Q8 hours.  -Glucose levels elevated >200 mg/dl.  -Current Insulin Orders: Novolog Moderate SSI (0-15 units) Q4 hours.     MD- Please consider starting ICU Glycemic Control Protocol for this patient.  Could try Phase 1 first, or may want to go ahead and start Phase 3 (IV Insulin) if patient continues to have sustained glucose levels >200 mg/dl.     --Will follow patient during hospitalization--  Ambrose FinlandJeannine Johnston Marybelle Giraldo RN, MSN, CDE Diabetes Coordinator Inpatient Glycemic Control Team Team Pager: [phone] (8a-5p)

## 2015-08-10 NOTE — Progress Notes (Signed)
ANTICOAGULATION CONSULT NOTE - Follow Up Consult  Pharmacy Consult for heparin Indication: DVT   Labs:  Recent Labs  08/08/15 0232 08/09/15 0400 08/10/15 0229  HGB 9.6* 9.9* 10.1*  HCT 31.9* 33.0* 34.1*  PLT 334 329 327  HEPARINUNFRC 0.43 0.40 0.74*  CREATININE 1.04* 1.09*  --      Assessment: 69yo female now slightly supratherapeutic on heparin after a few levels at goal.  Goal of Therapy:  Heparin level 0.3-0.7 units/ml   Plan:  Will decrease heparin gtt slightly back to 950 units/hr and check level in 8hr.  Vernard GamblesVeronda Jamarco Zaldivar, PharmD, BCPS  08/10/2015,4:08 AM

## 2015-08-10 NOTE — Care Management Note (Signed)
Case Management Note  Patient Details  Name: Laura MelnickMelody [name] MRN: [id number] Date of Birth: 04-Sep-1946  Subjective/Objective:                Remains on vent - husband has decided to make DNR and comfort tomorrow when son gets here.    Action/Plan:   Expected Discharge Date:  08/05/15               Expected Discharge Plan:  Skilled Nursing Facility  In-House Referral:  Clinical Social Work  Discharge planning Services  CM Consult  Post Acute Care Choice:    Choice offered to:     DME Arranged:    DME Agency:     HH Arranged:    HH Agency:     Status of Service:  In process, will continue to follow  Medicare Important Message Given:  Yes-second notification given Date Medicare IM Given:    Medicare IM give by:    Date Additional Medicare IM Given:    Additional Medicare Important Message give by:     If discussed at Long Length of Stay Meetings, dates discussed:    Additional Comments:  Vangie BickerBrown, Milan Clare Jane, RN 08/10/2015, 2:14 PM

## 2015-08-10 NOTE — Progress Notes (Signed)
   08/10/15 1300  Clinical Encounter Type  Visited With Patient and family together  Visit Type Spiritual support;Patient actively dying  Referral From Nurse  Spiritual Encounters  Spiritual Needs Emotional;Prayer;Grief support  Stress Factors  Family Stress Factors Health changes;Loss  Visited two times at length with husband of patient today to answer concerns about withdrawal of care and to pray with him. Husband has asked that I be present at withdrawal on Thursday but have told him I will have to be gone by 12. Made staff chaplain aware of issue.

## 2015-08-10 NOTE — Progress Notes (Signed)
PULMONARY / CRITICAL CARE MEDICINE   Name: [name] MRN: [id number] DOB: Sep 04, 1946    ADMISSION DATE:  Aug 13, 2015 CONSULTATION DATE:  08-13-15  REFERRING MD :  EDP  CHIEF COMPLAINT:  Altered Mental Status  INITIAL PRESENTATION: 69 yo former smoker with altered mental status for 1 day from AKI, sepsis, acute on chronic hypoxic/hypercapnic respiratory failure. Has hx of COPD and followed by Dr. Sherene Sires in pulmonary office.  STUDIES:  PFT 06/07/13 >> FEV1 1.38 (65%), FEV1% 50, TLC 5.45 (118%), DLCO 49%, no BD  SIGNIFICANT EVENTS: 10/17 admit  08/05/15: Afebrile. Breath stacking on vent. LE dopplers ordered  08/06/15: Duplex lower extremity yesterday positive for bilateral DVT. Started on IV heparin. Echocardiogram shows elevated pulmonary artery systolic pressure but no RV dilation. Left ventricular ejection fraction is fine. She is now on fentanyl and Precedex. Pressure support SIMV is the best ventilator mode without breath  stacking. This morning while attempting transfer for CT angiogram chest had worsening breath stacking and in transport had to be withheld. Husband walked in this morning while patient was being wheeled out of the ICU. On the hallway he got upset and shouted out expletive; later apologized after Admin RN did service recovery. Husband indicated negative interaction with Dr MR in 2010 over rec. To get PFT reportedly. CT angio    - negative for PE   SUBJECTIVE/OVERNIGHT/INTERVAL HX  Improved breath stacking, no acute events overnight  VITAL SIGNS: Temp:  [97.4 F (36.3 C)-100.4 F (38 C)] 99.1 F (37.3 C) (10/26 0845) Pulse Rate:  [66-135] 72 (10/26 1100) Resp:  [11-24] 12 (10/26 1100) BP: (91-196)/(53-98) 92/54 mmHg (10/26 1100) SpO2:  [94 %-99 %] 99 % (10/26 1100) FiO2 (%):  [40 %-45 %] 40 % (10/26 0816) Weight:  [73.3 kg (161 lb 9.6 oz)] 73.3 kg (161 lb 9.6 oz) (10/26 0500) HEMODYNAMICS:   VENTILATOR SETTINGS: Vent Mode:  [-] PCV FiO2 (%):  [40 %-45  %] 40 % Set Rate:  [10 bmp-12 bmp] 10 bmp PEEP:  [5 cmH20-8 cmH20] 8 cmH20 Plateau Pressure:  [18 cmH20-28 cmH20] 28 cmH20 INTAKE / OUTPUT:  Intake/Output Summary (Last 24 hours) at 08/10/15 1208 Last data filed at 08/10/15 1100  Gross per 24 hour  Intake 2451.08 ml  Output   2800 ml  Net -348.92 ml   PHYSICAL EXAMINATION: General: NAD, chronically ill appearing female Neuro: sedated. RASS -3 on fentanyl and Versed with much improved breath stacking  HEENT: Pupils reactive, ETT in place, surgical scar over anterior neck area Cardiovascular: RRR, no murmur Lungs: Decreased BS, wheezing R>L. Breath stacking is improved. Abdomen: Soft, non tender, decreased BS Musculoskeletal: No edema Skin: No rashes  LABS:  CBC  Recent Labs Lab 08/08/15 0232 08/09/15 0400 08/10/15 0229  WBC 13.6* 15.4* 17.5*  HGB 9.6* 9.9* 10.1*  HCT 31.9* 33.0* 34.1*  PLT 334 329 327   Coag's No results for input(s): APTT, INR in the last 168 hours. BMET  Recent Labs Lab 08/08/15 0232 08/09/15 0400 08/10/15 0229  NA 147* 148* 146*  K 4.4 4.5 5.0  CL 109 109 106  CO2 BUN 40* 41* 47*  CREATININE 1.04* 1.09* 1.01*  GLUCOSE 192* 207* 242*   Electrolytes  Recent Labs Lab 08/08/15 0232 08/09/15 0400 08/10/15 0229  CALCIUM 8.6* 8.2* 8.2*  MG 2.1 2.3 2.2  PHOS 3.2 3.2 4.7*   Sepsis Markers  Recent Labs Lab 08/04/15 1158 08/05/15 0307  LATICACIDVEN  --  1.1  PROCALCITON 4.95 3.05  ABG  Recent Labs Lab 08/08/15 1132 08/09/15 0450 08/10/15 0415  PHART 7.371 7.389 7.319*  PCO2ART 55.9* 50.4* 60.2*  PO2ART 103.0* 77.7* 97.2   Liver Enzymes  Recent Labs Lab 08/07/15 0255  AST 35  ALT 45  ALKPHOS 51  BILITOT 0.6  ALBUMIN 2.1*   Cardiac Enzymes  Recent Labs Lab 08/04/15 1943 08/05/15 0307  TROPONINI <0.03 0.03   Glucose  Recent Labs Lab 08/09/15 0354 08/09/15 1153 08/09/15 1933 08/09/15 2347 08/10/15 0353 08/10/15 0822  GLUCAP 185*  224* 192* 246* 225* 161*   Imaging Dg Chest Port 1 View  08/10/2015  CLINICAL DATA:  Acute respiratory failure, intubated patient. EXAM: PORTABLE CHEST 1 VIEW COMPARISON:  Portable chest x-ray of August 09, 2015 FINDINGS: The lungs are well-expanded. There are persistent increased interstitial densities at both lung bases. The hemidiaphragms remain visible. The heart is normal in size. The pulmonary vascularity is not engorged. The endotracheal tube tip lies 2.3 cm above the carina and is stable. The esophagogastric tube tip projects below the inferior margin of the image. The left internal jugular venous catheter tip projects over the mid SVC. The bony thorax exhibits no acute abnormality. IMPRESSION: Stable appearance of the bibasilar interstitial densities consistent with atelectasis, scarring, or less likely pneumonia. The support tubes are in reasonable position. Electronically Signed   By: David  SwazilandJordan M.D.   On: 08/10/2015 07:19   Dg Chest Port 1 View  08/09/2015  CLINICAL DATA:  Tachycardia. Low oxygen saturation over the last 30 minutes. EXAM: PORTABLE CHEST 1 VIEW COMPARISON:  08/09/2015 at 5:15 a.m. FINDINGS: There is linear and reticular lung base opacity likely combination scarring and subsegmental atelectasis. No evidence of pneumonia or pulmonary edema. No pleural effusion or pneumothorax. Cardiac silhouette is normal in size and configuration. No mediastinal or hilar masses or evidence of adenopathy. Endotracheal tube tip currently projects 1.6 cm above the carina. Orogastric tube passes below the diaphragm into stomach. Left internal jugular central venous line is stable with its tip in the mid superior vena cava near the confluence with the brachiocephalic vein. Skeletal structures are grossly intact. IMPRESSION: 1. No significant change from the prior study allowing for differences in patient positioning and lung volumes. Lung base opacities most likely combination of subsegmental  atelectasis and scarring. No convincing pneumonia and no pulmonary edema. 2. Support apparatus is stable and well positioned. Electronically Signed   By: Amie Portlandavid  Ormond M.D.   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  - Acute on chronic respiratory failure in setting of sepsis syndrome (culture negative), AKI, and metabolic acidosis.  P:  Continue PCV No weaning will withdraw in AM. Scheduled duoneb with prn albuterol Pulmicort twice a day  Higher Solu-Medrol dose since 08/06/2015 to continue for now. CXR in AM Spoke with husband will proceed with withdrawal in AM, maintain comfortable and no weaning.  CARDIOVASCULAR CVL 10/24 >> A:  Hx of HTN.   - Duplex lower extremity positive for bilateral deep vein thrombosis 08/05/2015 and on IV heparin. CT angio chest negative for PE 08/06/2015  P:  Hold outpt verapamil Continue IV heparin with monitoring per pharmacy, will hold for trach.  RENAL A:  AKI in setting of sepsis and hypovolemia >> baseline creatinine 1.08 from 03/11/15. -Resolved 08/06/2015 Lactic acidosis. - resolved Hypokalemia - resolved Hypernatremia  P:  Foley KVO Monitor renal fx, urine outpt F/u AM BMET Free water for hypernatremia.  GASTROINTESTINAL A:  Constipation. - s/p BM 10/24 Nutrition. Biliary sludge seen on CT chest 08/05/2015  P:  Tube feeds Protonix for SUP Bowel regimen RUQ [place] due to biliary sludge on CT chest WNL.  HEMATOLOGIC A:  Leukocytosis. - improving Mild normocytic anemia - likely 2/2 dilution from IVF P:  F/u CBC IV heparin per cardiac section  INFECTIOUS  Blood 10/17 >> NGTD BCx2 10/20>>NGTD Sputum 10/17 >> NGTD Urine 10/17 >> NGTD UCx 10/20 >> NGTD Legionella Ag 10/17 >> neg Pneumococcal Ag 10/17 >> neg Influenza PCR 10/17 >> neg  A:  Sepsis likely 2/2 UTI (given  dirty UA), possibility of pulm source given COPD but CT chest 08/05/15 - essentially clear but ? RLL infiltrate  - febrile. PCT down to 3 (10/23)  P:  CTX 10/17 >>10/20 Azithro 10/17 >>10/20  Vanc 10/20>>10/22   10/24>>> Zosyn 10/20>>   ENDOCRINE A:  Hx of Goiter. - TSH wnl P:  Follow CBGs  NEUROLOGIC A:  Acute encephalopathy 2nd to sepsis, respiratory failure, AKI.   P:  RASS goal: -3 other than wake up assessment Fentanyl, versed gtt  FAMILY  - Updates: husband updated at bedside will proceed with full comfort care in AM, DNR for now.  The patient is critically ill with multiple organ systems failure and requires high complexity decision making for assessment and support, frequent evaluation and titration of therapies, application of advanced monitoring technologies and extensive interpretation of multiple databases.   Critical Care Time devoted to patient care services described in this note is 35 Minutes. This time reflects time of care of this signee Dr Koren Bound. This critical care time does not reflect procedure time, or teaching time or supervisory time of PA/NP/Med student/Med Resident etc but could involve care discussion time.  Alyson Reedy, M.D. [facility] Pulmonary/Critical Care Medicine. Pager: [phone]. After hours pager: [phone].

## 2015-08-11 LAB — PHOSPHORUS: Phosphorus: 5.3 mg/dL — ABNORMAL HIGH (ref 2.5–4.6)

## 2015-08-11 LAB — GLUCOSE, CAPILLARY: Glucose-Capillary: 191 mg/dL — ABNORMAL HIGH (ref 65–99)

## 2015-08-11 LAB — MAGNESIUM: Magnesium: 2.2 mg/dL (ref 1.7–2.4)

## 2015-08-11 MED ORDER — MIDAZOLAM HCL 5 MG/ML IJ SOLN
10.0000 mg/h | INTRAMUSCULAR | Status: DC
  Filled 2015-08-11: qty 10

## 2015-08-11 MED ORDER — FENTANYL BOLUS VIA INFUSION
50.0000 ug | INTRAVENOUS | Status: DC | PRN
  Filled 2015-08-11: qty 200

## 2015-08-11 MED ORDER — SODIUM CHLORIDE 0.9 % IV SOLN
100.0000 ug/h | INTRAVENOUS | Status: DC
  Filled 2015-08-11: qty 50

## 2015-08-11 MED ORDER — MIDAZOLAM BOLUS VIA INFUSION
5.0000 mg | INTRAVENOUS | Status: DC | PRN
  Filled 2015-08-11: qty 20

## 2015-08-12 ENCOUNTER — Telehealth: Payer: Self-pay

## 2015-08-12 ENCOUNTER — Ambulatory Visit: Payer: Medicare Other | Admitting: Internal Medicine

## 2015-08-12 NOTE — Telephone Encounter (Signed)
On 08/12/2015 I received a death certificate from [employer]. The death certificate is for cremation. The patient is a patient of Doctor Molli KnockYacoub. The death certificate will be signed by Doctor Craige CottaSood Monday am. On 08/17/2015 I received the original death certificate back from Doctor Molli KnockYacoub. I got the death certificate ready for pickup and called the funeral home to let them know I was faxing the death certificate per their request. I also told them the original is ready for pickup.

## 2015-08-15 ENCOUNTER — Telehealth: Payer: Self-pay

## 2015-08-15 NOTE — Telephone Encounter (Signed)
On 08/15/2015 I received a death certificate from [employer]. The death certificate is for cremation. The patient is a patient of Doctor Molli KnockYacoub. The death certificate will be taken to [facility] tomorrow pm for signature since Doctor Molli KnockYacoub is off today. On 08/17/2015 I received the death certificate back from Doctor Molli KnockYacoub. I got the death certificate ready for pickup and called the funeral home to let them know I was faxing a copy per their request and I also told them the original is ready for pickup.

## 2015-08-16 NOTE — Progress Notes (Signed)
Withdrawal of life-sustaining measures implemented per family and physician request. Pt taken off ventilator and comfort measures pursued. A short while after removing pt from ventilator- Asystole noted on monitor. Stark JockIrekia RN and Marcelino DusterMichelle RN to bedside to auscultate for heart/lung sounds. No heart or lung sounds auscultated and pupils dilated, fixed and non-reactive. Pts cardiac time of death 12:43pm. Pts son and husband at bedside during transition. Attending MD made aware of time of death and in to speak with family.

## 2015-08-16 NOTE — Procedures (Signed)
Extubation Procedure Note  Patient Details:   Name: [name] DOB: 23-Sep-1946 MRN: [id number]   Airway Documentation:  Airway 7.5 mm (Active)  Secured at (cm) 22 cm 2015-09-27 12:00 AM    Evaluation  O2 sats: stable throughout- pt for withdrawal of life support Complications: No apparent complications Patient did tolerate procedure well. Bilateral Breath Sounds: Clear, Diminished Suctioning: Oral, Airway No- pt withdrawal of life support.  Jennette KettleBrowning, Margueritte Guthridge Joy 08/02/2015, 12:10 PM

## 2015-08-16 NOTE — Progress Notes (Signed)
PULMONARY / CRITICAL CARE MEDICINE   Name: Laura MelnickMelody Zenner MRN: [id number] DOB: 1946-05-23    ADMISSION DATE:  24-Dec-2014 CONSULTATION DATE:  2015-05-09  REFERRING MD :  EDP  CHIEF COMPLAINT:  Altered Mental Status  INITIAL PRESENTATION: 69 yo former smoker with altered mental status for 1 day from AKI, sepsis, acute on chronic hypoxic/hypercapnic respiratory failure. Has hx of COPD and followed by Dr. Sherene SiresWert in pulmonary office.  STUDIES:  PFT 06/07/13 >> FEV1 1.38 (65%), FEV1% 50, TLC 5.45 (118%), DLCO 49%, no BD  SIGNIFICANT EVENTS: 10/17 admit  08/05/15: Afebrile. Breath stacking on vent. LE dopplers ordered  08/06/15: Duplex lower extremity yesterday positive for bilateral DVT. Started on IV heparin. Echocardiogram shows elevated pulmonary artery systolic pressure but no RV dilation. Left ventricular ejection fraction is fine. She is now on fentanyl and Precedex. Pressure support SIMV is the best ventilator mode without breath  stacking. This morning while attempting transfer for CT angiogram chest had worsening breath stacking and in transport had to be withheld. Husband walked in this morning while patient was being wheeled out of the ICU. On the hallway he got upset and shouted out expletive; later apologized after Admin RN did service recovery. Husband indicated negative interaction with Dr MR in 2010 over rec. To get PFT reportedly. CT angio    - negative for PE   SUBJECTIVE/OVERNIGHT/INTERVAL HX  Improved breath stacking, no acute events overnight  VITAL SIGNS: Temp:  [97.5 F (36.4 C)-98 F (36.7 C)] 97.7 F (36.5 C) (10/27 0814) Pulse Rate:  [67-81] 67 (10/27 0800) Resp:  [10-16] 12 (10/27 0800) BP: (82-116)/(53-65) 111/57 mmHg (10/27 0800) SpO2:  [96 %-99 %] 98 % (10/27 0827) FiO2 (%):  [40 %] 40 % (10/27 0827) Weight:  [73 kg (160 lb 15 oz)] 73 kg (160 lb 15 oz) (10/27 0500) HEMODYNAMICS:   VENTILATOR SETTINGS: Vent Mode:  [-] PCV FiO2 (%):  [40 %] 40 % Set  Rate:  [10 bmp] 10 bmp PEEP:  [8 cmH20] 8 cmH20 Plateau Pressure:  [19 cmH20-21 cmH20] 20 cmH20 INTAKE / OUTPUT:  Intake/Output Summary (Last 24 hours) at 08/02/2015 1110 Last data filed at 07/17/2015 0800  Gross per 24 hour  Intake 2952.5 ml  Output   1895 ml  Net 1057.5 ml   PHYSICAL EXAMINATION: General: NAD, chronically ill appearing female Neuro: sedated. RASS -3 on fentanyl and Versed with much improved breath stacking  HEENT: Pupils reactive, ETT in place, surgical scar over anterior neck area Cardiovascular: RRR, no murmur Lungs: Decreased BS, wheezing R>L. Breath stacking is improved. Abdomen: Soft, non tender, decreased BS Musculoskeletal: No edema Skin: No rashes  LABS:  CBC  Recent Labs Lab 08/08/15 0232 08/09/15 0400 08/10/15 0229  WBC 13.6* 15.4* 17.5*  HGB 9.6* 9.9* 10.1*  HCT 31.9* 33.0* 34.1*  PLT 334 329 327   Coag's No results for input(s): APTT, INR in the last 168 hours. BMET  Recent Labs Lab 08/08/15 0232 08/09/15 0400 08/10/15 0229  NA 147* 148* 146*  K 4.4 4.5 5.0  CL 109 109 106  CO2 29 30 30   BUN 40* 41* 47*  CREATININE 1.04* 1.09* 1.01*  GLUCOSE 192* 207* 242*   Electrolytes  Recent Labs Lab 08/08/15 0232 08/09/15 0400 08/10/15 0229 07/26/2015 0600  CALCIUM 8.6* 8.2* 8.2*  --   MG 2.1 2.3 2.2 2.2  PHOS 3.2 3.2 4.7* 5.3*   Sepsis Markers  Recent Labs Lab 08/04/15 1158 08/05/15 0307  LATICACIDVEN  --  1.1  PROCALCITON 4.95 3.05   ABG  Recent Labs Lab 08/08/15 1132 08/09/15 0450 08/10/15 0415  PHART 7.371 7.389 7.319*  PCO2ART 55.9* 50.4* 60.2*  PO2ART 103.0* 77.7* 97.2   Liver Enzymes  Recent Labs Lab 08/07/15 0255  AST 35  ALT 45  ALKPHOS 51  BILITOT 0.6  ALBUMIN 2.1*   Cardiac Enzymes  Recent Labs Lab 08/04/15 1943 08/05/15 0307  TROPONINI <0.03 0.03   Glucose  Recent Labs Lab 08/09/15 2347 08/10/15 0353 08/10/15 0822 08/10/15 1528 08/10/15 1936 2015/08/20 0745  GLUCAP 246* 225*  161* 158* 196* 191*   Imaging No results found.  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  - Acute on chronic respiratory failure in setting of sepsis syndrome (culture negative), AKI, and metabolic acidosis.  P:  Terminal extubation today.  CARDIOVASCULAR CVL 10/24 >> A:  Hx of HTN. - Duplex lower extremity positive for bilateral deep vein thrombosis 08/05/2015 and on IV heparin. CT angio chest negative for PE 08/06/2015  P:  D/C cardizem drip. D/C tele monitoring.  RENAL A:  AKI in setting of sepsis and hypovolemia >> baseline creatinine 1.08 from 03/11/15. -Resolved 08/06/2015 Lactic acidosis. - resolved Hypokalemia - resolved Hypernatremia  P:  KVO IVF. Maintain foley.  GASTROINTESTINAL A:  Constipation. - s/p BM 10/24 Nutrition. Biliary sludge seen on CT chest 08/05/2015  P:  D/C TF.  HEMATOLOGIC A:  Leukocytosis. - improving Mild normocytic anemia - likely 2/2 dilution from IVF P:  D/C further blood draws.  INFECTIOUS  Blood 10/17 >> NGTD BCx2 10/20>>NGTD Sputum 10/17 >> NGTD Urine 10/17 >> NGTD UCx 10/20 >> NGTD Legionella Ag 10/17 >> neg Pneumococcal Ag 10/17 >> neg Influenza PCR 10/17 >> neg  A:  Sepsis likely 2/2 UTI (given dirty UA), possibility of pulm source given COPD but CT chest 08/05/15 - essentially clear but ? RLL infiltrate  - febrile. PCT down to 3 (10/23)  P:  D/C abx.  ENDOCRINE A:  Hx of Goiter. - TSH wnl P:  D/C CBGs.  NEUROLOGIC A:  Acute encephalopathy 2nd to sepsis, respiratory failure, AKI.   P:  Versed/fentanyl for comfort.  FAMILY  - Updates: husband and son updated bedside, they are ready for withdrawal.  The patient is critically ill with multiple organ systems failure and requires high complexity decision making for assessment and support, frequent evaluation and  titration of therapies, application of advanced monitoring technologies and extensive interpretation of multiple databases.   Critical Care Time devoted to patient care services described in this note is 35 Minutes. This time reflects time of care of this signee Dr Koren Bound. This critical care time does not reflect procedure time, or teaching time or supervisory time of PA/NP/Med student/Med Resident etc but could involve care discussion time.  Alyson Reedy, M.D. [facility] Pulmonary/Critical Care Medicine. Pager: [phone]. After hours pager: [phone].

## 2015-08-16 DEATH — deceased

## 2015-09-15 NOTE — Discharge Summary (Signed)
NA[facility]:  [name], [name]              ACCOUNT NO.:  [id number]  MEDICAL RECORD NO.:  [id number]  LOCATION:  2M11C                        FACILITY:  [facility]  PHYSICIAN:  Felipa EvenerWesam Jake Areta Terwilliger, MD  DATE OF BIRTH:  23-Aug-1946  DATE OF ADMISSION:  08/02/2015 DATE OF DISCHARGE:  Feb 17, 2015                              DISCHARGE SUMMARY   DEATH SUMMARY  PRIMARY DIAGNOSIS/CAUSE OF DEATH:  Respiratory failure secondary to chronic obstructive pulmonary disease.  SECONDARY DIAGNOSIS:  Bilateral deep venous thromboses, acute kidney injury, lactic acidosis, hypokalemia, hyponatremia, sepsis, altered mental status, urinary tract infection, acute encephalopathy.  HOSPITAL COURSE:  The patient was a 69 year old female with extensive past medical history including emphysema and COPD, who presented to the hospital with altered mental status secondary to sepsis.  The patient was unable to protect her airways.  She was intubated on 07/18/2015. Sepsis is being treated.  Mental status, however, remained very poor. The patient showed very little promise with weaning off mechanical ventilation secondary to COPD.  On day #10 post intubation, the patient was clearly making no headway.  We had discussion with the family regarding code status and the need for tracheostomy.  At this point, the husband and son stated very clearly that the patient would not want tracheostomy and to be placed on life support in the facility.  If that is the case, she would rather be comfortable and pass on, at which point, decision was made to make the patient DNR.  The following day, the husband and son informed me that they are ready for withdrawal, at which point, morphine was started.  The patient was extubated and expired shortly thereafter with the family at bedside.     Felipa EvenerWesam Jake Gary Bultman, MD     WJY/MEDQ  D:  09/07/2015  T:  09/07/2015  Job:  [id number]

## 2015-09-19 ENCOUNTER — Ambulatory Visit: Payer: Medicare Other | Admitting: Internal Medicine

## 2015-12-23 IMAGING — CT CT HEAD W/O CM
1 series · 16 of 30 positions shown, 20 images · non-contrast
Comparison: None.

CLINICAL DATA: Weakness

EXAM:
CT HEAD WITHOUT CONTRAST
TECHNIQUE: Contiguous axial images were obtained from the base of the skull
through the vertex without intravenous contrast.

[Series 2: head 5.0 h30s · axial · 0.40mm/px · z∈[-130,+15]mm · 16 of 33 slices shown, 20 images]
[im 2/33  brain]
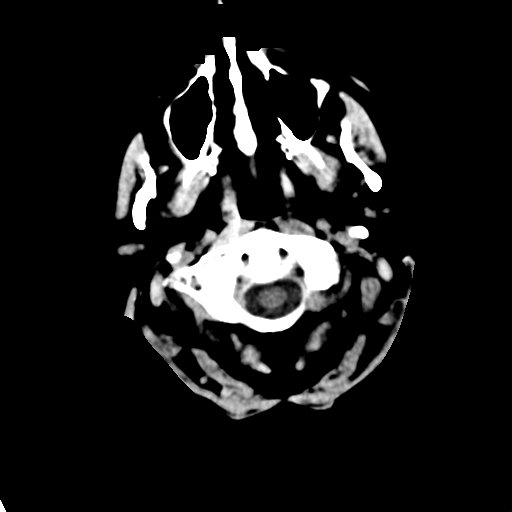
[im 2/33  bone]
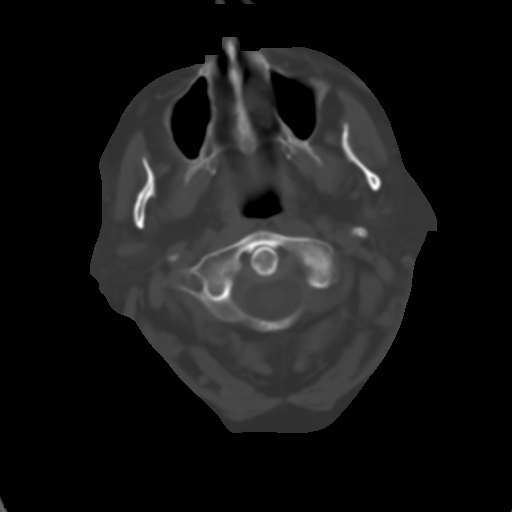
[im 4/33  brain]
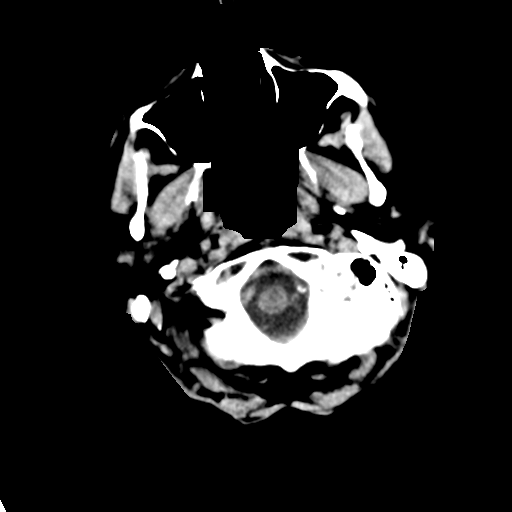
[im 6/33  brain]
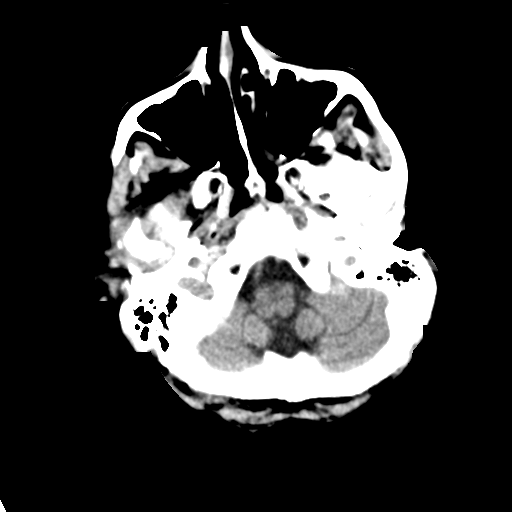
[im 8/33  brain]
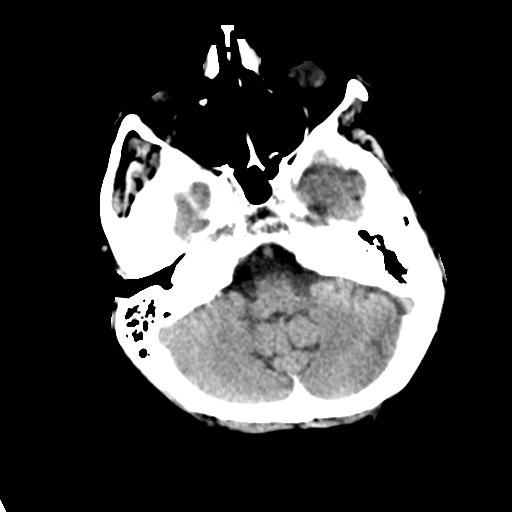
[im 9/33  brain]
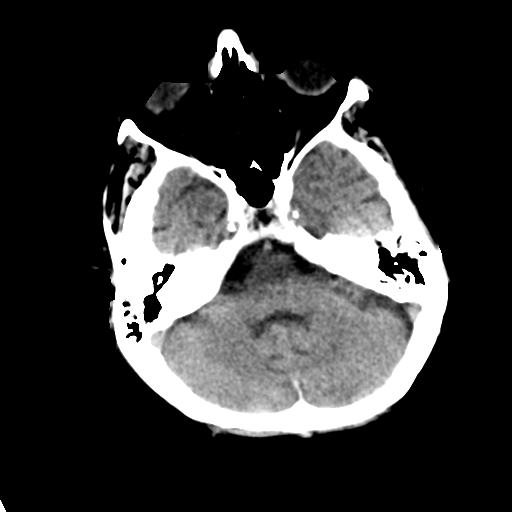
[im 9/33  bone]
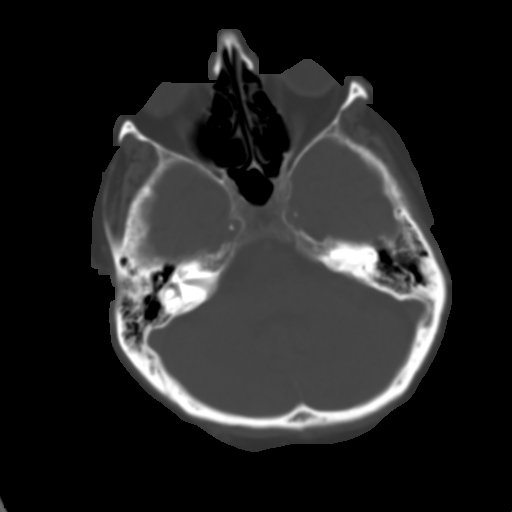
[im 12/33  brain]
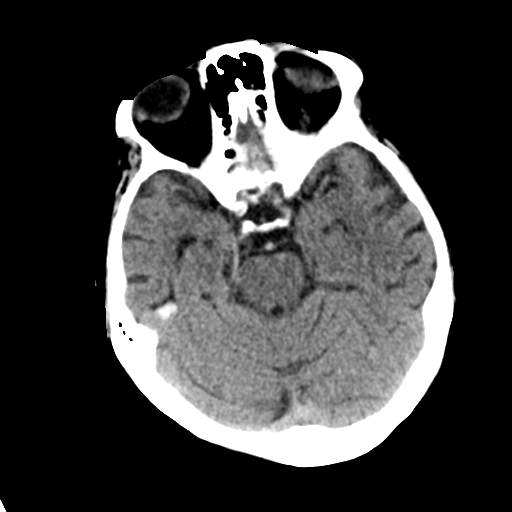
[im 14/33  brain]
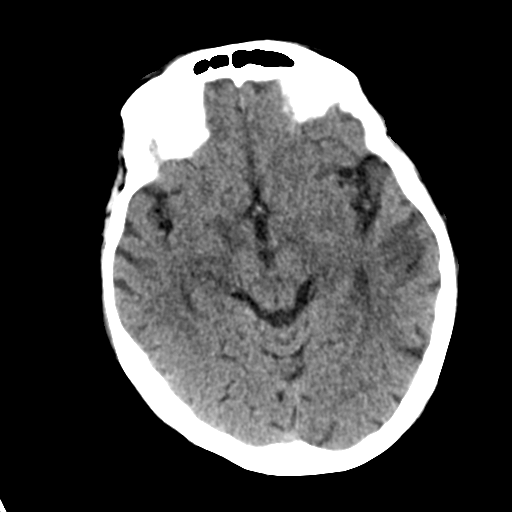
[im 16/33  brain]
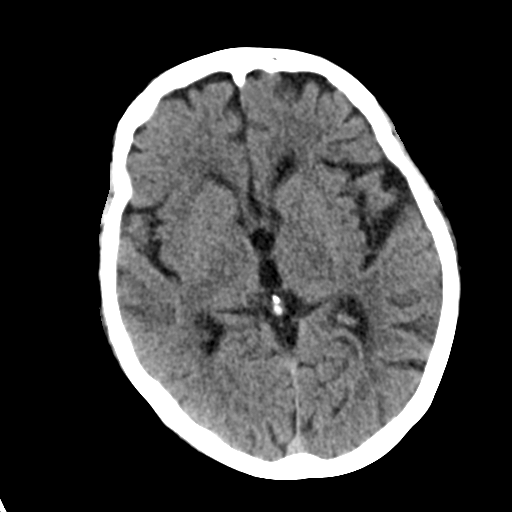
[im 17/33  brain]
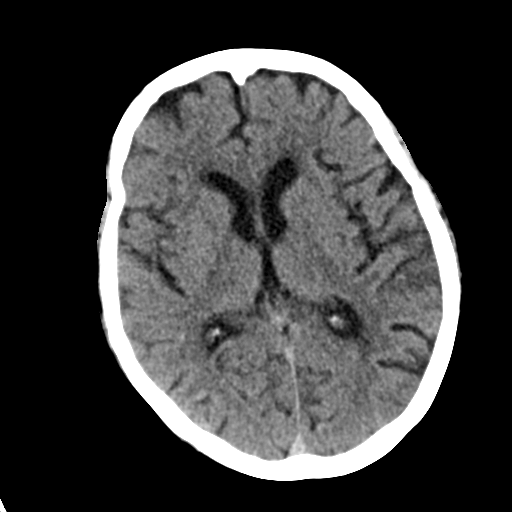
[im 17/33  bone]
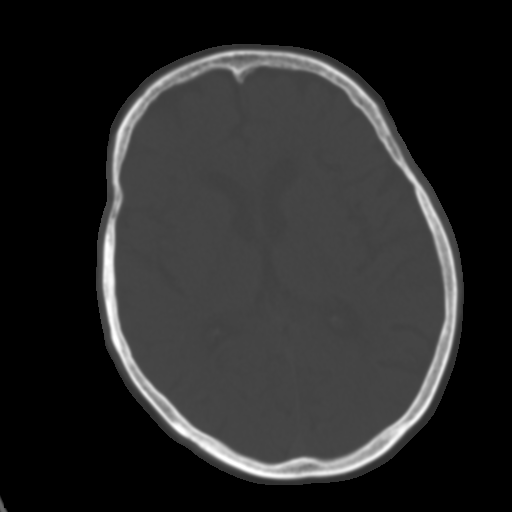
[im 19/33  brain]
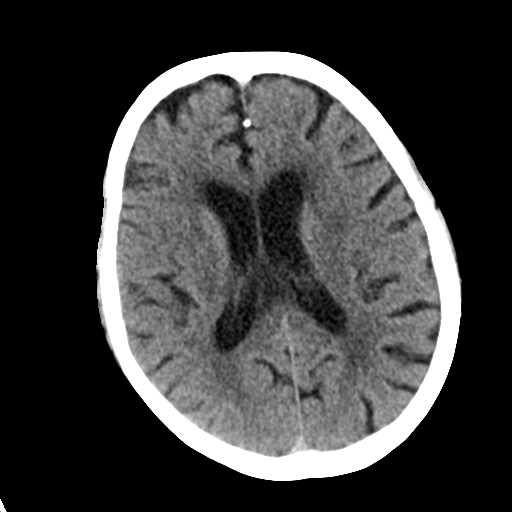
[im 21/33  brain]
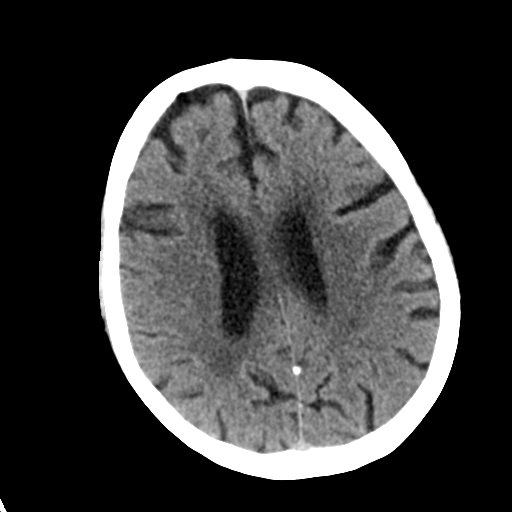
[im 24/33  brain]
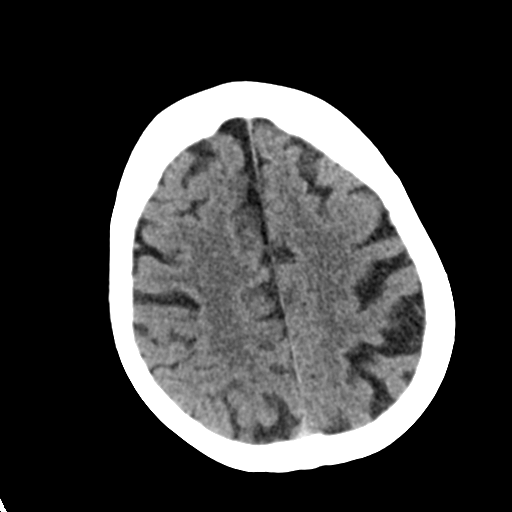
[im 25/33  brain]
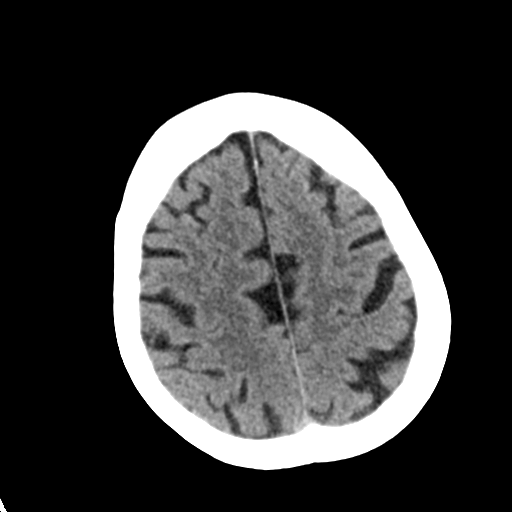
[im 25/33  bone]
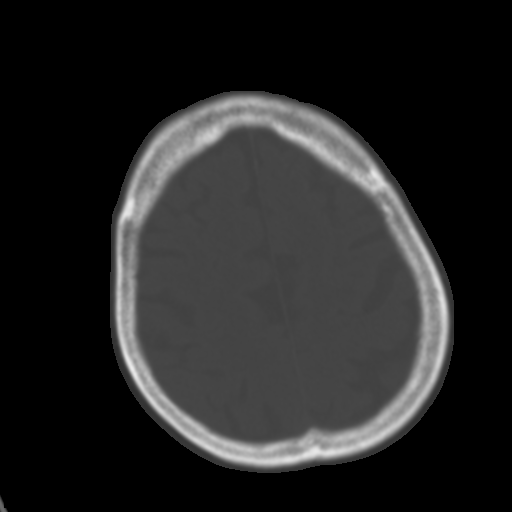
[im 27/33  brain]
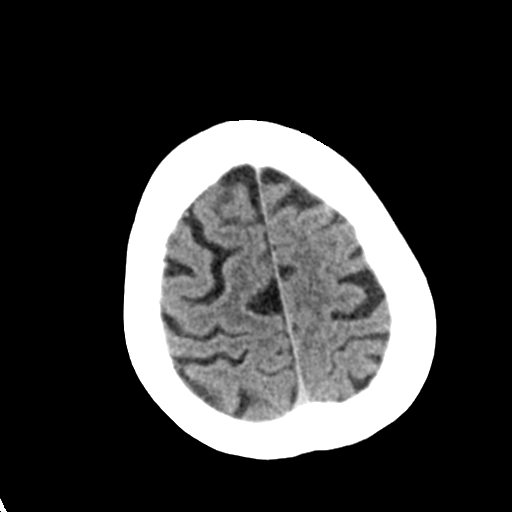
[im 29/33  brain]
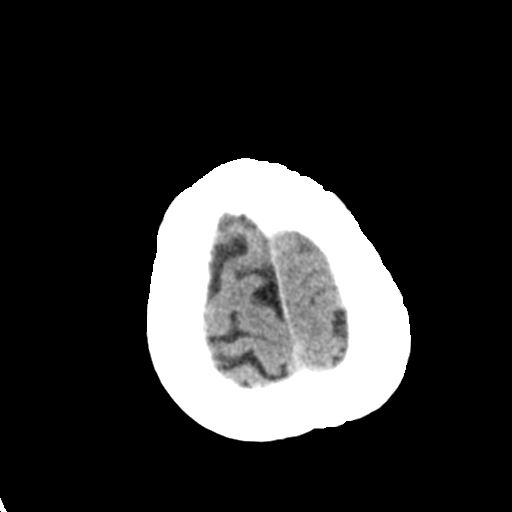
[im 31/33  brain]
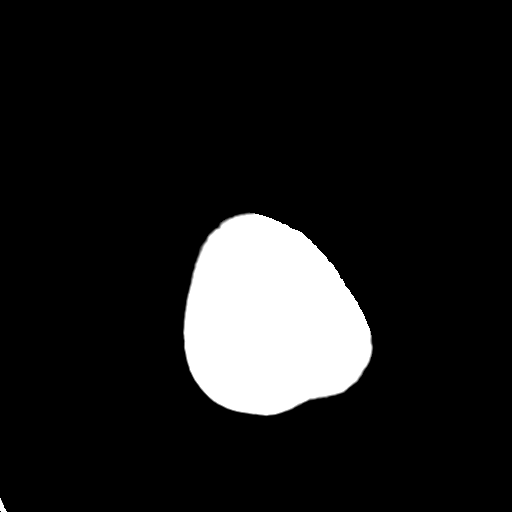

[16 of 30 positions shown; findings below may reference images not displayed]

FINDINGS: Generalized atrophy. Mild chronic microvascular ischemic change in
the white matter.

Negative for acute infarct, hemorrhage, or mass lesion.
IMPRESSION: No acute abnormality.

## 2016-05-27 IMAGING — CR DG CHEST 2V
2 series · 2 of 2 positions shown · non-contrast
Comparison: 02/17/2014.

CLINICAL DATA: Cough with worsening shortness of breath. Recheck
spots on lung.

EXAM:
CHEST  2 VIEW

[view not recorded (1 of 2)]
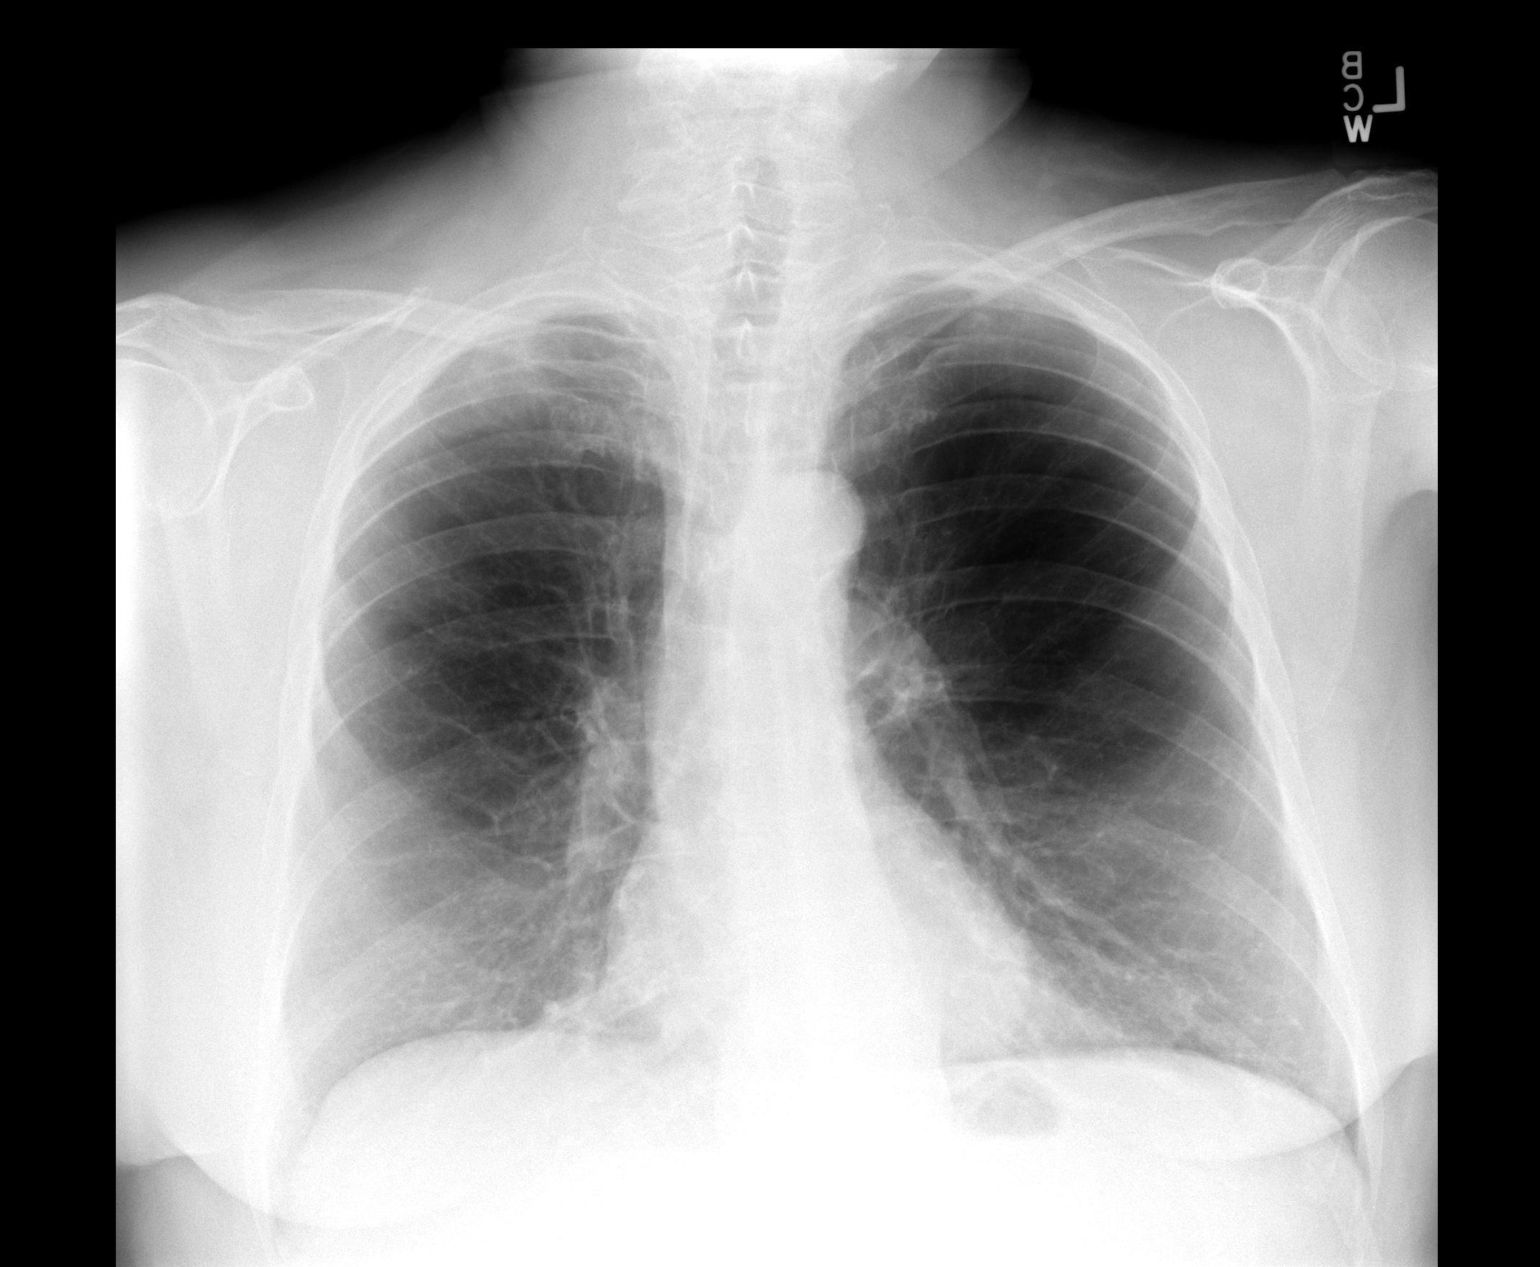

[view not recorded (2 of 2)]
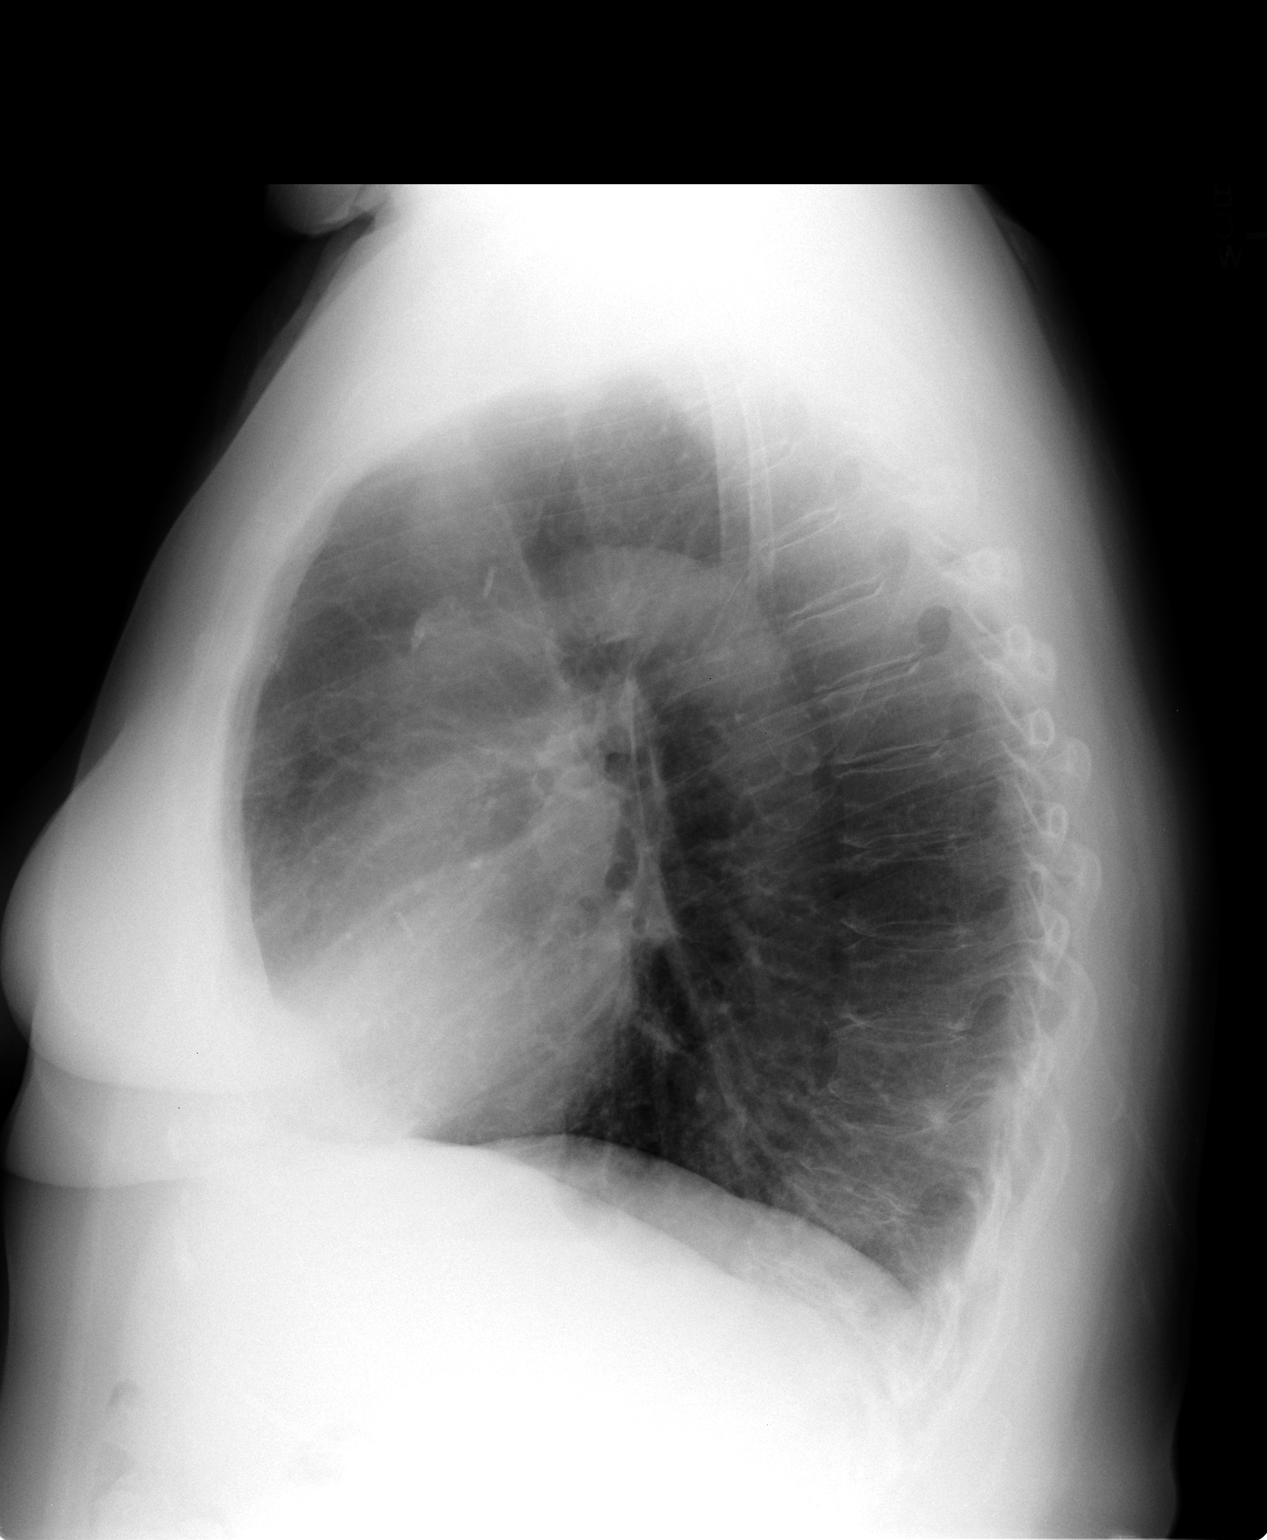

[2 of 2 positions shown; findings below may reference images not displayed]

FINDINGS: Lungs are adequately inflated without focal airspace consolidation
or effusion. There is increased lucency over the mid to upper lungs
compatible with that emphysematous disease. Cardiomediastinal
silhouette is within normal. There is minimal calcified plaque over
the thoracic aorta. Remainder of the exam is unchanged.
IMPRESSION: No active cardiopulmonary disease.

Emphysematous disease.
